# Patient Record
Sex: Male | Born: 1935 | Race: White | Hispanic: No | Marital: Married | Smoking: Former smoker
Health system: Southern US, Community
[De-identification: ages and names within clinical notes are randomized; demographics above are authoritative.]

## PROBLEM LIST (undated history)

## (undated) DIAGNOSIS — I639 Cerebral infarction, unspecified: Secondary | ICD-10-CM

## (undated) DIAGNOSIS — I10 Essential (primary) hypertension: Secondary | ICD-10-CM

---
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
## 2004-06-29 ENCOUNTER — Ambulatory Visit: Payer: Self-pay | Admitting: Gastroenterology

## 2007-03-14 ENCOUNTER — Ambulatory Visit: Payer: Self-pay | Admitting: Internal Medicine

## 2011-02-11 ENCOUNTER — Inpatient Hospital Stay: Payer: Self-pay | Admitting: Internal Medicine

## 2011-02-18 ENCOUNTER — Emergency Department: Payer: Self-pay | Admitting: *Deleted

## 2011-02-23 ENCOUNTER — Emergency Department: Payer: Self-pay | Admitting: Emergency Medicine

## 2011-04-05 ENCOUNTER — Ambulatory Visit: Payer: Self-pay | Admitting: Gastroenterology

## 2011-06-06 DIAGNOSIS — I639 Cerebral infarction, unspecified: Secondary | ICD-10-CM

## 2011-06-06 HISTORY — DX: Cerebral infarction, unspecified: I63.9

## 2011-07-03 ENCOUNTER — Ambulatory Visit: Payer: Self-pay | Admitting: Internal Medicine

## 2011-07-12 ENCOUNTER — Emergency Department: Payer: Self-pay | Admitting: Emergency Medicine

## 2011-07-12 LAB — URINALYSIS, COMPLETE
Ketone: NEGATIVE
Nitrite: NEGATIVE
Ph: 6 (ref 4.5–8.0)
Protein: NEGATIVE
RBC,UR: 23 /HPF (ref 0–5)
Specific Gravity: 1.01 (ref 1.003–1.030)
Squamous Epithelial: <1

## 2011-07-12 LAB — COMPREHENSIVE METABOLIC PANEL
Albumin: 3.3 g/dL — ABNORMAL LOW (ref 3.4–5.0)
Alkaline Phosphatase: 55 U/L (ref 50–136)
Anion Gap: 9 (ref 7–16)
Bilirubin,Total: 0.6 mg/dL (ref 0.2–1.0)
Chloride: 107 mmol/L (ref 98–107)
Co2: 26 mmol/L (ref 21–32)
Creatinine: 1.17 mg/dL (ref 0.60–1.30)
EGFR (Non-African Amer.): >60
Glucose: 99 mg/dL (ref 65–99)
SGPT (ALT): 21 U/L
Sodium: 142 mmol/L (ref 136–145)
Total Protein: 6.1 g/dL — ABNORMAL LOW (ref 6.4–8.2)

## 2011-07-12 LAB — CBC
MCV: 92 fL (ref 80–100)
Platelet: 229 10*3/uL (ref 150–440)
RDW: 13.1 % (ref 11.5–14.5)

## 2011-07-12 LAB — TROPONIN I: Troponin-I: <0.02 ng/mL

## 2011-07-18 ENCOUNTER — Ambulatory Visit: Payer: Self-pay | Admitting: Internal Medicine

## 2011-12-27 DIAGNOSIS — R109 Unspecified abdominal pain: Secondary | ICD-10-CM | POA: Insufficient documentation

## 2012-01-01 ENCOUNTER — Ambulatory Visit: Payer: Self-pay | Admitting: Internal Medicine

## 2012-03-29 ENCOUNTER — Ambulatory Visit: Payer: Self-pay | Admitting: Internal Medicine

## 2012-07-26 ENCOUNTER — Ambulatory Visit: Payer: Self-pay | Admitting: Internal Medicine

## 2012-07-26 DIAGNOSIS — L309 Dermatitis, unspecified: Secondary | ICD-10-CM | POA: Insufficient documentation

## 2012-10-10 ENCOUNTER — Ambulatory Visit: Payer: Self-pay | Admitting: Internal Medicine

## 2012-11-08 ENCOUNTER — Ambulatory Visit: Payer: Self-pay | Admitting: Internal Medicine

## 2013-01-10 DIAGNOSIS — I10 Essential (primary) hypertension: Secondary | ICD-10-CM | POA: Insufficient documentation

## 2013-03-05 HISTORY — PX: ESOPHAGOGASTRODUODENOSCOPY ENDOSCOPY: SHX5814

## 2013-03-19 DIAGNOSIS — R1310 Dysphagia, unspecified: Secondary | ICD-10-CM | POA: Insufficient documentation

## 2013-03-25 ENCOUNTER — Ambulatory Visit: Payer: Self-pay | Admitting: Gastroenterology

## 2013-04-09 DIAGNOSIS — H9190 Unspecified hearing loss, unspecified ear: Secondary | ICD-10-CM | POA: Insufficient documentation

## 2013-05-15 ENCOUNTER — Ambulatory Visit: Payer: Self-pay | Admitting: Urology

## 2013-11-10 DIAGNOSIS — M19019 Primary osteoarthritis, unspecified shoulder: Secondary | ICD-10-CM | POA: Insufficient documentation

## 2013-11-10 DIAGNOSIS — M7580 Other shoulder lesions, unspecified shoulder: Secondary | ICD-10-CM | POA: Insufficient documentation

## 2016-01-26 ENCOUNTER — Telehealth: Payer: Self-pay | Admitting: Gastroenterology

## 2016-01-26 ENCOUNTER — Encounter: Payer: Self-pay | Admitting: Gastroenterology

## 2016-01-26 NOTE — Telephone Encounter (Signed)
Called patient, LVM

## 2016-01-26 NOTE — Telephone Encounter (Signed)
EGD

## 2016-01-26 NOTE — Telephone Encounter (Signed)
error 

## 2016-01-26 NOTE — Telephone Encounter (Signed)
Patient is returning your call regarding an EGD and would prefer [place] if possible.

## 2016-01-27 ENCOUNTER — Other Ambulatory Visit: Payer: Self-pay

## 2016-01-27 DIAGNOSIS — I639 Cerebral infarction, unspecified: Secondary | ICD-10-CM | POA: Insufficient documentation

## 2016-01-27 DIAGNOSIS — N200 Calculus of kidney: Secondary | ICD-10-CM | POA: Insufficient documentation

## 2016-01-27 DIAGNOSIS — E7800 Pure hypercholesterolemia, unspecified: Secondary | ICD-10-CM | POA: Insufficient documentation

## 2016-01-27 DIAGNOSIS — I10 Essential (primary) hypertension: Secondary | ICD-10-CM | POA: Insufficient documentation

## 2016-01-27 DIAGNOSIS — R918 Other nonspecific abnormal finding of lung field: Secondary | ICD-10-CM | POA: Insufficient documentation

## 2016-01-27 NOTE — Telephone Encounter (Signed)
Gastroenterology Pre-Procedure Review  Request Date: 02/24/2016 Requesting Physician:   PATIENT REVIEW QUESTIONS: The patient responded to the following health history questions as indicated:    1. Are you having any GI issues? no 2. Do you have a personal history of Polyps? no 3. Do you have a family history of Colon Cancer or Polyps? no 4. Diabetes Mellitus? no 5. Joint replacements in the past 12 months?no 6. Major health problems in the past 3 months?no 7. Any artificial heart valves, MVP, or defibrillator?no    MEDICATIONS & ALLERGIES:    Patient reports the following regarding taking any anticoagulation/antiplatelet therapy:   Plavix, Coumadin, Eliquis, Xarelto, Lovenox, Pradaxa, Brilinta, or Effient? no Aspirin? yes (Blood thinner)  Patient confirms/reports the following medications:  Current Outpatient Prescriptions  Medication Sig Dispense Refill  . aspirin EC 81 MG tablet Take by mouth.    Marland Kitchen atorvastatin (LIPITOR) 10 MG tablet Take by mouth.    . desoximetasone (TOPICORT) 0.25 % cream Apply 1 application topically 2 (two) times daily.    . furosemide (LASIX) 20 MG tablet Take 20 mg by mouth.    . loratadine (CLARITIN) 5 MG chewable tablet Chew by mouth.    . metoprolol tartrate (LOPRESSOR) 25 MG tablet Take by mouth.    . potassium chloride (KLOR-CON) 20 MEQ packet Take by mouth 2 (two) times daily.    . vitamin B-12 (CYANOCOBALAMIN) 100 MCG tablet Take by mouth.     No current facility-administered medications for this visit.     Patient confirms/reports the following allergies:  Allergies  Allergen Reactions  . Finasteride Nausea Only    confusion  . Lisinopril Other (See Comments)    Confusion    No orders of the defined types were placed in this encounter.   AUTHORIZATION INFORMATION Primary Insurance: 1D#: Group #:  Secondary Insurance: 1D#: Group #:  SCHEDULE INFORMATION: Date: 02/24/2016 Time: Location: [facility]

## 2016-01-27 NOTE — Telephone Encounter (Signed)
Dysphagia R13.10 [facility] 02/24/2016 Please pre-cert

## 2016-01-27 NOTE — Telephone Encounter (Signed)
Patient returning your call regarding an appointment

## 2016-02-17 ENCOUNTER — Encounter: Payer: Self-pay | Admitting: *Deleted

## 2016-02-22 NOTE — Discharge Instructions (Signed)

## 2016-02-24 ENCOUNTER — Ambulatory Visit
Admission: RE | Admit: 2016-02-24 | Discharge: 2016-02-24 | Disposition: A | Discharge status: Home / Self Care | Payer: Medicare Other | Source: Ambulatory Visit | Attending: Gastroenterology | Admitting: Gastroenterology

## 2016-02-24 ENCOUNTER — Encounter: Payer: Self-pay | Admitting: *Deleted

## 2016-02-24 ENCOUNTER — Ambulatory Visit: Payer: Medicare Other | Admitting: Student in an Organized Health Care Education/Training Program

## 2016-02-24 ENCOUNTER — Encounter
Admission: RE | Disposition: A | Discharge status: Home / Self Care | Payer: Self-pay | Source: Ambulatory Visit | Attending: Gastroenterology

## 2016-02-24 DIAGNOSIS — K222 Esophageal obstruction: Secondary | ICD-10-CM

## 2016-02-24 DIAGNOSIS — I10 Essential (primary) hypertension: Secondary | ICD-10-CM | POA: Insufficient documentation

## 2016-02-24 DIAGNOSIS — K449 Diaphragmatic hernia without obstruction or gangrene: Secondary | ICD-10-CM | POA: Diagnosis not present

## 2016-02-24 DIAGNOSIS — R1310 Dysphagia, unspecified: Secondary | ICD-10-CM | POA: Insufficient documentation

## 2016-02-24 DIAGNOSIS — Z79899 Other long term (current) drug therapy: Secondary | ICD-10-CM | POA: Diagnosis not present

## 2016-02-24 DIAGNOSIS — Z8673 Personal history of transient ischemic attack (TIA), and cerebral infarction without residual deficits: Secondary | ICD-10-CM | POA: Diagnosis not present

## 2016-02-24 DIAGNOSIS — Z7982 Long term (current) use of aspirin: Secondary | ICD-10-CM | POA: Diagnosis not present

## 2016-02-24 HISTORY — DX: Cerebral infarction, unspecified: I63.9

## 2016-02-24 HISTORY — PX: ESOPHAGEAL DILATION: SHX303

## 2016-02-24 HISTORY — DX: Essential (primary) hypertension: I10

## 2016-02-24 HISTORY — PX: ESOPHAGOGASTRODUODENOSCOPY (EGD) WITH PROPOFOL: SHX5813

## 2016-02-24 SURGERY — ESOPHAGOGASTRODUODENOSCOPY (EGD) WITH PROPOFOL
Anesthesia: Monitor Anesthesia Care | Wound class: Clean Contaminated

## 2016-02-24 MED ORDER — LACTATED RINGERS IV SOLN
INTRAVENOUS | Status: DC
  Administered 2016-02-24: 12:00:00 via INTRAVENOUS

## 2016-02-24 MED ORDER — PROPOFOL 10 MG/ML IV BOLUS
INTRAVENOUS | Status: DC | PRN
  Administered 2016-02-24: 20 mg via INTRAVENOUS
  Administered 2016-02-24: 50 mg via INTRAVENOUS
  Administered 2016-02-24: 30 mg via INTRAVENOUS
  Administered 2016-02-24: 10 mg via INTRAVENOUS
  Administered 2016-02-24: 20 mg via INTRAVENOUS

## 2016-02-24 MED ORDER — LIDOCAINE HCL (CARDIAC) 20 MG/ML IV SOLN
INTRAVENOUS | Status: DC | PRN
  Administered 2016-02-24: 50 mg via INTRAVENOUS

## 2016-02-24 MED ORDER — OXYCODONE HCL 5 MG/5ML PO SOLN: 5.0000 mg | Freq: Once | ORAL | Status: DC | PRN

## 2016-02-24 MED ORDER — GLYCOPYRROLATE 0.2 MG/ML IJ SOLN
INTRAMUSCULAR | Status: DC | PRN
  Administered 2016-02-24: 0.2 mg via INTRAVENOUS

## 2016-02-24 MED ORDER — OXYCODONE HCL 5 MG PO TABS: 5.0000 mg | ORAL_TABLET | Freq: Once | ORAL | Status: DC | PRN

## 2016-02-24 SURGICAL SUPPLY — 32 items
BALLN DILATOR 10-12 8 (BALLOONS)
BALLN DILATOR 12-15 8 (BALLOONS)
BALLN DILATOR 15-18 8 (BALLOONS) ×4
BALLN DILATOR CRE 0-12 8 (BALLOONS)
BALLN DILATOR ESOPH 8 10 CRE (MISCELLANEOUS) IMPLANT
BALLOON DILATOR 12-15 8 (BALLOONS) IMPLANT
BALLOON DILATOR 15-18 8 (BALLOONS) ×2 IMPLANT
BALLOON DILATOR CRE 0-12 8 (BALLOONS) IMPLANT
BLOCK BITE 60FR ADLT L/F GRN (MISCELLANEOUS) ×4 IMPLANT
CANISTER SUCT 1200ML W/VALVE (MISCELLANEOUS) ×4 IMPLANT
CLIP HMST 235XBRD CATH ROT (MISCELLANEOUS) IMPLANT
CLIP RESOLUTION 360 11X235 (MISCELLANEOUS)
FCP ESCP3.2XJMB 240X2.8X (MISCELLANEOUS)
FORCEPS BIOP RAD 4 LRG CAP 4 (CUTTING FORCEPS) IMPLANT
FORCEPS BIOP RJ4 240 W/NDL (MISCELLANEOUS)
FORCEPS ESCP3.2XJMB 240X2.8X (MISCELLANEOUS) IMPLANT
GOWN CVR UNV OPN BCK APRN NK (MISCELLANEOUS) ×4 IMPLANT
GOWN ISOL THUMB LOOP REG UNIV (MISCELLANEOUS) ×4
INJECTOR VARIJECT VIN23 (MISCELLANEOUS) IMPLANT
KIT DEFENDO VALVE AND CONN (KITS) IMPLANT
KIT ENDO PROCEDURE OLY (KITS) ×4 IMPLANT
MARKER SPOT ENDO TATTOO 5ML (MISCELLANEOUS) IMPLANT
PAD GROUND ADULT SPLIT (MISCELLANEOUS) IMPLANT
RETRIEVER NET PLAT FOOD (MISCELLANEOUS) IMPLANT
SNARE SHORT THROW 13M SML OVAL (MISCELLANEOUS) IMPLANT
SNARE SHORT THROW 30M LRG OVAL (MISCELLANEOUS) IMPLANT
SPOT EX ENDOSCOPIC TATTOO (MISCELLANEOUS)
SYR INFLATION 60ML (SYRINGE) ×4 IMPLANT
TRAP ETRAP POLY (MISCELLANEOUS) IMPLANT
VARIJECT INJECTOR VIN23 (MISCELLANEOUS)
WATER STERILE IRR 250ML POUR (IV SOLUTION) ×4 IMPLANT
WIRE CRE 18-20MM 8CM F G (MISCELLANEOUS) IMPLANT

## 2016-02-24 NOTE — Anesthesia Preprocedure Evaluation (Signed)
Anesthesia Evaluation  Patient identified by MRN, date of birth, ID band  Reviewed: NPO status   History of Anesthesia Complications Negative for: history of anesthetic complications  Airway Mallampati: II  TM Distance: >3 FB Neck ROM: full    Dental  (+) Missing,    Pulmonary  PTX 1953 > thoracotomy;   Pulmonary exam normal        Cardiovascular hypertension, Normal cardiovascular exam     Neuro/Psych HOHearing TIA (2014 )negative psych ROS   GI/Hepatic negative GI ROS, Neg liver ROS,   Endo/Other  negative endocrine ROS  Renal/GU negative Renal ROS  negative genitourinary   Musculoskeletal   Abdominal   Peds  Hematology negative hematology ROS (+)   Anesthesia Other Findings   Reproductive/Obstetrics                             Anesthesia Physical Anesthesia Plan  ASA: II  Anesthesia Plan: MAC   Post-op Pain Management:    Induction:   Airway Management Planned:   Additional Equipment:   Intra-op Plan:   Post-operative Plan:   Informed Consent: I have reviewed the patients History and Physical, chart, labs and discussed the procedure including the risks, benefits and alternatives for the proposed anesthesia with the patient or authorized representative who has indicated his/her understanding and acceptance.     Plan Discussed with: CRNA  Anesthesia Plan Comments:         Anesthesia Quick Evaluation

## 2016-02-24 NOTE — H&P (Signed)
  Midge Minium, MD [facility] [address], [address] [place], [place] [address] Phone: [phone] Fax : [phone]  Primary Care Physician:  Rolm Gala, MD Primary Gastroenterologist:  Dr. Servando Snare  Pre-Procedure History & Physical: HPI:  Matthew Greer is a 82 y.o. male is here for an endoscopy.   Past Medical History:  Diagnosis Date  . Hypertension   . Stroke [facility]) 2013   "minor" - no deficits    Past Surgical History:  Procedure Laterality Date  . ESOPHAGOGASTRODUODENOSCOPY ENDOSCOPY  03/2013   Dr. Servando Snare  . THORACOTOMY  1953    Prior to Admission medications   Medication Sig Start Date End Date Taking? Authorizing Provider  acetaminophen (TYLENOL) 325 MG tablet Take 650 mg by mouth every 6 (six) hours as needed.   Yes Historical Provider, MD  aspirin EC 81 MG tablet Take by mouth.   Yes Historical Provider, MD  atorvastatin (LIPITOR) 10 MG tablet Take by mouth. 06/10/15 06/09/16 Yes Historical Provider, MD  desoximetasone (TOPICORT) 0.25 % cream Apply 1 application topically 2 (two) times daily.   Yes Historical Provider, MD  furosemide (LASIX) 20 MG tablet Take 20 mg by mouth.   Yes Historical Provider, MD  loratadine (CLARITIN) 5 MG chewable tablet Chew by mouth.   Yes Historical Provider, MD  metoprolol tartrate (LOPRESSOR) 25 MG tablet Take by mouth. 06/18/15 06/09/16 Yes Historical Provider, MD  potassium chloride (KLOR-CON) 20 MEQ packet Take by mouth 2 (two) times daily.   Yes Historical Provider, MD    Allergies as of 01/27/2016 - Review Complete 01/27/2016  Allergen Reaction Noted  . Finasteride Nausea Only 10/21/2013  . Lisinopril Other (See Comments) 11/10/2013    History reviewed. No pertinent family history.  Social History   Social History  . Marital status: Married    Spouse name: N/A  . Number of children: N/A  . Years of education: N/A   Occupational History  . Not on file.   Social History Main Topics  . Smoking status: Never Smoker  . Smokeless  tobacco: Never Used  . Alcohol use No  . Drug use: Unknown  . Sexual activity: Not on file   Other Topics Concern  . Not on file   Social History Narrative  . No narrative on file    Review of Systems: See HPI, otherwise negative ROS  Physical Exam: BP (!) 140/103   Pulse 85   Temp 98.2 F (36.8 C)   Resp 16   Ht 5\' 11"  (1.803 m)   Wt 188 lb (85.3 kg)   SpO2 96%   BMI 26.22 kg/m  General:   Alert,  pleasant and cooperative in NAD Head:  Normocephalic and atraumatic. Neck:  Supple; no masses or thyromegaly. Lungs:  Clear throughout to auscultation.    Heart:  Regular rate and rhythm. Abdomen:  Soft, nontender and nondistended. Normal bowel sounds, without guarding, and without rebound.   Neurologic:  Alert and  oriented x4;  grossly normal neurologically.  Impression/Plan: Matthew Greer is here for an endoscopy to be performed for Dysphagia  Risks, benefits, limitations, and alternatives regarding  endoscopy have been reviewed with the patient.  Questions have been answered.  All parties agreeable.   Midge Minium, MD  02/24/2016, 11:57 AM

## 2016-02-24 NOTE — Transfer of Care (Signed)
Immediate Anesthesia Transfer of Care Note  Patient: Matthew Greer  Procedure(s) Performed: Procedure(s): ESOPHAGOGASTRODUODENOSCOPY (EGD) WITH PROPOFOL (N/A) ESOPHAGEAL DILATION  Patient Location: PACU  Anesthesia Type: MAC  Level of Consciousness: awake, alert  and patient cooperative  Airway and Oxygen Therapy: Patient Spontanous Breathing and Patient connected to supplemental oxygen  Post-op Assessment: Post-op Vital signs reviewed, Patient's Cardiovascular Status Stable, Respiratory Function Stable, Patent Airway and No signs of Nausea or vomiting  Post-op Vital Signs: Reviewed and stable  Complications: No apparent anesthesia complications

## 2016-02-24 NOTE — Op Note (Signed)
[facility] Patient Name: Matthew Greer Procedure Date: 02/24/2016 11:57 AM MRN: [id number] Account #: [id number] Date of Birth: 1925/11/14 Admit Type: Outpatient Age: 82 Room: [facility] OR ROOM 01 Gender: Male Note Status: Finalized Procedure:            Upper GI endoscopy Indications:          Dysphagia Providers:            Midge Minium MD, MD Referring MD:         Letitia Caul, MD (Referring MD) Medicines:            Propofol per Anesthesia Complications:        No immediate complications. Procedure:            Pre-Anesthesia Assessment:                       - Prior to the procedure, a History and Physical was                        performed, and patient medications and allergies were                        reviewed. The patient's tolerance of previous                        anesthesia was also reviewed. The risks and benefits of                        the procedure and the sedation options and risks were                        discussed with the patient. All questions were                        answered, and informed consent was obtained. Prior                        Anticoagulants: The patient has taken no previous                        anticoagulant or antiplatelet agents. ASA Grade                        Assessment: II - A patient with mild systemic disease.                        After reviewing the risks and benefits, the patient was                        deemed in satisfactory condition to undergo the                        procedure.                       After obtaining informed consent, the endoscope was                        passed under direct vision. Throughout the procedure,  the patient's blood pressure, pulse, and oxygen                        saturations were monitored continuously. The Olympus                        [id number]-HQ190 Endoscope (S#. [id number]) was introduced                        through the  mouth, and advanced to the second part of                        duodenum. The upper GI endoscopy was accomplished                        without difficulty. The patient tolerated the procedure                        well. Findings:      One moderate benign-appearing, intrinsic stenosis was found at the       gastroesophageal junction. And was traversed. A TTS dilator was passed       through the scope. Dilation with a 15-16.5-18 mm balloon dilator was       performed to 16.5 mm. The dilation site was examined following endoscope       reinsertion and showed moderate improvement in luminal narrowing.      A medium-sized hiatal hernia was present.      The stomach was normal.      The examined duodenum was normal. Impression:           - Benign-appearing esophageal stenosis. Dilated.                       - Medium-sized hiatal hernia.                       - Normal stomach.                       - Normal examined duodenum.                       - No specimens collected. Recommendation:       - Discharge patient to home.                       - Resume previous diet.                       - Continue present medications.                       - Await pathology results. Procedure Code(s):    --- Professional ---                       [phone], Esophagogastroduodenoscopy, flexible, transoral;                        with transendoscopic balloon dilation of esophagus                        (less than 30 mm diameter) Diagnosis Code(s):    --- Professional ---  R13.10, Dysphagia, unspecified                       K22.2, Esophageal obstruction CPT copyright 2016 American Medical Association. All rights reserved. The codes documented in this report are preliminary and upon coder review may  be revised to meet current compliance requirements. Midge Miniumarren Luka Reisch MD, MD 02/24/2016 12:14:18 PM This report has been signed electronically. Number of Addenda: 0 Note Initiated On: 02/24/2016  11:57 AM      [facility]

## 2016-02-24 NOTE — Anesthesia Postprocedure Evaluation (Signed)
Anesthesia Post Note  Patient: Matthew Greer  Procedure(s) Performed: Procedure(s) (LRB): ESOPHAGOGASTRODUODENOSCOPY (EGD) WITH PROPOFOL (N/A) ESOPHAGEAL DILATION  Patient location during evaluation: PACU Anesthesia Type: MAC Level of consciousness: awake and alert Pain management: pain level controlled Vital Signs Assessment: post-procedure vital signs reviewed and stable Respiratory status: spontaneous breathing, nonlabored ventilation, respiratory function stable and patient connected to nasal cannula oxygen Cardiovascular status: stable and blood pressure returned to baseline Anesthetic complications: no    Casara Perrier

## 2016-02-24 NOTE — Anesthesia Procedure Notes (Signed)
Procedure Name: MAC Date/Time: 02/24/2016 12:01 PM Performed by: Maryan Rued Pre-anesthesia Checklist: Patient identified, Emergency Drugs available, Suction available and Patient being monitored Patient Re-evaluated:Patient Re-evaluated prior to inductionOxygen Delivery Method: Nasal cannula

## 2016-02-25 ENCOUNTER — Encounter: Payer: Self-pay | Admitting: Gastroenterology

## 2016-05-25 ENCOUNTER — Emergency Department: Payer: Medicare Other

## 2016-05-25 ENCOUNTER — Inpatient Hospital Stay
Admission: EM | Admit: 2016-05-25 | Discharge: 2016-05-28 | DRG: 066 | Disposition: A | Discharge status: Skilled Nursing Facility | Payer: Medicare Other | Attending: Internal Medicine | Admitting: Internal Medicine

## 2016-05-25 ENCOUNTER — Encounter: Payer: Self-pay | Admitting: *Deleted

## 2016-05-25 DIAGNOSIS — Z823 Family history of stroke: Secondary | ICD-10-CM | POA: Diagnosis not present

## 2016-05-25 DIAGNOSIS — Z888 Allergy status to other drugs, medicaments and biological substances status: Secondary | ICD-10-CM

## 2016-05-25 DIAGNOSIS — I10 Essential (primary) hypertension: Secondary | ICD-10-CM | POA: Diagnosis present

## 2016-05-25 DIAGNOSIS — R278 Other lack of coordination: Secondary | ICD-10-CM

## 2016-05-25 DIAGNOSIS — Z7982 Long term (current) use of aspirin: Secondary | ICD-10-CM

## 2016-05-25 DIAGNOSIS — Z8673 Personal history of transient ischemic attack (TIA), and cerebral infarction without residual deficits: Secondary | ICD-10-CM | POA: Diagnosis not present

## 2016-05-25 DIAGNOSIS — I69359 Hemiplegia and hemiparesis following cerebral infarction affecting unspecified side: Secondary | ICD-10-CM

## 2016-05-25 DIAGNOSIS — E785 Hyperlipidemia, unspecified: Secondary | ICD-10-CM | POA: Diagnosis present

## 2016-05-25 DIAGNOSIS — Z8249 Family history of ischemic heart disease and other diseases of the circulatory system: Secondary | ICD-10-CM | POA: Diagnosis not present

## 2016-05-25 DIAGNOSIS — I63312 Cerebral infarction due to thrombosis of left middle cerebral artery: Secondary | ICD-10-CM | POA: Diagnosis not present

## 2016-05-25 DIAGNOSIS — R29898 Other symptoms and signs involving the musculoskeletal system: Secondary | ICD-10-CM | POA: Diagnosis not present

## 2016-05-25 DIAGNOSIS — R29701 NIHSS score 1: Secondary | ICD-10-CM | POA: Diagnosis present

## 2016-05-25 DIAGNOSIS — R4781 Slurred speech: Secondary | ICD-10-CM | POA: Diagnosis present

## 2016-05-25 DIAGNOSIS — I639 Cerebral infarction, unspecified: Principal | ICD-10-CM | POA: Diagnosis present

## 2016-05-25 DIAGNOSIS — M6281 Muscle weakness (generalized): Secondary | ICD-10-CM

## 2016-05-25 DIAGNOSIS — R4701 Aphasia: Secondary | ICD-10-CM | POA: Diagnosis present

## 2016-05-25 DIAGNOSIS — Z87891 Personal history of nicotine dependence: Secondary | ICD-10-CM | POA: Diagnosis not present

## 2016-05-25 LAB — APTT: aPTT: 35 seconds (ref 24–36)

## 2016-05-25 LAB — COMPREHENSIVE METABOLIC PANEL
ALK PHOS: 76 U/L (ref 38–126)
ALT: 17 U/L (ref 17–63)
ANION GAP: 5 (ref 5–15)
AST: 22 U/L (ref 15–41)
Albumin: 4.1 g/dL (ref 3.5–5.0)
BUN: 21 mg/dL — ABNORMAL HIGH (ref 6–20)
CHLORIDE: 106 mmol/L (ref 101–111)
CO2: 28 mmol/L (ref 22–32)
Calcium: 9.8 mg/dL (ref 8.9–10.3)
Creatinine, Ser: 1.28 mg/dL — ABNORMAL HIGH (ref 0.61–1.24)
GFR calc non Af Amer: 48 mL/min — ABNORMAL LOW (ref >60)
GFR, EST AFRICAN AMERICAN: 56 mL/min — AB (ref >60)
GLUCOSE: 100 mg/dL — AB (ref 65–99)
Potassium: 4.1 mmol/L (ref 3.5–5.1)
SODIUM: 139 mmol/L (ref 135–145)
Total Bilirubin: 0.4 mg/dL (ref 0.3–1.2)
Total Protein: 6.7 g/dL (ref 6.5–8.1)

## 2016-05-25 LAB — TROPONIN I: Troponin I: <0.03 ng/mL (ref <0.03)

## 2016-05-25 LAB — DIFFERENTIAL
BASOS PCT: 1 %
Basophils Absolute: 0.1 10*3/uL (ref 0–0.1)
EOS PCT: 1 %
Eosinophils Absolute: 0.1 10*3/uL (ref 0–0.7)
LYMPHS PCT: 23 %
Lymphs Abs: 2.3 10*3/uL (ref 1.0–3.6)
MONO ABS: 0.8 10*3/uL (ref 0.2–1.0)
Monocytes Relative: 8 %
NEUTROS ABS: 6.5 10*3/uL (ref 1.4–6.5)
NEUTROS PCT: 67 %

## 2016-05-25 LAB — GLUCOSE, CAPILLARY: Glucose-Capillary: 100 mg/dL — ABNORMAL HIGH (ref 65–99)

## 2016-05-25 LAB — CBC
HCT: 42.9 % (ref 40.0–52.0)
Hemoglobin: 14.3 g/dL (ref 13.0–18.0)
MCH: 30.2 pg (ref 26.0–34.0)
MCHC: 33.4 g/dL (ref 32.0–36.0)
MCV: 90.4 fL (ref 80.0–100.0)
PLATELETS: 272 10*3/uL (ref 150–440)
RBC: 4.74 MIL/uL (ref 4.40–5.90)
RDW: 13.6 % (ref 11.5–14.5)
WBC: 9.8 10*3/uL (ref 3.8–10.6)

## 2016-05-25 LAB — ETHANOL: Alcohol, Ethyl (B): <5 mg/dL (ref <5)

## 2016-05-25 LAB — PROTIME-INR
INR: 0.98
PROTHROMBIN TIME: 13 s (ref 11.4–15.2)

## 2016-05-25 MED ORDER — ACETAMINOPHEN 650 MG RE SUPP: 650.0000 mg | RECTAL | Status: DC | PRN

## 2016-05-25 MED ORDER — ENOXAPARIN SODIUM 40 MG/0.4ML ~~LOC~~ SOLN
40.0000 mg | SUBCUTANEOUS | Status: DC
  Administered 2016-05-25 – 2016-05-27 (×3): 40 mg via SUBCUTANEOUS
  Filled 2016-05-25 (×3): qty 0.4

## 2016-05-25 MED ORDER — LORATADINE 10 MG PO TABS
5.0000 mg | ORAL_TABLET | Freq: Every day | ORAL | Status: DC
  Administered 2016-05-26 – 2016-05-28 (×3): 5 mg via ORAL
  Filled 2016-05-25 (×3): qty 1

## 2016-05-25 MED ORDER — ASPIRIN EC 81 MG PO TBEC
81.0000 mg | DELAYED_RELEASE_TABLET | Freq: Every day | ORAL | Status: DC
  Administered 2016-05-26 – 2016-05-28 (×3): 81 mg via ORAL
  Filled 2016-05-25 (×3): qty 1

## 2016-05-25 MED ORDER — ACETAMINOPHEN 325 MG PO TABS: 650.0000 mg | ORAL_TABLET | Freq: Four times a day (QID) | ORAL | Status: DC | PRN

## 2016-05-25 MED ORDER — CLOPIDOGREL BISULFATE 75 MG PO TABS
75.0000 mg | ORAL_TABLET | Freq: Every day | ORAL | Status: DC
  Administered 2016-05-25 – 2016-05-28 (×4): 75 mg via ORAL
  Filled 2016-05-25 (×4): qty 1

## 2016-05-25 MED ORDER — METOPROLOL TARTRATE 25 MG PO TABS
25.0000 mg | ORAL_TABLET | Freq: Two times a day (BID) | ORAL | Status: DC
  Administered 2016-05-25 – 2016-05-26 (×3): 25 mg via ORAL
  Filled 2016-05-25 (×3): qty 1

## 2016-05-25 MED ORDER — ATORVASTATIN CALCIUM 20 MG PO TABS
80.0000 mg | ORAL_TABLET | Freq: Every day | ORAL | Status: DC
  Administered 2016-05-26 – 2016-05-27 (×2): 80 mg via ORAL
  Filled 2016-05-25 (×2): qty 4

## 2016-05-25 MED ORDER — ACETAMINOPHEN 160 MG/5ML PO SOLN: 650.0000 mg | ORAL | Status: DC | PRN

## 2016-05-25 MED ORDER — ACETAMINOPHEN 325 MG PO TABS: 650.0000 mg | ORAL_TABLET | ORAL | Status: DC | PRN

## 2016-05-25 MED ORDER — SENNOSIDES-DOCUSATE SODIUM 8.6-50 MG PO TABS: 1.0000 | ORAL_TABLET | Freq: Every evening | ORAL | Status: DC | PRN

## 2016-05-25 MED ORDER — STROKE: EARLY STAGES OF RECOVERY BOOK
Freq: Once | Status: AC
  Administered 2016-05-25: 1

## 2016-05-25 NOTE — H&P (Signed)
[employer] - [place] [facility]   PATIENT NAME: Matthew Greer    MR#:  [id number]  DATE OF BIRTH:  [birth year]  DATE OF ADMISSION:  05/25/2016  PRIMARY CARE PHYSICIAN: Rolm Gala, MD   REQUESTING/REFERRING PHYSICIAN: Roxan Hockey MD  CHIEF COMPLAINT:   Chief Complaint  Patient presents with  . Code Stroke    HISTORY OF PRESENT ILLNESS: Matthew Greer  is a 82 y.o. male with a known history of " Minor strokes" was presented to the ED earlier with acute onset of weakness in his upper extremities and right sided worse. Also slurred speech. His symptoms were not severe. Therefore a neurology consult was obtained they recommended a MRI of the brain which confirmed a small CVA. Patient continues to complain of weakness in his upper extremities. Some slurred speech. He otherwise denies any difficulty with swallowing no chest pain or shortness of breath.     AST MEDICAL HISTORY:   Past Medical History:  Diagnosis Date  . Hypertension   . Stroke [facility]) 2013   "minor" - no deficits    PAST SURGICAL HISTORY: Past Surgical History:  Procedure Laterality Date  . ESOPHAGEAL DILATION  02/24/2016   Procedure: ESOPHAGEAL DILATION;  Surgeon: Midge Minium, MD;  Location: [facility];  Service: Endoscopy;;  . ESOPHAGOGASTRODUODENOSCOPY (EGD) WITH PROPOFOL N/A 02/24/2016   Procedure: ESOPHAGOGASTRODUODENOSCOPY (EGD) WITH PROPOFOL;  Surgeon: Midge Minium, MD;  Location: [facility];  Service: Endoscopy;  Laterality: N/A;  . ESOPHAGOGASTRODUODENOSCOPY ENDOSCOPY  03/2013   Dr. Servando Snare  . THORACOTOMY  1953    SOCIAL HISTORY:  Social History  Substance Use Topics  . Smoking status: Never Smoker  . Smokeless tobacco: Never Used  . Alcohol use No    FAMILY HISTORY: No family history on file.  DRUG ALLERGIES:  Allergies  Allergen Reactions  . Finasteride Nausea Only    confusion  . Lisinopril Other (See Comments)    Confusion    REVIEW OF SYSTEMS:    CONSTITUTIONAL: No fever, fatigue or weakness.  EYES: No blurred or double vision.  EARS, NOSE, AND THROAT: No tinnitus or ear pain.  RESPIRATORY: No cough, shortness of breath, wheezing or hemoptysis.  CARDIOVASCULAR: No chest pain, orthopnea, edema.  GASTROINTESTINAL: No nausea, vomiting, diarrhea or abdominal pain.  GENITOURINARY: No dysuria, hematuria.  ENDOCRINE: No polyuria, nocturia,  HEMATOLOGY: No anemia, easy bruising or bleeding SKIN: No rash or lesion. MUSCULOSKELETAL: No joint pain or arthritis.   NEUROLOGIC: No tingling, numbness, Upper extremityweakness.  Slurred speech PSYCHIATRY: No anxiety or depression.   MEDICATIONS AT HOME:  Prior to Admission medications   Medication Sig Start Date End Date Taking? Authorizing Provider  acetaminophen (TYLENOL) 325 MG tablet Take 650 mg by mouth every 6 (six) hours as needed.   Yes Historical Provider, MD  aspirin EC 81 MG tablet Take by mouth.   Yes Historical Provider, MD  atorvastatin (LIPITOR) 10 MG tablet Take by mouth. 06/10/15 06/09/16 Yes Historical Provider, MD  loratadine (CLARITIN) 5 MG chewable tablet Chew by mouth.   Yes Historical Provider, MD  metoprolol tartrate (LOPRESSOR) 25 MG tablet Take by mouth. 06/18/15 06/09/16 Yes Historical Provider, MD      PHYSICAL EXAMINATION:   VITAL SIGNS: Blood pressure (!) 153/91, pulse 74, temperature 97.7 F (36.5 C), temperature source Oral, resp. rate 18, height 5\' 11"  (1.803 m), weight 183 lb (83 kg), SpO2 96 %.  GENERAL:  82 y.o.-year-old patient lying in the bed with no acute distress.  EYES:  Pupils equal, round, reactive to light and accommodation. No scleral icterus. Extraocular muscles intact.  HEENT: Head atraumatic, normocephalic. Oropharynx and nasopharynx clear.  NECK:  Supple, no jugular venous distention. No thyroid enlargement, no tenderness.  LUNGS: Normal breath sounds bilaterally, no wheezing, rales,rhonchi or crepitation. No use of accessory muscles of  respiration.  CARDIOVASCULAR: S1, S2 normal. No murmurs, rubs, or gallops.  ABDOMEN: Soft, nontender, nondistended. Bowel sounds present. No organomegaly or mass.  EXTREMITIES: No pedal edema, cyanosis, or clubbing.  NEUROLOGIC: Cranial nerves II through XII are intact. Muscle strength 4/5 in  upper extremity Sensation intact. Gait not checked.  PSYCHIATRIC: The patient is alert and oriented x 3.  SKIN: No obvious rash, lesion, or ulcer.   LABORATORY PANEL:   CBC  Recent Labs Lab 05/25/16 1258  WBC 9.8  HGB 14.3  HCT 42.9  PLT 272  MCV 90.4  MCH 30.2  MCHC 33.4  RDW 13.6  LYMPHSABS 2.3  MONOABS 0.8  EOSABS 0.1  BASOSABS 0.1   ------------------------------------------------------------------------------------------------------------------  Chemistries   Recent Labs Lab 05/25/16 1258  NA 139  K 4.1  CL 106  CO2 28  GLUCOSE 100*  BUN 21*  CREATININE 1.28*  CALCIUM 9.8  AST 22  ALT 17  ALKPHOS 76  BILITOT 0.4   ------------------------------------------------------------------------------------------------------------------ estimated creatinine clearance is 42.5 mL/min (by C-G formula based on SCr of 1.28 mg/dL (H)). ------------------------------------------------------------------------------------------------------------------ No results for input(s): TSH, T4TOTAL, T3FREE, THYROIDAB in the last 72 hours.  Invalid input(s): FREET3   Coagulation profile  Recent Labs Lab 05/25/16 1258  INR 0.98   ------------------------------------------------------------------------------------------------------------------- No results for input(s): DDIMER in the last 72 hours. -------------------------------------------------------------------------------------------------------------------  Cardiac Enzymes  Recent Labs Lab 05/25/16 1258  TROPONINI <0.03    ------------------------------------------------------------------------------------------------------------------ Invalid input(s): POCBNP  ---------------------------------------------------------------------------------------------------------------  Urinalysis    Component Value Date/Time   COLORURINE Yellow 07/12/2011 1236   APPEARANCEUR Clear 07/12/2011 1236   LABSPEC 1.010 07/12/2011 1236   PHURINE 6.0 07/12/2011 1236   GLUCOSEU Negative 07/12/2011 1236   HGBUR 2+ 07/12/2011 1236   BILIRUBINUR Negative 07/12/2011 1236   KETONESUR Negative 07/12/2011 1236   PROTEINUR Negative 07/12/2011 1236   NITRITE Negative 07/12/2011 1236   LEUKOCYTESUR Negative 07/12/2011 1236     RADIOLOGY: Mr Brain Wo Contrast  Result Date: 05/25/2016 CLINICAL DATA:  Slurred speech.  Right-sided weakness. EXAM: MRI HEAD WITHOUT CONTRAST TECHNIQUE: Multiplanar, multiecho pulse sequences of the brain and surrounding structures were obtained without intravenous contrast. COMPARISON:  CT 05/25/2016.  MRI 02/12/2011 FINDINGS: Brain: Small areas of restricted diffusion in the left internal capsule compatible with acute infarct. No other areas of acute infarct. Generalized atrophy without hydrocephalus. Progression of periventricular and deep white matter hyperintensities since 2012 most consistent with chronic microvascular ischemia. Negative for hemorrhage or mass. No shift of the midline structures. Vascular: Normal arterial flow voids. Skull and upper cervical spine: Negative Sinuses/Orbits: Mucosal edema paranasal sinuses with air-fluid level sphenoid sinus. Normal orbit. Other: None IMPRESSION: Small area of acute infarct left internal capsule Progressive chronic microvascular ischemic changes in the white matter since 2012. Electronically Signed   By: Marlan Palauharles  Clark M.D.   On: 05/25/2016 16:41   Dg Chest Portable 1 View  Result Date: 05/25/2016 CLINICAL DATA:  Acute onset of upper extremity weakness,  worse on the right. Slurred speech. EXAM: PORTABLE CHEST 1 VIEW COMPARISON:  CT 07/26/2012 and 01/01/2012.  Radiographs 07/12/2011. FINDINGS: 1438 hour. Lower lung volumes and mild patient rotation to the right. Allowing for this, cardiomegaly and  aortic atherosclerosis are grossly stable. There is mild bibasilar atelectasis, but no confluent airspace opacity or significant pleural effusion. Biapical pleural thickening appears unchanged. No acute osseous findings are seen. IMPRESSION: No definite acute findings allowing for suboptimal inspiration and patient rotation. Electronically Signed   By: Carey Bullocks M.D.   On: 05/25/2016 14:57   Ct Head Code Stroke W/o Cm  Result Date: 05/25/2016 CLINICAL DATA:  Code stroke. Stroke symptoms. Last seen normal 11 a.m. EXAM: CT HEAD WITHOUT CONTRAST TECHNIQUE: Contiguous axial images were obtained from the base of the skull through the vertex without intravenous contrast. COMPARISON:  CT head 07/12/2011 FINDINGS: Brain: Negative for acute infarct. Negative for acute hemorrhage or mass Generalized atrophy. Hypodensity in the white matter bilaterally similar to the prior study and consistent with chronic microvascular ischemia. Vascular: No hyperdense vessel or unexpected calcification. Skull: Negative Sinuses/Orbits: Mucosal edema paranasal sinuses.  No orbital lesion. Other: None ASPECTS (Alberta Stroke Program Early CT Score) - Ganglionic level infarction (caudate, lentiform nuclei, internal capsule, insula, M1-M3 cortex): 7 - Supraganglionic infarction (M4-M6 cortex): 3 Total score (0-10 with 10 being normal): 10 IMPRESSION: 1. No acute intracranial abnormality. Atrophy with chronic microvascular ischemia stable from the prior study. 2. ASPECTS is 10 These results were called by telephone at the time of interpretation on 05/25/2016 at 1:07 pm to Dr. Jene Every , who verbally acknowledged these results. Electronically Signed   By: Marlan Palau M.D.   On:  05/25/2016 13:08    EKG: Orders placed or performed during the hospital encounter of 05/25/16  . ED EKG  . ED EKG    IMPRESSION AND PLAN: Patient is a 82 year old noted to have acute stroke on MRI  1. Acute CVA Patient artery taking aspirin will add Plavix We'll obtain physical therapy evaluation Swallow eval of He ordered he had an MRI of the brain will order MRA of the brain High dose statin Check carotid Dopplers and echocardiogram of the heart  2. Essential hypertension continue metoprolol  3. Miscellaneous Lovenox for DVT prophylaxis  All the records are reviewed and case discussed with ED provider. Management plans discussed with the patient, family and they are in agreement.  CODE STATUS: Code Status History    This patient does not have a recorded code status. Please follow your organizational policy for patients in this situation.       TOTAL TIME TAKING CARE OF THIS PATIENT: 55 minutes.    Auburn Bilberry M.D on 05/25/2016 at 7:23 PM  Between 7am to 6pm - Pager - [phone]  After 6pm go to [email or web address] - password [email or web address] [facility]  [facility]  Office  [phone]  CC: Primary care physician; Rolm Gala, MD

## 2016-05-25 NOTE — Consult Note (Addendum)
Referring Physician: Cyril LoosenKinner     Chief Complaint: Upper extremity weakness  HPI: Matthew Greer is an 82 y.o. male who reports that today he had acute onset weakness that he noted in his upper extremities, right mostly.  Also noted slurred speech.  With no improvement in symptoms patient presented for evaluation.  On presentation patient also noted to have generalized jerking.  Initial NIHSS of 1.    Date last known well: Date: 05/25/2016 Time last known well: Time: 11:00 tPA Given: No: Improving symptoms  Past Medical History:  Diagnosis Date  . Hypertension   . Stroke [facility](HCC) 2013   "minor" - no deficits    Past Surgical History:  Procedure Laterality Date  . ESOPHAGEAL DILATION  02/24/2016   Procedure: ESOPHAGEAL DILATION;  Surgeon: Midge Miniumarren Wohl, MD;  [facility];  Service: Endoscopy;;  . ESOPHAGOGASTRODUODENOSCOPY (EGD) WITH PROPOFOL N/A 02/24/2016   Procedure: ESOPHAGOGASTRODUODENOSCOPY (EGD) WITH PROPOFOL;  Surgeon: Midge Miniumarren Wohl, MD;  [facility];  Service: Endoscopy;  Laterality: N/A;  . ESOPHAGOGASTRODUODENOSCOPY ENDOSCOPY  03/2013   Dr. Servando Snarewohl  . THORACOTOMY  1953    Family history: Father deceased with CAD, aortic aneurysm and stroke.  Mother and sister deceased.     Social History:  reports that he has never smoked. He has never used smokeless tobacco. He reports that he does not drink alcohol. His drug history is not on file.  Allergies:  Allergies  Allergen Reactions  . Finasteride Nausea Only    confusion  . Lisinopril Other (See Comments)    Confusion    Medications: I have reviewed the patient's current medications. Prior to Admission:  Prior to Admission medications   Medication Sig Start Date End Date Taking? Authorizing Provider  acetaminophen (TYLENOL) 325 MG tablet Take 650 mg by mouth every 6 (six) hours as needed.   Yes Historical Provider, MD  aspirin EC 81 MG tablet Take by mouth.   Yes Historical Provider, MD   atorvastatin (LIPITOR) 10 MG tablet Take by mouth. 06/10/15 06/09/16 Yes Historical Provider, MD  loratadine (CLARITIN) 5 MG chewable tablet Chew by mouth.   Yes Historical Provider, MD  metoprolol tartrate (LOPRESSOR) 25 MG tablet Take by mouth. 06/18/15 06/09/16 Yes Historical Provider, MD     ROS: History obtained from the patient  General ROS: negative for - chills, fatigue, fever, night sweats, weight gain or weight loss Psychological ROS: negative for - behavioral disorder, hallucinations, memory difficulties, mood swings or suicidal ideation Ophthalmic ROS: negative for - blurry vision, double vision, eye pain or loss of vision ENT ROS: HOH Allergy and Immunology ROS: negative for - hives or itchy/watery eyes Hematological and Lymphatic ROS: negative for - bleeding problems, bruising or swollen lymph nodes Endocrine ROS: negative for - galactorrhea, hair pattern changes, polydipsia/polyuria or temperature intolerance Respiratory ROS: negative for - cough, hemoptysis, shortness of breath or wheezing Cardiovascular ROS: negative for - chest pain, dyspnea on exertion, edema or irregular heartbeat Gastrointestinal ROS: negative for - abdominal pain, diarrhea, hematemesis, nausea/vomiting or stool incontinence Genito-Urinary ROS: negative for - dysuria, hematuria, incontinence or urinary frequency/urgency Musculoskeletal ROS: negative for - joint swelling or muscular weakness Neurological ROS: as noted in HPI Dermatological ROS: negative for rash and skin lesion changes  Physical Examination: Blood pressure (!) 190/100, pulse 76, temperature 97.7 F (36.5 C), temperature source Oral, resp. rate 18, height 5\' 11"  (1.803 m), weight 83 kg (183 lb), SpO2 97 %.  HEENT-  Normocephalic, no lesions, without obvious abnormality.  Normal external eye and conjunctiva.  Normal TM's bilaterally.  Normal auditory canals and external ears. Normal external nose, mucus membranes and septum.  Normal  pharynx. Cardiovascular- S1, S2 normal, pulses palpable throughout   Lungs- chest clear, no wheezing, rales, normal symmetric air entry Abdomen- soft, non-tender; bowel sounds normal; no masses,  no organomegaly Extremities- no edema Lymph-no adenopathy palpable Musculoskeletal-no joint tenderness, deformity or swelling Skin-warm and dry, no hyperpigmentation, vitiligo, or suspicious lesions  Neurological Examination Mental Status: Alert, oriented to name and place but not to month, thought content appropriate.  Speech fluent without evidence of aphasia.  Able to follow 3 step commands with some reinforcement required.  Unclear if related to hearing.   Cranial Nerves: II: Discs flat bilaterally; Visual fields grossly normal, pupils equal, round, reactive to light and accommodation III,IV, VI: ptosis not present, extra-ocular motions intact bilaterally V,VII: smile symmetric, facial light touch sensation normal bilaterally VIII: hearing decreased bilaterally IX,X: gag reflex present XI: bilateral shoulder shrug XII: midline tongue extension Motor: Right : Upper extremity   5/5    Left:     Upper extremity   5/5  Lower extremity   5/5     Lower extremity   5/5 Tone and bulk:normal tone throughout; no atrophy noted.  Generalized jerking noted-? myoclonus Sensory: Pinprick and light touch intact throughout, bilaterally Deep Tendon Reflexes: 2+ in the upper extremities, 1+ KJ's bilaterally, absent AJ's bilaterally Plantars: Right: downgoing   Left: downgoing Cerebellar: Normal finger-to-nose and normal heel-to-shin testing bilaterally Gait: not tested due to safety concerns    Laboratory Studies:  Basic Metabolic Panel: No results for input(s): NA, K, CL, CO2, GLUCOSE, BUN, CREATININE, CALCIUM, MG, PHOS in the last 168 hours.  Liver Function Tests: No results for input(s): AST, ALT, ALKPHOS, BILITOT, PROT, ALBUMIN in the last 168 hours. No results for input(s): LIPASE, AMYLASE in  the last 168 hours. No results for input(s): AMMONIA in the last 168 hours.  CBC: No results for input(s): WBC, NEUTROABS, HGB, HCT, MCV, PLT in the last 168 hours.  Cardiac Enzymes: No results for input(s): CKTOTAL, CKMB, CKMBINDEX, TROPONINI in the last 168 hours.  BNP: Invalid input(s): POCBNP  CBG:  Recent Labs Lab 05/25/16 1240  GLUCAP 100*    Microbiology: No results found for this or any previous visit.  Coagulation Studies: No results for input(s): LABPROT, INR in the last 72 hours.  Urinalysis: No results for input(s): COLORURINE, LABSPEC, PHURINE, GLUCOSEU, HGBUR, BILIRUBINUR, KETONESUR, PROTEINUR, UROBILINOGEN, NITRITE, LEUKOCYTESUR in the last 168 hours.  Invalid input(s): APPERANCEUR  Lipid Panel: No results found for: CHOL, TRIG, HDL, CHOLHDL, VLDL, LDLCALC  HgbA1C: No results found for: HGBA1C  Urine Drug Screen:  No results found for: LABOPIA, COCAINSCRNUR, LABBENZ, AMPHETMU, THCU, LABBARB  Alcohol Level: No results for input(s): ETH in the last 168 hours.   Imaging: Ct Head Code Stroke W/o Cm  Result Date: 05/25/2016 CLINICAL DATA:  Code stroke. Stroke symptoms. Last seen normal 11 a.m. EXAM: CT HEAD WITHOUT CONTRAST TECHNIQUE: Contiguous axial images were obtained from the base of the skull through the vertex without intravenous contrast. COMPARISON:  CT head 07/12/2011 FINDINGS: Brain: Negative for acute infarct. Negative for acute hemorrhage or mass Generalized atrophy. Hypodensity in the white matter bilaterally similar to the prior study and consistent with chronic microvascular ischemia. Vascular: No hyperdense vessel or unexpected calcification. Skull: Negative Sinuses/Orbits: Mucosal edema paranasal sinuses.  No orbital lesion. Other: None ASPECTS (Alberta Stroke Program Early CT Score) - Ganglionic level  infarction (caudate, lentiform nuclei, internal capsule, insula, M1-M3 cortex): 7 - Supraganglionic infarction (M4-M6 cortex): 3 Total score (0-10  with 10 being normal): 10 IMPRESSION: 1. No acute intracranial abnormality. Atrophy with chronic microvascular ischemia stable from the prior study. 2. ASPECTS is 10 These results were called by telephone at the time of interpretation on 05/25/2016 at 1:07 pm to Dr. Jene Every , who verbally acknowledged these results. Electronically Signed   By: Marlan Palau M.D.   On: 05/25/2016 13:08    Assessment: 82 y.o. male presenting with complaints of slurred speech and upper extremity weakness.  Symptoms improving but now with some jerking noted on examination.  Head CT reviewed and shows no acute changes.  Patient on ASA at home.  Acute infarct remains in the differential but concerned about metabolic etiology as well.  Further work up recommended.  Stroke Risk Factors - hypertension  Plan: 1. MRI of the brain without contrast.  If indicative of an acute infarct, stroke work up recommended.  Would also add Plavix to ASA.   2. Continue ASA 3. NPO until RN stroke swallow screen 4. Telemetry monitoring 5. Frequent neuro checks 6. Agree with ruling out metabolic, medical issues for presentation.    Case discussed with Dr. Geronimo Boot, MD Neurology [phone] 05/25/2016, 1:21 PM

## 2016-05-25 NOTE — ED Notes (Signed)
Patient transported to MRI 

## 2016-05-25 NOTE — ED Provider Notes (Signed)
Patient received in sign-out from Dr. Cyril Loosen.  Workup and evaluation pending MRI.  MRI does show evidence fo acute stroke.  Will admit to hospitalist.      Willy Eddy, MD 05/25/16 [phone]

## 2016-05-25 NOTE — ED Notes (Signed)
Patient returned from MRI. Hooked up to cardiac monitor. NAD noted.

## 2016-05-25 NOTE — Progress Notes (Signed)
Pt was returning from CT when [facility] arrived. Husband of Pt in room wiating. CH waited for a few minutes. CH is available.   05/25/16 1235  Clinical Encounter Type  Visited With Patient and family together;Health care provider  Visit Type ED  Referral From Nurse  Spiritual Encounters  Spiritual Needs Emotional  Stress Factors  Patient Stress Factors Health changes  Family Stress Factors None identified

## 2016-05-25 NOTE — ED Notes (Signed)
Last normal was 11 AM, states he began feeling generalized weakness with slurred speech, alert upon arrival, hx of CVA, on ASA

## 2016-05-25 NOTE — ED Notes (Signed)
Transporting patient to room 118-1C 

## 2016-05-25 NOTE — ED Provider Notes (Signed)
[facility] Emergency Department Provider Note   ____________________________________________    I have reviewed the triage vital signs and the nursing notes.   HISTORY  Chief Complaint Code Stroke     HPI Matthew Greer is a 82 y.o. male who presents today with complaints of generalized weakness, shaking and slurred speech which started around 11 AM. The stroke was called from triage patient was seen by Dr. Thad Ranger who feels this is unlikely to be CVA. Patient denies chest pain or shortness of breath. No fevers or chills. No cough. No dysuria. Has never had this before.   Past Medical History:  Diagnosis Date  . Hypertension   . Stroke [facility]) 2013   "minor" - no deficits    Patient Active Problem List   Diagnosis Date Noted  . Problems with swallowing and mastication   . Stricture and stenosis of esophagus   . Benign essential hypertension 01/27/2016  . Kidney stones 01/27/2016  . Other nonspecific abnormal finding of lung field 01/27/2016  . Pure hypercholesterolemia 01/27/2016  . Stroke (HCC) 01/27/2016  . 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 Rotator cuff tendonitis 11/10/2013  . Hearing loss 04/09/2013  . Eczema 07/26/2012  . Abdominal pain 12/27/2011    Past Surgical History:  Procedure Laterality Date  . ESOPHAGEAL DILATION  02/24/2016   Procedure: ESOPHAGEAL DILATION;  Surgeon: Midge Minium, MD;  Location: [facility];  Service: Endoscopy;;  . ESOPHAGOGASTRODUODENOSCOPY (EGD) WITH PROPOFOL N/A 02/24/2016   Procedure: ESOPHAGOGASTRODUODENOSCOPY (EGD) WITH PROPOFOL;  Surgeon: Midge Minium, MD;  Location[facility];  Service: Endoscopy;  Laterality: N/A;  . ESOPHAGOGASTRODUODENOSCOPY ENDOSCOPY  03/2013   Dr. Servando Snare  . THORACOTOMY  1953    Prior to Admission medications   Medication Sig Start Date End Date Taking? Authorizing Provider  acetaminophen (TYLENOL) 325 MG tablet Take 650 mg by mouth  every 6 (six) hours as needed.   Yes Historical Provider, MD  aspirin EC 81 MG tablet Take by mouth.   Yes Historical Provider, MD  atorvastatin (LIPITOR) 10 MG tablet Take by mouth. 06/10/15 06/09/16 Yes Historical Provider, MD  loratadine (CLARITIN) 5 MG chewable tablet Chew by mouth.   Yes Historical Provider, MD  metoprolol tartrate (LOPRESSOR) 25 MG tablet Take by mouth. 06/18/15 06/09/16 Yes Historical Provider, MD     Allergies Finasteride and Lisinopril  No family history on file.  Social History Social History  Substance Use Topics  . Smoking status: Never Smoker  . Smokeless tobacco: Never Used  . Alcohol use No    Review of Systems  Constitutional: No fever/chills Eyes: No visual changes.  ENT: NoNeck pain Cardiovascular: Denies chest pain. Respiratory: Denies shortness of breath. Gastrointestinal: No abdominal pain.  No nausea, no vomiting.   Genitourinary: Negative for dysuria. Musculoskeletal: Negative for back pain. Skin: Negative for rash. Neurological: Negative for headaches  10-point ROS otherwise negative.  ____________________________________________   PHYSICAL EXAM:  VITAL SIGNS: ED Triage Vitals  Enc Vitals Group     BP 05/25/16 1240 (!) 190/100     Pulse Rate 05/25/16 1240 76     Resp 05/25/16 1240 18     Temp 05/25/16 1240 97.7 F (36.5 C)     Temp Source 05/25/16 1240 Oral     SpO2 05/25/16 1240 97 %     Weight 05/25/16 1305 183 lb (83 kg)     Height 05/25/16 1305 5\' 11"  (1.803 m)     Head Circumference --  Peak Flow --      Pain Score --      Pain Loc --      Pain Edu? --      Excl. in GC? --     Constitutional: Alert and oriented. No acute distress. Pleasant and interactive Eyes: Conjunctivae are normal.  Head: Atraumatic. Nose: No congestion/rhinnorhea. Mouth/Throat: Mucous membranes are moist.    Cardiovascular: Normal rate, regular rhythm. Grossly normal heart sounds.  Good peripheral circulation. Respiratory: Normal  respiratory effort.  No retractions. Lungs CTAB. Gastrointestinal: Soft and nontender. No distention.  No CVA tenderness. Genitourinary: deferred Musculoskeletal: No lower extremity tenderness nor edema.  Warm and well perfused Neurologic:  Normal speech and language. No gross focal neurologic deficits are appreciated. Speech does not appear to slurred to me. Cranial nerves are intact Skin:  Skin is warm, dry and intact. No rash noted. Psychiatric: Mood and affect are normal. Speech and behavior are normal.  ____________________________________________   LABS (all labs ordered are listed, but only abnormal results are displayed)  Labs Reviewed  GLUCOSE, CAPILLARY - Abnormal; Notable for the following:       Result Value   Glucose-Capillary 100 (*)    All other components within normal limits  COMPREHENSIVE METABOLIC PANEL - Abnormal; Notable for the following:    Glucose, Bld 100 (*)    BUN 21 (*)    Creatinine, Ser 1.28 (*)    GFR calc non Af Amer 48 (*)    GFR calc Af Amer 56 (*)    All other components within normal limits  ETHANOL  PROTIME-INR  APTT  CBC  DIFFERENTIAL  TROPONIN I  RAPID URINE DRUG SCREEN, HOSP PERFORMED  URINALYSIS, ROUTINE W REFLEX MICROSCOPIC  CBG MONITORING, ED   ____________________________________________  EKG  ED ECG REPORT I, Jene EveryKINNER, Donato, the attending physician, personally viewed and interpreted this ECG.  Date: 05/25/2016 EKG Time: 12:58 PM Rate: 66 Rhythm: A. fib QRS Axis: normal Intervals: normal ST/T Wave abnormalities: normal Conduction Disturbances: none   ____________________________________________  RADIOLOGY  CT unremarkable Mr pending ____________________________________________   PROCEDURES  Procedure(s) performed: No    Critical Care performed:No ____________________________________________   INITIAL IMPRESSION / ASSESSMENT AND PLAN / ED COURSE  Pertinent labs & imaging results that were available  during my care of the patient were reviewed by me and considered in my medical decision making (see chart for details).  ----------------------------------------- 3:37 PM on 05/25/2016 -----------------------------------------  Patient well-appearing, he no longer has any tremors. Discussed with Dr. Thad Rangereynolds who recommends MRI of the brain and if normal okay for discharge  Clinical Course    ____________________________________________   FINAL CLINICAL IMPRESSION(S) / ED DIAGNOSES  Final diagnoses:  Slurred speech      NEW MEDICATIONS STARTED DURING THIS VISIT:  New Prescriptions   No medications on file     Note:  This document was prepared using Dragon voice recognition software and may include unintentional dictation errors.    Jene Everyobert Berkley Cronkright, MD 05/25/16 [id number]

## 2016-05-25 NOTE — ED Notes (Signed)
Pt. Transported to room 118 by Lelon Mast, EDT.

## 2016-05-25 NOTE — ED Notes (Signed)
CBG 100 

## 2016-05-26 ENCOUNTER — Inpatient Hospital Stay: Payer: Medicare Other

## 2016-05-26 ENCOUNTER — Inpatient Hospital Stay
Admit: 2016-05-26 | Discharge: 2016-05-26 | Disposition: A | Discharge status: Home / Self Care | Payer: Medicare Other | Attending: Internal Medicine | Admitting: Internal Medicine

## 2016-05-26 LAB — LIPID PANEL
CHOL/HDL RATIO: 3.9 ratio
CHOLESTEROL: 175 mg/dL (ref 0–200)
HDL: 45 mg/dL (ref >40)
LDL CALC: 113 mg/dL — AB (ref 0–99)
TRIGLYCERIDES: 87 mg/dL (ref <150)
VLDL: 17 mg/dL (ref 0–40)

## 2016-05-26 LAB — URINALYSIS, ROUTINE W REFLEX MICROSCOPIC
Bacteria, UA: NONE SEEN
Bilirubin Urine: NEGATIVE
GLUCOSE, UA: NEGATIVE mg/dL
Ketones, ur: NEGATIVE mg/dL
NITRITE: NEGATIVE
PH: 7 (ref 5.0–8.0)
PROTEIN: NEGATIVE mg/dL
Specific Gravity, Urine: 1.011 (ref 1.005–1.030)
Squamous Epithelial / LPF: NONE SEEN

## 2016-05-26 LAB — URINE DRUG SCREEN, QUALITATIVE (ARMC ONLY)
AMPHETAMINES, UR SCREEN: NOT DETECTED
Barbiturates, Ur Screen: NOT DETECTED
Benzodiazepine, Ur Scrn: NOT DETECTED
CANNABINOID 50 NG, UR ~~LOC~~: NOT DETECTED
COCAINE METABOLITE, UR ~~LOC~~: NOT DETECTED
MDMA (ECSTASY) UR SCREEN: NOT DETECTED
Methadone Scn, Ur: NOT DETECTED
Opiate, Ur Screen: NOT DETECTED
PHENCYCLIDINE (PCP) UR S: NOT DETECTED
Tricyclic, Ur Screen: NOT DETECTED

## 2016-05-26 LAB — ECHOCARDIOGRAM COMPLETE
Height: 71 in
Weight: 2928 oz

## 2016-05-26 MED ORDER — AMLODIPINE BESYLATE 5 MG PO TABS
5.0000 mg | ORAL_TABLET | Freq: Every day | ORAL | Status: DC
  Administered 2016-05-26: 5 mg via ORAL
  Filled 2016-05-26: qty 1

## 2016-05-26 MED ORDER — LABETALOL HCL 5 MG/ML IV SOLN
10.0000 mg | INTRAVENOUS | Status: DC | PRN
  Administered 2016-05-26 – 2016-05-27 (×3): 10 mg via INTRAVENOUS
  Filled 2016-05-26 (×5): qty 4

## 2016-05-26 NOTE — Progress Notes (Signed)
*  PRELIMINARY RESULTS* Echocardiogram 2D Echocardiogram has been performed.  Cristela Blue 05/26/2016, 10:27 AM

## 2016-05-26 NOTE — NC FL2 (Signed)
[place] MEDICAID FL2 LEVEL OF CARE SCREENING TOOL     IDENTIFICATION  Patient Name: Matthew Greer Birthdate: [birth year] Sex: male Admission Date (Current Location): 05/25/2016  [place] and [place] Number:  Chiropodist and Address:  [facility], [address] [place], [place] [address]      Provider Number: [id number]  Attending Physician Name and Address:  Wyatt Haste, MD  Relative Name and Phone Number:       Current Level of Care: Hospital Recommended Level of Care: Skilled Nursing Facility Prior Approval Number:    Date Approved/Denied:   PASRR Number:  [id number])  Discharge Plan: SNF    Current Diagnoses: Patient Active Problem List   Diagnosis Date Noted  . CVA (cerebral vascular accident) (HCC) 05/25/2016  . Problems with swallowing and mastication   . Stricture and stenosis of esophagus   . Benign essential hypertension 01/27/2016  . Kidney stones 01/27/2016  . Other nonspecific abnormal finding of lung field 01/27/2016  . Pure hypercholesterolemia 01/27/2016  . Stroke (HCC) 01/27/2016  . 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 Rotator cuff tendonitis 11/10/2013  . Hearing loss 04/09/2013  . Eczema 07/26/2012  . Abdominal pain 12/27/2011    Orientation RESPIRATION BLADDER Height & Weight     Self, Time, Situation, Place  Normal Continent Weight: 183 lb (83 kg) Height:  5\' 11"  (180.3 cm)  BEHAVIORAL SYMPTOMS/MOOD NEUROLOGICAL BOWEL NUTRITION STATUS   (none)  (none) Continent Diet (Diet: Heart Healthy )  AMBULATORY STATUS COMMUNICATION OF NEEDS Skin   Extensive Assist Verbally Normal                       Personal Care Assistance Level of Assistance  Bathing, Feeding, Dressing Bathing Assistance: Limited assistance Feeding assistance: Independent Dressing Assistance: Limited assistance     Functional Limitations Info  Hearing, Sight, Speech Sight Info: Adequate Hearing Info:  Impaired Speech Info: Adequate    SPECIAL CARE FACTORS FREQUENCY  PT (By licensed PT), OT (By licensed OT)     PT Frequency:  (5) OT Frequency:  (5)            Contractures      Additional Factors Info  Code Status, Allergies Code Status Info:  (Full Code. ) Allergies Info:  (Finasteride, Lisinopril)           Current Medications (05/26/2016):  This is the current hospital active medication list Current Facility-Administered Medications  Medication Dose Route Frequency Provider Last Rate Last Dose  . acetaminophen (TYLENOL) tablet 650 mg  650 mg Oral Q4H PRN Auburn Bilberry, MD       Or  . acetaminophen (TYLENOL) solution 650 mg  650 mg Per Tube Q4H PRN Auburn Bilberry, MD       Or  . acetaminophen (TYLENOL) suppository 650 mg  650 mg Rectal Q4H PRN Auburn Bilberry, MD      . amLODipine (NORVASC) tablet 5 mg  5 mg Oral Daily Wyatt Haste, MD   5 mg at 05/26/16 1255  . aspirin EC tablet 81 mg  81 mg Oral Daily Auburn Bilberry, MD   81 mg at 05/26/16 9449  . atorvastatin (LIPITOR) tablet 80 mg  80 mg Oral q1800 Auburn Bilberry, MD      . clopidogrel (PLAVIX) tablet 75 mg  75 mg Oral Daily Auburn Bilberry, MD   75 mg at 05/26/16 0752  . enoxaparin (LOVENOX) injection 40 mg  40 mg  Subcutaneous Q24H Auburn BilberryShreyang Patel, MD   40 mg at 05/25/16 2316  . labetalol (NORMODYNE,TRANDATE) injection 10 mg  10 mg Intravenous Q2H PRN Ihor AustinPavan Pyreddy, MD   10 mg at 05/26/16 0542  . loratadine (CLARITIN) tablet 5 mg  5 mg Oral Daily Auburn BilberryShreyang Patel, MD   5 mg at 05/26/16 0751  . metoprolol tartrate (LOPRESSOR) tablet 25 mg  25 mg Oral BID Auburn BilberryShreyang Patel, MD   25 mg at 05/26/16 0752  . senna-docusate (Senokot-S) tablet 1 tablet  1 tablet Oral QHS PRN Auburn BilberryShreyang Patel, MD         Discharge Medications: Please see discharge summary for a list of discharge medications.  Relevant Imaging Results:  Relevant Lab Results:   Additional Information  (SSN: [id number])  Yina Riviere, Darleen CrockerBailey M, LCSW

## 2016-05-26 NOTE — Evaluation (Signed)
Speech Language Pathology Evaluation Patient Details Name: Matthew Greer MRN: [id number] DOB: [birth year] Today's Date: 05/26/2016 Time: 0488-8916 SLP Time Calculation (min) (ACUTE ONLY): 25 min  Problem List:  Patient Active Problem List   Diagnosis Date Noted  . CVA (cerebral vascular accident) (HCC) 05/25/2016  . Problems with swallowing and mastication   . Stricture and stenosis of esophagus   . Benign essential hypertension 01/27/2016  . Kidney stones 01/27/2016  . Other nonspecific abnormal finding of lung field 01/27/2016  . Pure hypercholesterolemia 01/27/2016  . Stroke (HCC) 01/27/2016  . 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 Rotator cuff tendonitis 11/10/2013  . Hearing loss 04/09/2013  . Eczema 07/26/2012  . Abdominal pain 12/27/2011   Past Medical History:  Past Medical History:  Diagnosis Date  . Hypertension   . Stroke [facility]) 2013   "minor" - no deficits   Past Surgical History:  Past Surgical History:  Procedure Laterality Date  . ESOPHAGEAL DILATION  02/24/2016   Procedure: ESOPHAGEAL DILATION;  Surgeon: Midge Minium, MD;  Location: [facility];  Service: Endoscopy;;  . ESOPHAGOGASTRODUODENOSCOPY (EGD) WITH PROPOFOL N/A 02/24/2016   Procedure: ESOPHAGOGASTRODUODENOSCOPY (EGD) WITH PROPOFOL;  Surgeon: Midge Minium, MD;  Location: [facility];  Service: Endoscopy;  Laterality: N/A;  . ESOPHAGOGASTRODUODENOSCOPY ENDOSCOPY  03/2013   Dr. Servando Snare  . THORACOTOMY  1953   HPI:  Matthew Greer is a 82 y.o. male with a known history of "minor strokes" was presented to the ED earlier with acute onset of weakness in his upper extremities and right sided worse. Also slurred speech. His symptoms were not severe. Therefore a neurology consult was obtained they recommended a MRI of the brain which confirmed a small CVA. Patient continues to complain of weakness in his upper extremities. Some slurred speech. He otherwise denies any difficulty with  swallowing no chest pain or shortness of breath.   Assessment / Plan / Recommendation Clinical Impression  Patient was in bed looking at his menu.  He demonstrates right facial droop.  He was minimally verbal with SLP this AM.  He did speak in full sentences several times "What do you want?" with low volume and imprecise articulation.  He was able to state name and date of birth on request.  Patient is hard of hearing but did not indicate to SLP that he could not hear me.  Patient then requested that SLP leave (told nurse "tell her to go away").  The patient is demonstrating dysarthria and SLP will continue to monitor for need for further intervention.    SLP Assessment  Patient needs continued Speech Lanaguage Pathology Services    Follow Up Recommendations  Other (comment) (To be determined)    Frequency and Duration min 2x/week  Other (comment) (While hospitalized)      SLP Evaluation Cognition  Overall Cognitive Status: Difficult to assess Arousal/Alertness: Lethargic Orientation Level: Oriented X4 Behaviors: Other (comment) (Not cooperative with SLP- unclear why)       Comprehension       Expression     Oral / Motor  Oral Motor/Sensory Function Overall Oral Motor/Sensory Function: Mild impairment Facial ROM: Reduced right Motor Speech Overall Motor Speech: Impaired Respiration: Impaired Level of Impairment: Sentence Phonation: Low vocal intensity Resonance: Within functional limits Articulation: Impaired Level of Impairment: Sentence Intelligibility: Intelligible   GO                   Dollene Primrose, MS/CCC- SLP  Odell Choung, Susie 05/26/2016,  10:24 AM

## 2016-05-26 NOTE — Progress Notes (Signed)
Rehab Admissions Coordinator Note:  Patient was screened by Clois Dupes for appropriateness for an Inpatient Acute Rehab Admission per OT recommendation.  At this time there is no bed available at [facility] campus inpt rehab before Tuesday, therefore other rehab venue options need to be pursued. I will alert RN CM.  Clois Dupes 05/26/2016, 1:41 PM  I can be reached at [phone].

## 2016-05-26 NOTE — Evaluation (Signed)
Occupational Therapy Evaluation Patient Details Name: Matthew Greer MRN: [id number] DOB: [birth year] Today's Date: 05/26/2016    History of Present Illness Pt. is an 82 y.o. male who was admitted to [facility] with a CVA, and right sided weakness. Imaging indicates progressive chronic microvascular changes in the white matter.   Clinical Impression   Pt. Is an 82 y.o. Male who was admitted to [facility] with a CVA, right sided weakness. Pt. Presents with poor sitting balance with strong leaning to the right, impaired RUE ROM, decreased proprioceptive awareness, weakness, and decreased functional mobility which hinder his ability to engage in, and complete ADLs. Pt. Could benefit from skilled OT services for ADL training, A/E training, UE there. Ex, neuromuscular re-ed, balance training, and functional mobility in order to work towards improving RUE functional use, and regaining independence with ADL and IADL tasks. Pt. Could benefit from CIR upon discharge, and follow-up OT services. Pt. was tearful at times during the session about his RUE.    Follow Up Recommendations  CIR CIR   Equipment Recommendations       Recommendations for Other Services       Precautions / Restrictions           Balance                                            ADL Overall ADL's : Needs assistance/impaired     Grooming: Moderate assistance (Uses left UE)           Upper Body Dressing : Maximal assistance   Lower Body Dressing: Maximal assistance               Functional mobility during ADLs:  (Supine to sit to EOB with min/mod assist.  Min A for LEs. mod assist for trunk.) General ADL Comments: Poor sitting balance with strong right sided leaning     Vision     Perception     Praxis      Pertinent Vitals/Pain Pain Assessment:  (Did not indicate pain)     Hand Dominance Right   Extremity/Trunk Assessment Upper Extremity Assessment Upper Extremity Assessment: RUE  deficits/detail (LUE: arthritic changes in left shoulder limiting ROM and strength.) RUE Deficits / Details: 2-/5 right shoulder flexion/abduction, elbow flexion 1/5, extension 2+/5, wrist extension to neutral. Pt. reports RUE is worse than this morning. Intact sensation to light touch, decreased proprioceptive awareness. RUE Sensation: decreased proprioception RUE Coordination: decreased fine motor;decreased gross motor           Communication Communication Communication: Expressive difficulties;HOH   Cognition Arousal/Alertness: Lethargic   Overall Cognitive Status: Difficult to assess                     General Comments       Exercises       Shoulder Instructions      Home Living Family/patient expects to be discharged to:: Private residence Living Arrangements: Non-relatives/Friends;Spouse/significant other Available Help at Discharge: Friend(s) Type of Home: House Home Access: Stairs to enter (1 step)     Home Layout: One level     Bathroom Shower/Tub: Walk-in shower;Door   [employer] Toilet: Standard Bathroom Accessibility: Yes   Home Equipment: Cane - single point          Prior Functioning/Environment Level of Independence: Independent        Comments: Pt.  has stopped driving because he noticed he was dosing off when driving.        OT Problem List: Decreased strength;Decreased range of motion;Pain;Decreased coordination;Impaired UE functional use;Decreased knowledge of use of DME or AE;Decreased activity tolerance   OT Treatment/Interventions: Self-care/ADL training;Therapeutic exercise;Neuromuscular education;DME and/or AE instruction;Patient/family education;Therapeutic activities    OT Goals(Current goals can be found in the care plan section) Acute Rehab OT Goals Patient Stated Goal: To regain independence, and return home. OT Goal Formulation: With patient Potential to Achieve Goals: Good  OT Frequency: Min 1X/week   Barriers to  D/C:            Co-evaluation              End of Session    Activity Tolerance: Patient tolerated treatment well Patient left: in bed;with call bell/phone within reach;with bed alarm set   Time: [id number] OT Time Calculation (min): 35 min Charges:  OT General Charges $OT Visit: 1 Procedure OT Evaluation $OT Eval High Complexity: 1 Procedure G-Codes:    Olegario MessierElaine Nadya Hopwood, MS, OTR/L 05/26/2016, 1:35 PM

## 2016-05-26 NOTE — Progress Notes (Signed)
PT Cancellation Note  Patient Details Name: Matthew Greer MRN: [id number] DOB: 1925/12/13   Cancelled Treatment:    Reason Eval/Treat Not Completed: Patient at procedure or test/unavailable (pt currently off floor for testing).  Will attempt to see pt again later today, schedule permitting.   Encarnacion Chu PT, DPT 05/26/2016, 9:08 AM

## 2016-05-26 NOTE — Clinical Social Work Placement (Signed)
   CLINICAL SOCIAL WORK PLACEMENT  NOTE  Date:  05/26/2016  Patient Details  Name: DEMONTA BUMGARDNER MRN: [id number] Date of Birth: March 31, 1926  Clinical Social Work is seeking post-discharge placement for this patient at the Skilled  Nursing Facility level of care (*CSW will initial, date and re-position this form in  chart as items are completed):  Yes   Patient/family provided with [facility] Clinical Social Work Department's list of facilities offering this level of care within the geographic area requested by the patient (or if unable, by the patient's family).  Yes   Patient/family informed of their freedom to choose among providers that offer the needed level of care, that participate in Medicare, Medicaid or managed care program needed by the patient, have an available bed and are willing to accept the patient.  Yes   Patient/family informed of [facility]'s ownership interest in [facility] and [facility], as well as of the fact that they are under no obligation to receive care at these facilities.  PASRR submitted to EDS on 05/26/16     PASRR number received on 05/26/16     Existing PASRR number confirmed on       FL2 transmitted to all facilities in geographic area requested by pt/family on 05/26/16     FL2 transmitted to all facilities within larger geographic area on       Patient informed that his/her managed care company has contracts with or will negotiate with certain facilities, including the following:            Patient/family informed of bed offers received.  Patient chooses bed at       Physician recommends and patient chooses bed at      Patient to be transferred to   on  .  Patient to be transferred to facility by       Patient family notified on   of transfer.  Name of family member notified:        PHYSICIAN       Additional Comment:    _______________________________________________ Trenae Brunke, Darleen Crocker, LCSW 05/26/2016, 4:23 PM

## 2016-05-26 NOTE — Clinical Social Work Note (Addendum)
Clinical Social Work Assessment  Patient Details  Name: Matthew Greer MRN: [id number] Date of Birth: [birth year]  Date of referral:  05/26/16               Reason for consult:  Facility Placement                Permission sought to share information with:  Chartered certified accountant granted to share information::  Yes, Verbal Permission Granted  Name::      [employer]::   [place]   Relationship::     Contact Information:     Housing/Transportation Living arrangements for the past 2 months:  [place] of Information:  Patient Patient Interpreter Needed:  None Criminal Activity/Legal Involvement Pertinent to Current Situation/Hospitalization:  No - Comment as needed Significant Relationships:  Spouse Lives with:  Spouse Do you feel safe going back to the place where you live?  Yes Need for family participation in patient care:  Yes (Comment)  Care giving concerns:  Patient lives in [place] with his spouse Waldo Laine.    Social Worker assessment / plan:  Holiday representative ([place]) received verbal consult from RN case manager that PT is recommending CIR however they have no beds at this time. CSW met with patient alone at bedside to discuss D/C plan. Patient was alert and oriented but was very hard of hearing. CSW introduced self and explained role of CSW department. Patient reported that he lives in [place] with his spouse. CSW explained that PT is recommending rehab and discussed the SNF process. Patient is agreeable to SNF search in [place] and [employer]. CSW left patient's spouse a Advertising account executive. FL2 complete and faxed out. CSW will continue to follow and assist as needed.   Patient's spouse called CSW back and was made aware of above. He is agreeable to SNF search. Per spouse he will be at [facility] tomorrow morning to visit patient.   Employment status:  Retired Forensic scientist:  Medicare PT  Recommendations:  Inpatient [place] / Referral to community resources:  [place]  Patient/Family's Response to care:  Patient is agreeable to [facility].   Patient/Family's Understanding of and Emotional Response to Diagnosis, Current Treatment, and Prognosis:  Patient was very pleasant and thanked CSW for visit.   Emotional Assessment Appearance:  Appears stated age Attitude/Demeanor/Rapport:    Affect (typically observed):  Accepting, Adaptable, Pleasant Orientation:  Oriented to Self, Oriented to Place, Oriented to  Time, Oriented to Situation Alcohol / Substance use:  Not Applicable Psych involvement (Current and /or in the community):  No (Comment)  Discharge Needs  Concerns to be addressed:  Discharge Planning Concerns Readmission within the last 30 days:  No Current discharge risk:  Dependent with Mobility Barriers to Discharge:  Continued Medical Work up   [employer], Veronia Beets, LCSW 05/26/2016, 4:24 PM

## 2016-05-26 NOTE — Care Management Important Message (Signed)
Important Message  Patient Details  Name: Matthew Greer MRN: [id number] Date of Birth: [birth year]   Medicare Important Message Given:  Yes    Gwenette Greet, RN 05/26/2016, 8:23 AM

## 2016-05-26 NOTE — Progress Notes (Signed)
[facility] [place] [facility]   PATIENT NAME: Matthew Greer    MRN#:  [id number]  DATE OF BIRTH:  [birth year]  SUBJECTIVE:  Hospital Day: 1 day Matthew Greer is a 82 y.o. male presenting with Code Stroke .   Overnight events: No acute overnight events Interval Events: Patient with expressive aphasia has weakness in right arm, states unable to stand  REVIEW OF SYSTEMS:  CONSTITUTIONAL: No fever, fatigue or weakness.  EYES: No blurred or double vision.  EARS, NOSE, AND THROAT: No tinnitus or ear pain.  RESPIRATORY: No cough, shortness of breath, wheezing or hemoptysis.  CARDIOVASCULAR: No chest pain, orthopnea, edema.  GASTROINTESTINAL: No nausea, vomiting, diarrhea or abdominal pain.  GENITOURINARY: No dysuria, hematuria.  ENDOCRINE: No polyuria, nocturia,  HEMATOLOGY: No anemia, easy bruising or bleeding SKIN: No rash or lesion. MUSCULOSKELETAL: No joint pain or arthritis.   NEUROLOGIC: No tingling, numbness, Positive weakness.  PSYCHIATRY: No anxiety or depression.   DRUG ALLERGIES:   Allergies  Allergen Reactions  . Finasteride Nausea Only    confusion  . Lisinopril Other (See Comments)    Confusion    VITALS:  Blood pressure (!) 178/90, pulse 73, temperature 98.1 F (36.7 C), temperature source Oral, resp. rate 18, height 5\' 11"  (1.803 m), weight 83 kg (183 lb), SpO2 97 %.  PHYSICAL EXAMINATION:  VITAL SIGNS: Vitals:   05/26/16 0859 05/26/16 1251  BP: (!) 168/88 (!) 178/90  Pulse: 84 73  Resp:    Temp: 97.6 F (36.4 C) 98.1 F (36.7 C)   GENERAL:82 y.o.male currently in no acute distress.  HEAD: Normocephalic, atraumatic.  EYES: Pupils equal, round, reactive to light. Extraocular muscles intact. No scleral icterus.  MOUTH: Moist mucosal membrane. Dentition intact. No abscess noted.  EAR, NOSE, THROAT: Clear without exudates. No external lesions.  NECK: Supple. No thyromegaly. No nodules. No JVD.  PULMONARY: Clear to  ascultation, without wheeze rails or rhonci. No use of accessory muscles, Good respiratory effort. good air entry bilaterally CHEST: Nontender to palpation.  CARDIOVASCULAR: S1 and S2. Regular rate and rhythm. No murmurs, rubs, or gallops. No edema. Pedal pulses 2+ bilaterally.  GASTROINTESTINAL: Soft, nontender, nondistended. No masses. Positive bowel sounds. No hepatosplenomegaly.  MUSCULOSKELETAL: No swelling, clubbing, or edema. Range of motion full in all extremities.  NEUROLOGIC: Right-sided facial droop with expressive aphasia. Strength 1/5 right upper extremity 4/5 right lower extremity 5/5 left upper and lower extremities Sensation intact. Reflexes intact.  SKIN: No ulceration, lesions, rashes, or cyanosis. Skin warm and dry. Turgor intact.  PSYCHIATRIC: Mood, affect within normal limits. The patient is awake, alert and seems to be oriented x 2. Insight, judgment intact.      LABORATORY PANEL:   CBC  Recent Labs Lab 05/25/16 1258  WBC 9.8  HGB 14.3  HCT 42.9  PLT 272   ------------------------------------------------------------------------------------------------------------------  Chemistries   Recent Labs Lab 05/25/16 1258  NA 139  K 4.1  CL 106  CO2 28  GLUCOSE 100*  BUN 21*  CREATININE 1.28*  CALCIUM 9.8  AST 22  ALT 17  ALKPHOS 76  BILITOT 0.4   ------------------------------------------------------------------------------------------------------------------  Cardiac Enzymes  Recent Labs Lab 05/25/16 1258  TROPONINI <0.03   ------------------------------------------------------------------------------------------------------------------  RADIOLOGY:  Mr Brain Wo Contrast  Result Date: 05/25/2016 CLINICAL DATA:  Slurred speech.  Right-sided weakness. EXAM: MRI HEAD WITHOUT CONTRAST TECHNIQUE: Multiplanar, multiecho pulse sequences of the brain and surrounding structures were obtained without intravenous contrast. COMPARISON:  CT 05/25/2016.  MRI  02/12/2011 FINDINGS: Brain: Small areas of restricted diffusion in the left internal capsule compatible with acute infarct. No other areas of acute infarct. Generalized atrophy without hydrocephalus. Progression of periventricular and deep white matter hyperintensities since 2012 most consistent with chronic microvascular ischemia. Negative for hemorrhage or mass. No shift of the midline structures. Vascular: Normal arterial flow voids. Skull and upper cervical spine: Negative Sinuses/Orbits: Mucosal edema paranasal sinuses with air-fluid level sphenoid sinus. Normal orbit. Other: None IMPRESSION: Small area of acute infarct left internal capsule Progressive chronic microvascular ischemic changes in the white matter since 2012. Electronically Signed   By: Marlan Palau M.D.   On: 05/25/2016 16:41   US Carotid Bilateral (at [facility] Only)  Result Date: 05/26/2016 CLINICAL DATA:  82 year old male with a history of cerebral vascular accident. Cardiovascular risk factors include hypertension, known prior stroke/TIA EXAM: BILATERAL CAROTID DUPLEX ULTRASOUND TECHNIQUE: Wallace Cullens scale imaging, color Doppler and duplex ultrasound were performed of bilateral carotid and vertebral arteries in the neck. COMPARISON:  02/12/2011 FINDINGS: Criteria: Quantification of carotid stenosis is based on velocity parameters that correlate the residual internal carotid diameter with NASCET-based stenosis levels, using the diameter of the distal internal carotid lumen as the denominator for stenosis measurement. The following velocity measurements were obtained: RIGHT ICA:  Systolic 61 cm/sec, Diastolic 18 cm/sec CCA:  84 cm/sec SYSTOLIC ICA/CCA RATIO:  0.7 ECA:  69 cm/sec LEFT ICA:  Systolic 54 cm/sec, Diastolic 10 cm/sec CCA:  89 cm/sec SYSTOLIC ICA/CCA RATIO:  0.6 ECA:  62 cm/sec Right Brachial SBP: Not acquired Left Brachial SBP: Not acquired RIGHT CAROTID ARTERY: No significant calcified disease of the right common carotid artery.  Intermediate waveform maintained. Mild plaque without significant calcifications at the right carotid bifurcation. Low resistance waveform of the right ICA. No significant tortuosity. RIGHT VERTEBRAL ARTERY: Antegrade flow with low resistance waveform. LEFT CAROTID ARTERY: No significant calcified disease of the left common carotid artery. Intermediate waveform maintained. Mild plaque at the left carotid bifurcation without significant calcifications. Low resistance waveform of the left ICA. LEFT VERTEBRAL ARTERY:  Antegrade flow with low resistance waveform. IMPRESSION: Color duplex indicates minimal homogeneous plaque, with no hemodynamically significant stenosis by duplex criteria in the extracranial cerebrovascular circulation. Signed, Yvone Neu. Loreta Ave, DO [facility] Electronically Signed   By: Gilmer Mor D.O.   On: 05/26/2016 11:24   Dg Chest Portable 1 View  Result Date: 05/25/2016 CLINICAL DATA:  Acute onset of upper extremity weakness, worse on the right. Slurred speech. EXAM: PORTABLE CHEST 1 VIEW COMPARISON:  CT 07/26/2012 and 01/01/2012.  Radiographs 07/12/2011. FINDINGS: 1438 hour. Lower lung volumes and mild patient rotation to the right. Allowing for this, cardiomegaly and aortic atherosclerosis are grossly stable. There is mild bibasilar atelectasis, but no confluent airspace opacity or significant pleural effusion. Biapical pleural thickening appears unchanged. No acute osseous findings are seen. IMPRESSION: No definite acute findings allowing for suboptimal inspiration and patient rotation. Electronically Signed   By: Carey Bullocks M.D.   On: 05/25/2016 14:57   Mr Maxine Glenn Head/brain WE Cm  Result Date: 05/26/2016 CLINICAL DATA:  Acute infarct of the left internal capsule. Slurred speech. Right-sided weakness. EXAM: MRA HEAD WITHOUT CONTRAST TECHNIQUE: Angiographic images of the Circle of Willis were obtained using MRA technique  without intravenous contrast. COMPARISON:  MRI brain from the same day. FINDINGS: Moderate tortuosity is present and may high cervical left internal carotid artery is. There is mild irregularity within the cavernous internal carotid arteries  bilaterally, right greater than left. No significant stenosis is present. The ICA terminus is normal bilaterally. There is artifactual signal loss in the distal M1 segments and proximal M2 segments bilaterally. More distal MCA and ACA branch vessels are within normal limits. The left internal carotid artery is the dominant vessel. The vertebrobasilar junction is not well visualized due to artifact. The right vertebral artery is hypoplastic and may terminate in the PICA. Mild narrowing of the proximal basilar artery is less than 50%. The more distal basilar artery is normal. There is artifactual signal loss the proximal P2 segments bilaterally. There is some attenuation of distal PCA branch vessels bilaterally. IMPRESSION: 1. Atherosclerotic changes within the cavernous internal carotid arteries bilaterally and proximal basilar artery without a significant stenosis relative to the more distal vessels. 2. Mild distal small vessel disease. 3. Tortuosity of the cervical left internal carotid artery is likely related to hypertension. 4. No acute or focal lesion to explain the patient's acute infarct. Electronically Signed   By: Marin Robertshristopher  Mattern M.D.   On: 05/26/2016 10:17   Ct Head Code Stroke W/o Cm  Result Date: 05/25/2016 CLINICAL DATA:  Code stroke. Stroke symptoms. Last seen normal 11 a.m. EXAM: CT HEAD WITHOUT CONTRAST TECHNIQUE: Contiguous axial images were obtained from the base of the skull through the vertex without intravenous contrast. COMPARISON:  CT head 07/12/2011 FINDINGS: Brain: Negative for acute infarct. Negative for acute hemorrhage or mass Generalized atrophy. Hypodensity in the white matter bilaterally similar to the prior study and consistent with  chronic microvascular ischemia. Vascular: No hyperdense vessel or unexpected calcification. Skull: Negative Sinuses/Orbits: Mucosal edema paranasal sinuses.  No orbital lesion. Other: None ASPECTS (Alberta Stroke Program Early CT Score) - Ganglionic level infarction (caudate, lentiform nuclei, internal capsule, insula, M1-M3 cortex): 7 - Supraganglionic infarction (M4-M6 cortex): 3 Total score (0-10 with 10 being normal): 10 IMPRESSION: 1. No acute intracranial abnormality. Atrophy with chronic microvascular ischemia stable from the prior study. 2. ASPECTS is 10 These results were called by telephone at the time of interpretation on 05/25/2016 at 1:07 pm to Dr. Jene EveryOBERT KINNER , who verbally acknowledged these results. Electronically Signed   By: Marlan Palauharles  Clark M.D.   On: 05/25/2016 13:08    EKG:   Orders placed or performed during the hospital encounter of 05/25/16  . ED EKG  . ED EKG    ASSESSMENT AND PLAN:   Matthew Greer is a 82 y.o. male presenting with Code Stroke . Admitted 05/25/2016 : Day #: 1 day  1. Acute stroke left internal capsule: Aspirin and Plavix, therapy -Echo performed Carotid Doppler performed LDL cholesterol 113  High intensity statin, blood pressure control 2. Essential hypertension poorly controlled we'll add Norvasc to beta blocker 3. Hyperlipidemia unspecified: High intensity statin therapy Disposition: PT evaluation  All the records are reviewed and case discussed with Care Management/Social Workerr. Management plans discussed with the patient, family and they are in agreement.  CODE STATUS: full TOTAL TIME TAKING CARE OF THIS PATIENT: 33 minutes.   POSSIBLE D/C IN 1-2DAYS, DEPENDING ON CLINICAL CONDITION.   Zamirah Denny,  Mardi MainlandDavid K M.D on 05/26/2016 at 1:41 PM  Between 7am to 6pm - Pager - [phone]  After 6pm: House Pager: - [phone]  [facility]  Office  [phone]  CC: Primary care physician; Rolm GalaGRANDIS, HEIDI, MD

## 2016-05-27 DIAGNOSIS — I63312 Cerebral infarction due to thrombosis of left middle cerebral artery: Secondary | ICD-10-CM

## 2016-05-27 LAB — HEMOGLOBIN A1C
HEMOGLOBIN A1C: 5.7 % — AB (ref 4.8–5.6)
MEAN PLASMA GLUCOSE: 117 mg/dL

## 2016-05-27 MED ORDER — ORAL CARE MOUTH RINSE
15.0000 mL | Freq: Two times a day (BID) | OROMUCOSAL | Status: DC
  Administered 2016-05-27 (×2): 15 mL via OROMUCOSAL

## 2016-05-27 MED ORDER — METOPROLOL TARTRATE 50 MG PO TABS
50.0000 mg | ORAL_TABLET | Freq: Two times a day (BID) | ORAL | Status: DC
  Administered 2016-05-27 – 2016-05-28 (×3): 50 mg via ORAL
  Filled 2016-05-27 (×3): qty 1

## 2016-05-27 MED ORDER — AMLODIPINE BESYLATE 10 MG PO TABS
10.0000 mg | ORAL_TABLET | Freq: Every day | ORAL | Status: DC
  Administered 2016-05-27 – 2016-05-28 (×2): 10 mg via ORAL
  Filled 2016-05-27 (×2): qty 1

## 2016-05-27 NOTE — Plan of Care (Signed)
Problem: SLP Dysphagia Goals Goal: Misc Dysphagia Goal Pt will safely tolerate po diet of least restrictive consistency w/ no overt s/s of aspiration noted by Staff/pt/family x3 sessions.    

## 2016-05-27 NOTE — Plan of Care (Signed)
Problem: Nutrition: Goal: Risk of aspiration will decrease Outcome: Progressing Pt on DSY III diet with thickened liquids

## 2016-05-27 NOTE — Consult Note (Signed)
Referring Physician: Cyril LoosenKinner     Chief Complaint: Upper extremity weakness  Per patient and family at bedside speech has improved but still has significant weakness on RUE and RLE  Past Medical History:  Diagnosis Date  . Hypertension   . Stroke [facility](HCC) 2013   "minor" - no deficits    Past Surgical History:  Procedure Laterality Date  . ESOPHAGEAL DILATION  02/24/2016   Procedure: ESOPHAGEAL DILATION;  Surgeon: Midge Miniumarren Wohl, MD;  [facility];  Service: Endoscopy;;  . ESOPHAGOGASTRODUODENOSCOPY (EGD) WITH PROPOFOL N/A 02/24/2016   Procedure: ESOPHAGOGASTRODUODENOSCOPY (EGD) WITH PROPOFOL;  Surgeon: Midge Miniumarren Wohl, MD;  [facility];  Service: Endoscopy;  Laterality: N/A;  . ESOPHAGOGASTRODUODENOSCOPY ENDOSCOPY  03/2013   Dr. Servando Snarewohl  . THORACOTOMY  1953    Family history: Father deceased with CAD, aortic aneurysm and stroke.  Mother and sister deceased.     Social History:  reports that he has quit smoking. He has never used smokeless tobacco. He reports that he does not drink alcohol. His drug history is not on file.  Allergies:  Allergies  Allergen Reactions  . Finasteride Nausea Only    confusion  . Lisinopril Other (See Comments)    Confusion    Medications: I have reviewed the patient's current medications. Prior to Admission:  Prior to Admission medications   Medication Sig Start Date End Date Taking? Authorizing Provider  acetaminophen (TYLENOL) 325 MG tablet Take 650 mg by mouth every 6 (six) hours as needed.   Yes Historical Provider, MD  aspirin EC 81 MG tablet Take by mouth.   Yes Historical Provider, MD  atorvastatin (LIPITOR) 10 MG tablet Take by mouth. 06/10/15 06/09/16 Yes Historical Provider, MD  loratadine (CLARITIN) 5 MG chewable tablet Chew by mouth.   Yes Historical Provider, MD  metoprolol tartrate (LOPRESSOR) 25 MG tablet Take by mouth. 06/18/15 06/09/16 Yes Historical Provider, MD     ROS: History obtained from the  patient  General ROS: negative for - chills, fatigue, fever, night sweats, weight gain or weight loss Psychological ROS: negative for - behavioral disorder, hallucinations, memory difficulties, mood swings or suicidal ideation Ophthalmic ROS: negative for - blurry vision, double vision, eye pain or loss of vision ENT ROS: HOH Allergy and Immunology ROS: negative for - hives or itchy/watery eyes Hematological and Lymphatic ROS: negative for - bleeding problems, bruising or swollen lymph nodes Endocrine ROS: negative for - galactorrhea, hair pattern changes, polydipsia/polyuria or temperature intolerance Respiratory ROS: negative for - cough, hemoptysis, shortness of breath or wheezing Cardiovascular ROS: negative for - chest pain, dyspnea on exertion, edema or irregular heartbeat Gastrointestinal ROS: negative for - abdominal pain, diarrhea, hematemesis, nausea/vomiting or stool incontinence Genito-Urinary ROS: negative for - dysuria, hematuria, incontinence or urinary frequency/urgency Musculoskeletal ROS: negative for - joint swelling or muscular weakness Neurological ROS: as noted in HPI Dermatological ROS: negative for rash and skin lesion changes  Physical Examination: Blood pressure (!) 157/95, pulse 80, temperature 97.8 F (36.6 C), temperature source Oral, resp. rate 20, height 5\' 11"  (1.803 m), weight 83 kg (183 lb), SpO2 98 %.  HEENT-  Normocephalic, no lesions, without obvious abnormality.  Normal external eye and conjunctiva.  Normal TM's bilaterally.  Normal auditory canals and external ears. Normal external nose, mucus membranes and septum.  Normal pharynx. Cardiovascular- S1, S2 normal, pulses palpable throughout   Lungs- chest clear, no wheezing, rales, normal symmetric air entry Abdomen- soft, non-tender; bowel sounds normal; no masses,  no organomegaly Extremities- no  edema Lymph-no adenopathy palpable Musculoskeletal-no joint tenderness, deformity or swelling Skin-warm  and dry, no hyperpigmentation, vitiligo, or suspicious lesions  Neurological Examination Mental Status: Alert, oriented to name and place but not to month, thought content appropriate.  Speech fluent without evidence of aphasia.  Able to follow 3 step commands with some reinforcement required.  Unclear if related to hearing.   Cranial Nerves: II: Discs flat bilaterally; Visual fields grossly normal, pupils equal, round, reactive to light and accommodation III,IV, VI: ptosis not present, extra-ocular motions intact bilaterally V,VII: smile symmetric, facial light touch sensation normal bilaterally VIII: hearing decreased bilaterally IX,X: gag reflex present XI: bilateral shoulder shrug XII: midline tongue extension Motor: Right : Upper extremity   5/5    Left:     Upper extremity   5/5  Lower extremity   5/5     Lower extremity   5/5 Tone and bulk:normal tone throughout; no atrophy noted.  Generalized jerking noted-? myoclonus Sensory: Pinprick and light touch intact throughout, bilaterally Deep Tendon Reflexes: 2+ in the upper extremities, 1+ KJ's bilaterally, absent AJ's bilaterally Plantars: Right: downgoing   Left: downgoing Cerebellar: Normal finger-to-nose and normal heel-to-shin testing bilaterally Gait: not tested due to safety concerns    Laboratory Studies:  Basic Metabolic Panel:  Recent Labs Lab 05/25/16 1258  NA 139  K 4.1  CL 106  CO2 28  GLUCOSE 100*  BUN 21*  CREATININE 1.28*  CALCIUM 9.8    Liver Function Tests:  Recent Labs Lab 05/25/16 1258  AST 22  ALT 17  ALKPHOS 76  BILITOT 0.4  PROT 6.7  ALBUMIN 4.1   No results for input(s): LIPASE, AMYLASE in the last 168 hours. No results for input(s): AMMONIA in the last 168 hours.  CBC:  Recent Labs Lab 05/25/16 1258  WBC 9.8  NEUTROABS 6.5  HGB 14.3  HCT 42.9  MCV 90.4  PLT 272    Cardiac Enzymes:  Recent Labs Lab 05/25/16 1258  TROPONINI <0.03    BNP: Invalid input(s):  POCBNP  CBG:  Recent Labs Lab 05/25/16 1240  GLUCAP 100*    Microbiology: No results found for this or any previous visit.  Coagulation Studies:  Recent Labs  05/25/16 1258  LABPROT 13.0  INR 0.98    Urinalysis:   Recent Labs Lab 05/25/16 2337  COLORURINE YELLOW*  LABSPEC 1.011  PHURINE 7.0  GLUCOSEU NEGATIVE  HGBUR SMALL*  BILIRUBINUR NEGATIVE  KETONESUR NEGATIVE  PROTEINUR NEGATIVE  NITRITE NEGATIVE  LEUKOCYTESUR LARGE*    Lipid Panel:    Component Value Date/Time   CHOL 175 05/26/2016 0450   TRIG 87 05/26/2016 0450   HDL 45 05/26/2016 0450   CHOLHDL 3.9 05/26/2016 0450   VLDL 17 05/26/2016 0450   LDLCALC 113 (H) 05/26/2016 0450    HgbA1C:  Lab Results  Component Value Date   HGBA1C 5.7 (H) 05/26/2016    Urine Drug Screen:      Component Value Date/Time   LABOPIA NONE DETECTED 05/25/2016 2337   COCAINSCRNUR NONE DETECTED 05/25/2016 2337   LABBENZ NONE DETECTED 05/25/2016 2337   AMPHETMU NONE DETECTED 05/25/2016 2337   THCU NONE DETECTED 05/25/2016 2337   LABBARB NONE DETECTED 05/25/2016 2337    Alcohol Level:   Recent Labs Lab 05/25/16 1258  ETH <5     Imaging: Mr Brain Wo Contrast  Result Date: 05/25/2016 CLINICAL DATA:  Slurred speech.  Right-sided weakness. EXAM: MRI HEAD WITHOUT CONTRAST TECHNIQUE: Multiplanar, multiecho pulse sequences of the brain and  surrounding structures were obtained without intravenous contrast. COMPARISON:  CT 05/25/2016.  MRI 02/12/2011 FINDINGS: Brain: Small areas of restricted diffusion in the left internal capsule compatible with acute infarct. No other areas of acute infarct. Generalized atrophy without hydrocephalus. Progression of periventricular and deep white matter hyperintensities since 2012 most consistent with chronic microvascular ischemia. Negative for hemorrhage or mass. No shift of the midline structures. Vascular: Normal arterial flow voids. Skull and upper cervical spine: Negative  Sinuses/Orbits: Mucosal edema paranasal sinuses with air-fluid level sphenoid sinus. Normal orbit. Other: None IMPRESSION: Small area of acute infarct left internal capsule Progressive chronic microvascular ischemic changes in the white matter since 2012. Electronically Signed   By: Marlan Palau M.D.   On: 05/25/2016 16:41   US Carotid Bilateral (at [facility] And [facility] Only)  Result Date: 05/26/2016 CLINICAL DATA:  82 year old male with a history of cerebral vascular accident. Cardiovascular risk factors include hypertension, known prior stroke/TIA EXAM: BILATERAL CAROTID DUPLEX ULTRASOUND TECHNIQUE: Wallace Cullens scale imaging, color Doppler and duplex ultrasound were performed of bilateral carotid and vertebral arteries in the neck. COMPARISON:  02/12/2011 FINDINGS: Criteria: Quantification of carotid stenosis is based on velocity parameters that correlate the residual internal carotid diameter with NASCET-based stenosis levels, using the diameter of the distal internal carotid lumen as the denominator for stenosis measurement. The following velocity measurements were obtained: RIGHT ICA:  Systolic 61 cm/sec, Diastolic 18 cm/sec CCA:  84 cm/sec SYSTOLIC ICA/CCA RATIO:  0.7 ECA:  69 cm/sec LEFT ICA:  Systolic 54 cm/sec, Diastolic 10 cm/sec CCA:  89 cm/sec SYSTOLIC ICA/CCA RATIO:  0.6 ECA:  62 cm/sec Right Brachial SBP: Not acquired Left Brachial SBP: Not acquired RIGHT CAROTID ARTERY: No significant calcified disease of the right common carotid artery. Intermediate waveform maintained. Mild plaque without significant calcifications at the right carotid bifurcation. Low resistance waveform of the right ICA. No significant tortuosity. RIGHT VERTEBRAL ARTERY: Antegrade flow with low resistance waveform. LEFT CAROTID ARTERY: No significant calcified disease of the left common carotid artery. Intermediate waveform maintained. Mild plaque at the left carotid bifurcation without significant calcifications. Low resistance waveform  of the left ICA. LEFT VERTEBRAL ARTERY:  Antegrade flow with low resistance waveform. IMPRESSION: Color duplex indicates minimal homogeneous plaque, with no hemodynamically significant stenosis by duplex criteria in the extracranial cerebrovascular circulation. Signed, Yvone Neu. Loreta Ave, DO [facility] Electronically Signed   By: Gilmer Mor D.O.   On: 05/26/2016 11:24   Dg Chest Portable 1 View  Result Date: 05/25/2016 CLINICAL DATA:  Acute onset of upper extremity weakness, worse on the right. Slurred speech. EXAM: PORTABLE CHEST 1 VIEW COMPARISON:  CT 07/26/2012 and 01/01/2012.  Radiographs 07/12/2011. FINDINGS: 1438 hour. Lower lung volumes and mild patient rotation to the right. Allowing for this, cardiomegaly and aortic atherosclerosis are grossly stable. There is mild bibasilar atelectasis, but no confluent airspace opacity or significant pleural effusion. Biapical pleural thickening appears unchanged. No acute osseous findings are seen. IMPRESSION: No definite acute findings allowing for suboptimal inspiration and patient rotation. Electronically Signed   By: Carey Bullocks M.D.   On: 05/25/2016 14:57   Mr Maxine Glenn Head/brain ZO Cm  Result Date: 05/26/2016 CLINICAL DATA:  Acute infarct of the left internal capsule. Slurred speech. Right-sided weakness. EXAM: MRA HEAD WITHOUT CONTRAST TECHNIQUE: Angiographic images of the Circle of Willis were obtained using MRA technique without intravenous contrast. COMPARISON:  MRI brain from the same day. FINDINGS: Moderate tortuosity is present and may high cervical left internal  carotid artery is. There is mild irregularity within the cavernous internal carotid arteries bilaterally, right greater than left. No significant stenosis is present. The ICA terminus is normal bilaterally. There is artifactual signal loss in the distal M1 segments and proximal M2 segments bilaterally. More distal MCA and ACA  branch vessels are within normal limits. The left internal carotid artery is the dominant vessel. The vertebrobasilar junction is not well visualized due to artifact. The right vertebral artery is hypoplastic and may terminate in the PICA. Mild narrowing of the proximal basilar artery is less than 50%. The more distal basilar artery is normal. There is artifactual signal loss the proximal P2 segments bilaterally. There is some attenuation of distal PCA branch vessels bilaterally. IMPRESSION: 1. Atherosclerotic changes within the cavernous internal carotid arteries bilaterally and proximal basilar artery without a significant stenosis relative to the more distal vessels. 2. Mild distal small vessel disease. 3. Tortuosity of the cervical left internal carotid artery is likely related to hypertension. 4. No acute or focal lesion to explain the patient's acute infarct. Electronically Signed   By: Marin Roberts M.D.   On: 05/26/2016 10:17   Ct Head Code Stroke W/o Cm  Result Date: 05/25/2016 CLINICAL DATA:  Code stroke. Stroke symptoms. Last seen normal 11 a.m. EXAM: CT HEAD WITHOUT CONTRAST TECHNIQUE: Contiguous axial images were obtained from the base of the skull through the vertex without intravenous contrast. COMPARISON:  CT head 07/12/2011 FINDINGS: Brain: Negative for acute infarct. Negative for acute hemorrhage or mass Generalized atrophy. Hypodensity in the white matter bilaterally similar to the prior study and consistent with chronic microvascular ischemia. Vascular: No hyperdense vessel or unexpected calcification. Skull: Negative Sinuses/Orbits: Mucosal edema paranasal sinuses.  No orbital lesion. Other: None ASPECTS (Alberta Stroke Program Early CT Score) - Ganglionic level infarction (caudate, lentiform nuclei, internal capsule, insula, M1-M3 cortex): 7 - Supraganglionic infarction (M4-M6 cortex): 3 Total score (0-10 with 10 being normal): 10 IMPRESSION: 1. No acute intracranial abnormality.  Atrophy with chronic microvascular ischemia stable from the prior study. 2. ASPECTS is 10 These results were called by telephone at the time of interpretation on 05/25/2016 at 1:07 pm to Dr. Jene Every , who verbally acknowledged these results. Electronically Signed   By: Marlan Palau M.D.   On: 05/25/2016 13:08    Assessment: 82 y.o. male presenting with complaints of slurred speech and upper extremity weakness.  Symptoms improving but now with some jerking noted on examination.  Head CT reviewed and shows no acute changes.  Patient on ASA at home.  Acute infarct remains in the differential but concerned about metabolic etiology as well.  Further work up recommended.  Stroke Risk Factors - hypertension  Pt has L internal capsule stroke Con't dual anti platelet therapy D/c planning tomorrow  05/27/2016, 12:44 PM

## 2016-05-27 NOTE — Progress Notes (Signed)
Occupational Therapy Treatment Patient Details Name: Matthew CoxRobert K Hollingshead MRN: [id number] DOB: 11/10/1925 Today's Date: 05/27/2016    History of present illness Pt. is an 82 y.o. male who [facility] with a CVA, and right sided weakness. Imaging indicates progressive chronic microvascular changes in the white matter.   OT comments  Patient seen for OT treatment session with emphasis on RUE ROM, attempts at facilitation of movement, positioning and instruction on self directed ROM.  Patient able to participate with basic grooming tasks with left hand only.  RUE with increased spasticity at the shoulder this date.  Muscle contractions noted for shoulder flexion and elbow extension however did not produce any visible movement.  Patient tends to lean to right side and requires cues and assist to correct and able to maintain upright posture for short periods of time up to a minute.  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 He can demonstrate passive elbow flexion, wrist flexion/extension and digit flexion/extension with use of left hand to assist with exercise. Will plan to follow up next date and work more on UB dressing and self care tasks.    Follow Up Recommendations       Equipment Recommendations       Recommendations for Other Services      Precautions / Restrictions Precautions Precautions: Fall Restrictions Weight Bearing Restrictions: No       Mobility Bed Mobility                  Transfers                      Balance                                   ADL Overall ADL's : Needs assistance/impaired     Grooming: Moderate assistance           Upper Body Dressing : Maximal assistance                     General ADL Comments: Poor sitting balance with strong right sided leaning      Vision                      Perception     Praxis      Cognition   Behavior During Therapy: WFL for tasks assessed/performed Overall Cognitive Status: Within Functional Limits for tasks assessed                       Extremity/Trunk Assessment               Exercises Other Exercises Other Exercises: Patient seen this date for PROM to RUE for shoulder flexion, ABD/ADD, elbow flexion, extension, supination, wrist extension and finger flexion /extension.  Instructed patient on self directed ROM and he is able to demonstrate elbow, wrist and hand but has difficulty with using left arm to assist with right due to arthritis and increased spasticity in the right shoulder. Attempts at facilitation of movement of the right UE, muscle contraction noted for shoulder flexion and elbow extension with facilitation but no active movement noted. Instructed patient on positioning of right arm with pillow in ABD and elevation.  Sitting  balance in chair with cues and assist, tends to lean to right side and has difficulty maintaining.      Shoulder Instructions       General Comments      Pertinent Vitals/ Pain       Pain Assessment: No/denies pain  Home Living                                          Prior Functioning/Environment              Frequency  Min 1X/week        Progress Toward Goals  OT Goals(current goals can now be found in the care plan section)  Progress towards OT goals: Progressing toward goals  Acute Rehab OT Goals Patient Stated Goal: To regain independence, and return home. OT Goal Formulation: With patient Potential to Achieve Goals: Good  Plan Discharge plan remains appropriate    Co-evaluation                 End of Session     Activity Tolerance Patient tolerated treatment well   Patient Left in chair;with call bell/phone within reach;with chair alarm set;with family/visitor present   Nurse Communication           Time: 4599-7741 OT Time Calculation (min): 35 min  Charges: OT General Charges $OT Visit: 1 Procedure OT Treatments $Therapeutic Exercise: 23-37 mins Rameen Quinney T Evie Croston, OTR/L, CLT  Maayan Jenning 05/27/2016, 12:33 PM

## 2016-05-27 NOTE — Evaluation (Signed)
Physical Therapy Evaluation Patient Details Name: Matthew Greer MRN: [id number] DOB: 12-27-1925 Today's Date: 05/27/2016   History of Present Illness  Pt. is an 82 y.o. male who was admitted to [facility] with a CVA, and right sided weakness. Imaging indicates progressive chronic microvascular changes in the white matter.  Clinical Impression  Pt is demonstrating willingness tto work and ability to use R side now.  He is interested in having inpt therapy and will have case manager discuss the options for therapy at the hospitals, but his significant other is driving from 25 miles away to see him, and will want to continue to be heavily involved.  Will follow acutely to maintain his mobility and work toward more standing and gait as able.    Follow Up Recommendations CIR    Equipment Recommendations  None recommended by PT    Recommendations for Other Services Rehab consult     Precautions / Restrictions Precautions Precautions: Fall Restrictions Weight Bearing Restrictions: No      Mobility  Bed Mobility Overal bed mobility: Needs Assistance Bed Mobility: Supine to Sit;Sit to Supine     Supine to sit:  (to R is mod-max assist, to L is min mod assist) Sit to supine: Max assist      Transfers Overall transfer level: Needs assistance Equipment used: 1 person hand held assist Transfers: Sit to/from [employer] Sit to Stand: Mod assist Stand pivot transfers: Mod assist;Max assist (shuffled sidesteps toward L side)          Ambulation/Gait             General Gait Details: unable to wb through RLE enough to really take steps  Stairs            Wheelchair Mobility    Modified Rankin (Stroke Patients Only) Modified Rankin (Stroke Patients Only) Pre-Morbid Rankin Score: No significant disability Modified Rankin: Moderately severe disability     Balance Overall balance assessment: Needs assistance Sitting-balance support: Bilateral upper  extremity supported;Feet supported Sitting balance-Leahy Scale: Poor     Standing balance support: Bilateral upper extremity supported Standing balance-Leahy Scale: Poor                               Pertinent Vitals/Pain Pain Assessment: No/denies pain    Home Living Family/patient expects to be discharged to:: Inpatient rehab Living Arrangements: Non-relatives/Friends;Spouse/significant other Available Help at Discharge: Friend(s) Type of Home: House Home Access: Stairs to enter   Secretary/administrator of Steps: 1 Home Layout: One level Home Equipment: Cane - single point (no AD to walk previously)      Prior Function Level of Independence: Independent         Comments: significant other reports he does still drive     Hand Dominance   Dominant Hand: Right    Extremity/Trunk Assessment   Upper Extremity Assessment Upper Extremity Assessment: RUE deficits/detail RUE Deficits / Details: 2-/5 right shoulder flexion/abduction, elbow flexion 1/5, extension 2+/5, wrist extension to neutral. Pt. reports RUE is worse than this morning. Intact sensation to light touch, decreased proprioceptive awareness.    Lower Extremity Assessment Lower Extremity Assessment: RLE deficits/detail RLE Deficits / Details: 2+ to 3- strength    Cervical / Trunk Assessment Cervical / Trunk Assessment: Normal  Communication   Communication: HOH;Expressive difficulties  Cognition Arousal/Alertness: Awake/alert Behavior During Therapy: WFL for tasks assessed/performed Overall Cognitive Status: Within Functional Limits for tasks assessed  General Comments      Exercises Other Exercises Other Exercises: Patient seen this date for PROM to RUE for shoulder flexion, ABD/ADD, elbow flexion, extension, supination, wrist extension and finger flexion /extension.  Instructed patient on self directed ROM and he is able to demonstrate elbow, wrist and hand  but has difficulty with using left arm to assist with right due to arthritis and increased spasticity in the right shoulder. Attempts at facilitation of movement of the right UE, muscle contraction noted for shoulder flexion and elbow extension with facilitation but no active movement noted. Instructed patient on positioning of right arm with pillow in ABD and elevation.  Sitting balance in chair with cues and assist, tends to lean to right side and has difficulty maintaining.      Assessment/Plan    PT Assessment Patient needs continued PT services  PT Problem List Decreased strength;Decreased range of motion;Decreased activity tolerance;Decreased balance;Decreased mobility;Decreased coordination;Decreased knowledge of use of DME;Decreased safety awareness;Cardiopulmonary status limiting activity          PT Treatment Interventions DME instruction;Gait training;Stair training;Functional mobility training;Therapeutic exercise;Therapeutic activities;Balance training;Neuromuscular re-education;Patient/family education    PT Goals (Current goals can be found in the Care Plan section)  Acute Rehab PT Goals Patient Stated Goal: none stated PT Goal Formulation: With patient/family Time For Goal Achievement: 06/10/16 Potential to Achieve Goals: Good    Frequency 7X/week   Barriers to discharge Inaccessible home environment;Decreased caregiver support (2 person assist to transfer and attempt walking)      Co-evaluation               End of Session Equipment Utilized During Treatment: Gait belt Activity Tolerance: Patient limited by fatigue;Other (comment) (R hemiplegia) Patient left: in chair;with call bell/phone within reach;with chair alarm set;with family/visitor present Nurse Communication: Mobility status;Other (comment) (transfer instructions for back to bed with 2 staff members)         Time: 1031-1100 PT Time Calculation (min) (ACUTE ONLY): 29 min   Charges:   PT  Evaluation $PT Eval Moderate Complexity: 1 Procedure PT Treatments $Therapeutic Activity: 8-22 mins   PT G Codes:        Ivar DrapeStout, Deeandra Jerry E 05/27/2016, 1:56 PM  Samul Dadauth Novalyn Lajara, PT MS Acute Rehab Dept. Number: [facility] [phone] and [facility] [phone]

## 2016-05-27 NOTE — Progress Notes (Signed)
Rehab Admissions Coordinator Note:  Patient was screened by Trish Mage for appropriateness for an Inpatient Acute Rehab Consult.  At this time, we are recommending Skilled Nursing Facility.  Noted social worker has already presented bed offers to patient.  Currently inpatient rehab at [facility] is full and I would not be able to consider patient for admission until at least Tuesday.  If patient is still in the hospital, I will discuss with case manager on Tuesday.  Trish Mage 05/27/2016, 6:55 PM  I can be reached at [phone].

## 2016-05-27 NOTE — Evaluation (Signed)
Clinical/Bedside Swallow Evaluation Patient Details  Name: Matthew Greer MRN: [id number] Date of Birth: 1925-07-12  Today's Date: 05/27/2016 Time: SLP Start Time (ACUTE ONLY): 1015 SLP Stop Time (ACUTE ONLY): 1115 SLP Time Calculation (min) (ACUTE ONLY): 60 min  Past Medical History:  Past Medical History:  Diagnosis Date  . Hypertension   . Stroke [facility]) 2013   "minor" - no deficits   Past Surgical History:  Past Surgical History:  Procedure Laterality Date  . ESOPHAGEAL DILATION  02/24/2016   Procedure: ESOPHAGEAL DILATION;  Surgeon: Midge Minium, MD;  Location: [facility];  Service: Endoscopy;;  . ESOPHAGOGASTRODUODENOSCOPY (EGD) WITH PROPOFOL N/A 02/24/2016   Procedure: ESOPHAGOGASTRODUODENOSCOPY (EGD) WITH PROPOFOL;  Surgeon: Midge Minium, MD;  Location: [facility];  Service: Endoscopy;  Laterality: N/A;  . ESOPHAGOGASTRODUODENOSCOPY ENDOSCOPY  03/2013   Dr. Servando Snare  . THORACOTOMY  1953   HPI:  Muhsin Baires is a 82 y.o. male with a known history of "minor strokes" was presented to the ED earlier with acute onset of weakness in his R upper extremity. Also slurred speech. His symptoms were not severe. Therefore a neurology consult was obtained they recommended a MRI of the brain which confirmed a small CVA. Patient continues to complain of weakness in his upper extremities moreso R side. Some slurred speech. He otherwise denies any difficulty with swallowing no chest pain or shortness of breath. Today, NSG consulted ST services for s/s of Dysphagia. Pt endorses coughing when drinking liquids this morning. Expressive language deficits noted; Dysarthria (evaluation completed yesterday - see note).     Assessment / Plan / Recommendation Clinical Impression  Pt appeared to exhibit overt s/s of aspiration c/b delayed coughing and throat clearing x2 of 4 trials w/ thin liquids via Cup and following aspiration precautions. When given trials of Nectar consistency liquids via cup,  he appeared to adequately tolerate trials w/ no immediate/delayed, overt s/s of aspiration noted. There was no decline in vocal quality post trials assessed; no decline or changes in RR/effort during/post trials. Similar noted w/ trials of purees/soft solids. Oral phase appeared grossly wfl for bolus management w/ trials given(mech soft foods) although min decreased labial sensation w/ slight-min anterior leakage on R side of mouth - pt cleared when cued. Pt required mod assist w/ feeding support d/t being unable to use the RUE d/t weakness(dominant hand). Recommend diet modified to a Dysphagia 3(Mech Soft) w/ Nectar liquids; meds given w/ a puree; Aspiration precautions and feeding support at all meals. ST services to f/u w/ toleration of diet, trials to upgrade, and objective swallow assessment as indicated. Education given to pt/friend and NSG.    Aspiration Risk  Mild aspiration risk    Diet Recommendation  Dysphagia 3 w/ Nectar liquids; aspiration precautions; feeding support and setup at meals d/t RUE weakness.   Medication Administration: Whole meds with puree    Other  Recommendations Recommended Consults:  (Dietician f/u as needed) Oral Care Recommendations: Oral care BID;Staff/trained caregiver to provide oral care Other Recommendations: Order thickener from pharmacy;Prohibited food (jello, ice cream, thin soups);Remove water pitcher;Have oral suction available   Follow up Recommendations Inpatient Rehab;Skilled Nursing facility (TBD)      Frequency and Duration min 3x week  2 weeks       Prognosis Prognosis for Safe Diet Advancement: Fair (-Good) Barriers to Reach Goals: Language deficits      Swallow Study   General Date of Onset: 05/25/16 HPI: Athol Pollak is a 82 y.o. male with a  known history of "minor strokes" was presented to the ED earlier with acute onset of weakness in his R upper extremity. Also slurred speech. His symptoms were not severe. Therefore a neurology  consult was obtained they recommended a MRI of the brain which confirmed a small CVA. Patient continues to complain of weakness in his upper extremities moreso R side. Some slurred speech. He otherwise denies any difficulty with swallowing no chest pain or shortness of breath. Today, NSG consulted ST services for s/s of Dysphagia. Pt endorses coughing when drinking liquids this morning. Expressive language deficits noted; Dysarthria (evaluation completed yesterday - see note).   Type of Study: Bedside Swallow Evaluation Previous Swallow Assessment: none reported Diet Prior to this Study: Regular;Thin liquids (at home; admission) Temperature Spikes Noted: No (wbc 9.8) Respiratory Status: Room air History of Recent Intubation: No Behavior/Cognition: Alert;Cooperative;Pleasant mood (HOH; expressive language deficits) Oral Cavity Assessment: Within Functional Limits Oral Care Completed by SLP: Recent completion by staff Oral Cavity - Dentition: Adequate natural dentition Vision: Functional for self-feeding Self-Feeding Abilities: Able to feed self;Needs assist;Needs set up (R arm/hand (dominent) is too weak; uses Left hand) Patient Positioning: Upright in bed Baseline Vocal Quality: Normal;Low vocal intensity (Dysarthria) Volitional Cough: Strong Volitional Swallow: Able to elicit    Oral/Motor/Sensory Function Overall Oral Motor/Sensory Function: Mild impairment (-Moderate impairment on R side) Facial ROM: Reduced right Facial Symmetry: Abnormal symmetry right Facial Strength: Reduced right Facial Sensation: Reduced right Lingual ROM: Within Functional Limits Lingual Symmetry: Within Functional Limits Lingual Strength: Within Functional Limits Lingual Sensation: Within Functional Limits Velum: Within Functional Limits Mandible: Within Functional Limits   Ice Chips Ice chips: Within functional limits Presentation: Spoon (fed; 3 trials)   Thin Liquid Thin Liquid: Impaired Presentation:  Cup;Self Fed (assisted; 4 trials) Oral Phase Impairments: Reduced labial seal (min on R) Oral Phase Functional Implications:  (none) Pharyngeal  Phase Impairments: Throat Clearing - Delayed;Cough - Delayed (x1 each of 4 total trials)    Nectar Thick Nectar Thick Liquid: Within functional limits Presentation: Cup;Self Fed (~3 ozs)   Honey Thick Honey Thick Liquid: Not tested   Puree Puree: Impaired Presentation: Spoon;Self Fed (7 trials) Oral Phase Impairments: Reduced labial seal (R side) Oral Phase Functional Implications: Right anterior spillage (min) Pharyngeal Phase Impairments:  (none)   Solid   GO   Solid: Within functional limits Presentation: Spoon (fed; 4 trials)         Jerilynn SomKatherine Watson, MS, CCC-SLP Watson,Katherine 05/27/2016,12:52 PM

## 2016-05-27 NOTE — Clinical Social Work Note (Signed)
CSW visited patient at bedside to discuss bed offers. The patient indicated that he is choosing [facility]. CSW attempted to contact his spouse to confirm with no answer. CSW left VM.   Argentina Ponder, MSW, LCSW-A [phone]

## 2016-05-27 NOTE — Progress Notes (Signed)
[facility] - [place] [facility]   PATIENT NAME: Matthew SagoRobert Greer    MR#:  [id number]  DATE OF BIRTH:  12/28/1925  SUBJECTIVE:   Patient with aphasia And right upper and lower extremity weakness  REVIEW OF SYSTEMS:    Review of Systems  Constitutional: Negative.  Negative for chills, fever and malaise/fatigue.  HENT: Negative.  Negative for ear discharge, ear pain, hearing loss, nosebleeds and sore throat.   Eyes: Negative.  Negative for blurred vision and pain.  Respiratory: Negative.  Negative for cough, hemoptysis, shortness of breath and wheezing.   Cardiovascular: Negative.  Negative for chest pain, palpitations and leg swelling.  Gastrointestinal: Negative.  Negative for abdominal pain, blood in stool, diarrhea, nausea and vomiting.  Genitourinary: Negative.  Negative for dysuria.  Musculoskeletal: Negative.  Negative for back pain.  Skin: Negative.   Neurological: Positive for sensory change, speech change and focal weakness. Negative for dizziness, tremors, seizures and headaches.  Endo/Heme/Allergies: Negative.  Does not bruise/bleed easily.  Psychiatric/Behavioral: Negative.  Negative for depression, hallucinations and suicidal ideas.    Tolerating Diet: yes      DRUG ALLERGIES:   Allergies  Allergen Reactions  . Finasteride Nausea Only    confusion  . Lisinopril Other (See Comments)    Confusion    VITALS:  Blood pressure (!) 181/100, pulse 86, temperature 98.6 F (37 C), temperature source Oral, resp. rate 20, height 5\' 11"  (1.803 m), weight 83 kg (183 lb), SpO2 96 %.  PHYSICAL EXAMINATION:   Physical Exam    LABORATORY PANEL:   CBC  Recent Labs Lab 05/25/16 1258  WBC 9.8  HGB 14.3  HCT 42.9  PLT 272   ------------------------------------------------------------------------------------------------------------------  Chemistries   Recent Labs Lab 05/25/16 1258  NA 139  K 4.1  CL 106  CO2 28  GLUCOSE 100*  BUN 21*   CREATININE 1.28*  CALCIUM 9.8  AST 22  ALT 17  ALKPHOS 76  BILITOT 0.4   ------------------------------------------------------------------------------------------------------------------  Cardiac Enzymes  Recent Labs Lab 05/25/16 1258  TROPONINI <0.03   ------------------------------------------------------------------------------------------------------------------  RADIOLOGY:  Mr Brain Wo Contrast  Result Date: 05/25/2016 CLINICAL DATA:  Slurred speech.  Right-sided weakness. EXAM: MRI HEAD WITHOUT CONTRAST TECHNIQUE: Multiplanar, multiecho pulse sequences of the brain and surrounding structures were obtained without intravenous contrast. COMPARISON:  CT 05/25/2016.  MRI 02/12/2011 FINDINGS: Brain: Small areas of restricted diffusion in the left internal capsule compatible with acute infarct. No other areas of acute infarct. Generalized atrophy without hydrocephalus. Progression of periventricular and deep white matter hyperintensities since 2012 most consistent with chronic microvascular ischemia. Negative for hemorrhage or mass. No shift of the midline structures. Vascular: Normal arterial flow voids. Skull and upper cervical spine: Negative Sinuses/Orbits: Mucosal edema paranasal sinuses with air-fluid level sphenoid sinus. Normal orbit. Other: None IMPRESSION: Small area of acute infarct left internal capsule Progressive chronic microvascular ischemic changes in the white matter since 2012. Electronically Signed   By: Marlan Palauharles  Clark M.D.   On: 05/25/2016 16:41   [place] Carotid Bilateral (at [facility] And Ap Only)  Result Date: 05/26/2016 CLINICAL DATA:  82 year old male with a history of cerebral vascular accident. Cardiovascular risk factors include hypertension, known prior stroke/TIA EXAM: BILATERAL CAROTID DUPLEX ULTRASOUND TECHNIQUE: Wallace CullensGray scale imaging, color Doppler and duplex ultrasound were performed of bilateral carotid and vertebral arteries in the neck. COMPARISON:  02/12/2011  FINDINGS: Criteria: Quantification of carotid stenosis is based on velocity parameters that correlate the residual internal carotid diameter with NASCET-based stenosis  levels, using the diameter of the distal internal carotid lumen as the denominator for stenosis measurement. The following velocity measurements were obtained: RIGHT ICA:  Systolic 61 cm/sec, Diastolic 18 cm/sec CCA:  84 cm/sec SYSTOLIC ICA/CCA RATIO:  0.7 ECA:  69 cm/sec LEFT ICA:  Systolic 54 cm/sec, Diastolic 10 cm/sec CCA:  89 cm/sec SYSTOLIC ICA/CCA RATIO:  0.6 ECA:  62 cm/sec Right Brachial SBP: Not acquired Left Brachial SBP: Not acquired RIGHT CAROTID ARTERY: No significant calcified disease of the right common carotid artery. Intermediate waveform maintained. Mild plaque without significant calcifications at the right carotid bifurcation. Low resistance waveform of the right ICA. No significant tortuosity. RIGHT VERTEBRAL ARTERY: Antegrade flow with low resistance waveform. LEFT CAROTID ARTERY: No significant calcified disease of the left common carotid artery. Intermediate waveform maintained. Mild plaque at the left carotid bifurcation without significant calcifications. Low resistance waveform of the left ICA. LEFT VERTEBRAL ARTERY:  Antegrade flow with low resistance waveform. IMPRESSION: Color duplex indicates minimal homogeneous plaque, with no hemodynamically significant stenosis by duplex criteria in the extracranial cerebrovascular circulation. Signed, Yvone Neu. Loreta Ave, DO [facility] Radiology Electronically Signed   By: Gilmer Mor D.O.   On: 05/26/2016 11:24   Dg Chest Portable 1 View  Result Date: 05/25/2016 CLINICAL DATA:  Acute onset of upper extremity weakness, worse on the right. Slurred speech. EXAM: PORTABLE CHEST 1 VIEW COMPARISON:  CT 07/26/2012 and 01/01/2012.  Radiographs 07/12/2011. FINDINGS: 1438 hour. Lower lung volumes and mild patient rotation to the right.  Allowing for this, cardiomegaly and aortic atherosclerosis are grossly stable. There is mild bibasilar atelectasis, but no confluent airspace opacity or significant pleural effusion. Biapical pleural thickening appears unchanged. No acute osseous findings are seen. IMPRESSION: No definite acute findings allowing for suboptimal inspiration and patient rotation. Electronically Signed   By: Carey Bullocks M.D.   On: 05/25/2016 14:57   Mr Maxine Glenn Head/brain ZO Cm  Result Date: 05/26/2016 CLINICAL DATA:  Acute infarct of the left internal capsule. Slurred speech. Right-sided weakness. EXAM: MRA HEAD WITHOUT CONTRAST TECHNIQUE: Angiographic images of the Circle of Willis were obtained using MRA technique without intravenous contrast. COMPARISON:  MRI brain from the same day. FINDINGS: Moderate tortuosity is present and may high cervical left internal carotid artery is. There is mild irregularity within the cavernous internal carotid arteries bilaterally, right greater than left. No significant stenosis is present. The ICA terminus is normal bilaterally. There is artifactual signal loss in the distal M1 segments and proximal M2 segments bilaterally. More distal MCA and ACA branch vessels are within normal limits. The left internal carotid artery is the dominant vessel. The vertebrobasilar junction is not well visualized due to artifact. The right vertebral artery is hypoplastic and may terminate in the PICA. Mild narrowing of the proximal basilar artery is less than 50%. The more distal basilar artery is normal. There is artifactual signal loss the proximal P2 segments bilaterally. There is some attenuation of distal PCA branch vessels bilaterally. IMPRESSION: 1. Atherosclerotic changes within the cavernous internal carotid arteries bilaterally and proximal basilar artery without a significant stenosis relative to the more distal vessels. 2. Mild distal small vessel disease. 3. Tortuosity of the cervical left internal  carotid artery is likely related to hypertension. 4. No acute or focal lesion to explain the patient's acute infarct. Electronically Signed   By: Marin Roberts M.D.   On: 05/26/2016 10:17   Ct Head Code Stroke W/o Cm  Result Date: 05/25/2016 CLINICAL DATA:  Code stroke. Stroke symptoms. Last seen normal 11 a.m. EXAM: CT HEAD WITHOUT CONTRAST TECHNIQUE: Contiguous axial images were obtained from the base of the skull through the vertex without intravenous contrast. COMPARISON:  CT head 07/12/2011 FINDINGS: Brain: Negative for acute infarct. Negative for acute hemorrhage or mass Generalized atrophy. Hypodensity in the white matter bilaterally similar to the prior study and consistent with chronic microvascular ischemia. Vascular: No hyperdense vessel or unexpected calcification. Skull: Negative Sinuses/Orbits: Mucosal edema paranasal sinuses.  No orbital lesion. Other: None ASPECTS (Alberta Stroke Program Early CT Score) - Ganglionic level infarction (caudate, lentiform nuclei, internal capsule, insula, M1-M3 cortex): 7 - Supraganglionic infarction (M4-M6 cortex): 3 Total score (0-10 with 10 being normal): 10 IMPRESSION: 1. No acute intracranial abnormality. Atrophy with chronic microvascular ischemia stable from the prior study. 2. ASPECTS is 10 These results were called by telephone at the time of interpretation on 05/25/2016 at 1:07 pm to Dr. Jene Every , who verbally acknowledged these results. Electronically Signed   By: Marlan Palau M.D.   On: 05/25/2016 13:08     ASSESSMENT AND PLAN:    82 year old male presented with acute onset of right upper extremity weakness and found to have acute CVA  1. 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 Essential hypertension: Increase dose of Norvasc and metoprolol   3. Hyperlipidemia: On high intensity statin  Physical therapy is recommending squamous and facilitate discharge likely tomorrow  Management plans discussed with the patient and he is in agreement.  CODE STATUS: FULL  TOTAL TIME TAKING CARE OF THIS PATIENT: 26 minutes.     POSSIBLE D/C tomorrow, DEPENDING ON CLINICAL CONDITION.   Daylon Lafavor M.D on 05/27/2016 at 8:03 AM  Between 7am to 6pm - Pager - [phone] After 6pm go to [email or web address] - password [email or web address]  [facility] [place] [facility]  Office  [phone]  CC: Primary care physician; Rolm Gala, MD  Note: This dictation was prepared with Dragon dictation along with smaller phrase technology. Any transcriptional errors that result from this process are unintentional.

## 2016-05-28 LAB — CREATININE, SERUM
Creatinine, Ser: 1.15 mg/dL (ref 0.61–1.24)
GFR, EST NON AFRICAN AMERICAN: 55 mL/min — AB (ref >60)

## 2016-05-28 MED ORDER — CLOPIDOGREL BISULFATE 75 MG PO TABS: 75.0000 mg | ORAL_TABLET | Freq: Every day | ORAL | 0 refills | Status: DC

## 2016-05-28 MED ORDER — METOPROLOL TARTRATE 50 MG PO TABS: 50.0000 mg | ORAL_TABLET | Freq: Two times a day (BID) | ORAL | 0 refills | Status: AC

## 2016-05-28 MED ORDER — AMLODIPINE BESYLATE 10 MG PO TABS: 10.0000 mg | ORAL_TABLET | Freq: Every day | ORAL | 0 refills | Status: DC

## 2016-05-28 NOTE — Plan of Care (Signed)
Problem: Education: Goal: Knowledge of [facility] Education information/materials will improve Outcome: Progressing VSS, free of falls during shift.  Denies pain, nausea.  No complaints overnight.  NIHSS 9 for R-sided weakness, slurred speech, facial droop, ataxia.  Bed in low position, call bell within reach.  WCTM.

## 2016-05-28 NOTE — Clinical Social Work Note (Signed)
Patient to discharge to [facility], Room 202A via transport by family. The facility and the family are aware and in agreement. The packet has been delivered, and the facility has confirmed receipt of the dc summary and fl 2. CSW will con't to follow pending any additional dc needs.  Argentina Ponder, MSW, LCSW-A [phone]

## 2016-05-28 NOTE — Care Management Note (Signed)
Case Management Note  Patient Details  Name: Matthew Greer MRN: [id number] Date of Birth: [birth year]  Subjective/Objective:       Per weekend SWer, [name] is pending discharge to the [facility]              Action/Plan:   Expected Discharge Date:                  Expected Discharge Plan:     In-House Referral:     Discharge planning Services     Post Acute Care Choice:    Choice offered to:     DME Arranged:    DME Agency:     HH Arranged:    HH Agency:     Status of Service:     If discussed at [employer] of Stay Meetings, dates discussed:    Additional Comments:  [name], RN 05/28/2016, 12:14 PM

## 2016-05-28 NOTE — Clinical Social Work Note (Addendum)
CSW currently working on [employer] with [facility] [place] to see if they can accept the patient today. Admissions is working on specific bed placement and will contact this CSW when ready for acceptance. CSW con't to follow.  Argentina Ponder, MSW, LCSW-A [phone]

## 2016-05-28 NOTE — Discharge Summary (Signed)
[facility] - [place] [facility]   PATIENT NAME: Matthew SagoRobert Greer    MR#:  [id number]  DATE OF BIRTH:  04/02/1926  DATE OF ADMISSION:  05/25/2016 ADMITTING PHYSICIAN: Auburn BilberryShreyang Patel, MD  DATE OF DISCHARGE: 05/28/2016  PRIMARY CARE PHYSICIAN: Rolm GalaGRANDIS, HEIDI, MD    ADMISSION DIAGNOSIS:  Slurred speech [R47.81]  DISCHARGE DIAGNOSIS:  Active Problems:   CVA (cerebral vascular accident) (HCC)   SECONDARY DIAGNOSIS:   Past Medical History:  Diagnosis Date  . Hypertension   . Stroke [facility](HCC) 2013   "minor" - no deficits    HOSPITAL COURSE:   82 year old male presented with acute onset of right upper extremity weakness and found to have acute CVA  1. 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 Essential hypertension: Increase dose of Norvasc and metoprolol   3. Hyperlipidemia: On high intensity statin  DISCHARGE CONDITIONS AND DIET:  Dysphagia 3 diet nectar thick Extra gravy on chopped meats; potatoes. Likes MILK - Nectar consistency but needs a CUP to pour it into.  Likes bananas at breakfast meals  Stable for discharge  CONSULTS OBTAINED:  Treatment Team:  Kym GroomNeuro1 [facility], MD  DRUG ALLERGIES:   Allergies  Allergen Reactions  . Finasteride Nausea Only    confusion  . Lisinopril Other (See Comments)    Confusion    DISCHARGE MEDICATIONS:   Current Discharge Medication List    START taking these medications   Details  amLODipine (NORVASC) 10 MG tablet Take 1 tablet (10 mg total) by mouth daily. Qty: 30 tablet, Refills: 0    clopidogrel (PLAVIX) 75 MG tablet Take 1 tablet (75 mg total) by mouth daily. Qty: 30 tablet, Refills: 0      CONTINUE these medications which have  CHANGED   Details  metoprolol (LOPRESSOR) 50 MG tablet Take 1 tablet (50 mg total) by mouth 2 (two) times daily. Qty: 60 tablet, Refills: 0      CONTINUE these medications which have NOT CHANGED   Details  acetaminophen (TYLENOL) 325 MG tablet Take 650 mg by mouth every 6 (six) hours as needed.    aspirin EC 81 MG tablet Take by mouth.    atorvastatin (LIPITOR) 10 MG tablet Take by mouth.    loratadine (CLARITIN) 5 MG chewable tablet Chew by mouth.              Today   CHIEF COMPLAINT:   No acute issues overnight. Right lower extremity is improving slowly   VITAL SIGNS:  Blood pressure (!) 163/74, pulse 70, temperature 98.6 F (37 C), temperature source Oral, resp. rate 20, height 5\' 11"  (1.803 m), weight 83 kg (183 lb), SpO2 92 %.   REVIEW OF SYSTEMS:  Review of Systems  Constitutional: Negative.  Negative for chills, fever and malaise/fatigue.  HENT: Negative.  Negative for ear discharge, ear pain, hearing loss, nosebleeds and sore throat.   Eyes: Negative.  Negative for blurred vision and pain.  Respiratory: Negative.  Negative for cough, hemoptysis, shortness of breath and wheezing.   Cardiovascular: Negative.  Negative for chest pain, palpitations and leg swelling.  Gastrointestinal: Negative.  Negative for abdominal pain, blood in stool, diarrhea, nausea and vomiting.  Genitourinary: Negative.  Negative for dysuria.  Musculoskeletal: Negative.  Negative for back pain.  Skin: Negative.  Neurological: Positive for sensory change, speech change and focal weakness. Negative for dizziness, tremors, seizures and headaches.  Endo/Heme/Allergies: Negative.  Does not bruise/bleed easily.  Psychiatric/Behavioral: Negative.  Negative for depression, hallucinations and suicidal ideas.     PHYSICAL EXAMINATION:  GENERAL:  82 y.o.-year-old patient lying in the bed with no acute distress.  NECK:  Supple, no jugular venous distention. No thyroid enlargement, no  tenderness.  LUNGS: Normal breath sounds bilaterally, no wheezing, rales,rhonchi  No use of accessory muscles of respiration.  CARDIOVASCULAR: S1, S2 normal. No murmurs, rubs, or gallops.  ABDOMEN: Soft, non-tender, non-distended. Bowel sounds present. No organomegaly or mass.  EXTREMITIES: No pedal edema, cyanosis, or clubbing.  PSYCHIATRIC: The patient is alert and oriented x 3.  SKIN: No obvious rash, lesion, or ulcer.  Neuro: Patient with expressive aphasia. Right lower extremity is improving with 4-5 strength right upper extremity 3 out of 5 DATA REVIEW:   CBC  Recent Labs Lab 05/25/16 1258  WBC 9.8  HGB 14.3  HCT 42.9  PLT 272    Chemistries   Recent Labs Lab 05/25/16 1258 05/28/16 0458  NA 139  --   K 4.1  --   CL 106  --   CO2 28  --   GLUCOSE 100*  --   BUN 21*  --   CREATININE 1.28* 1.15  CALCIUM 9.8  --   AST 22  --   ALT 17  --   ALKPHOS 76  --   BILITOT 0.4  --     Cardiac Enzymes  Recent Labs Lab 05/25/16 1258  TROPONINI <0.03    Microbiology Results  @[email or web address] @  RADIOLOGY:  [place] Carotid Bilateral (at [facility] And Ap Only)  Result Date: 05/26/2016 CLINICAL DATA:  82 year old male with a history of cerebral vascular accident. Cardiovascular risk factors include hypertension, known prior stroke/TIA EXAM: BILATERAL CAROTID DUPLEX ULTRASOUND TECHNIQUE: Wallace Cullens scale imaging, color Doppler and duplex ultrasound were performed of bilateral carotid and vertebral arteries in the neck. COMPARISON:  02/12/2011 FINDINGS: Criteria: Quantification of carotid stenosis is based on velocity parameters that correlate the residual internal carotid diameter with NASCET-based stenosis levels, using the diameter of the distal internal carotid lumen as the denominator for stenosis measurement. The following velocity measurements were obtained: RIGHT ICA:  Systolic 61 cm/sec, Diastolic 18 cm/sec CCA:  84 cm/sec SYSTOLIC ICA/CCA RATIO:  0.7 ECA:  69 cm/sec LEFT ICA:  Systolic  54 cm/sec, Diastolic 10 cm/sec CCA:  89 cm/sec SYSTOLIC ICA/CCA RATIO:  0.6 ECA:  62 cm/sec Right Brachial SBP: Not acquired Left Brachial SBP: Not acquired RIGHT CAROTID ARTERY: No significant calcified disease of the right common carotid artery. Intermediate waveform maintained. Mild plaque without significant calcifications at the right carotid bifurcation. Low resistance waveform of the right ICA. No significant tortuosity. RIGHT VERTEBRAL ARTERY: Antegrade flow with low resistance waveform. LEFT CAROTID ARTERY: No significant calcified disease of the left common carotid artery. Intermediate waveform maintained. Mild plaque at the left carotid bifurcation without significant calcifications. Low resistance waveform of the left ICA. LEFT VERTEBRAL ARTERY:  Antegrade flow with low resistance waveform. IMPRESSION: Color duplex indicates minimal homogeneous plaque, with no hemodynamically significant stenosis by duplex criteria in the extracranial cerebrovascular circulation. Signed, Yvone Neu. Loreta Ave, DO [facility] Radiology Electronically Signed   By: Gilmer Mor D.O.   On: 05/26/2016 11:24   Mr Maxine Glenn Head/brain UM Cm  Result Date: 05/26/2016 CLINICAL DATA:  Acute infarct of the left internal capsule. Slurred  speech. Right-sided weakness. EXAM: MRA HEAD WITHOUT CONTRAST TECHNIQUE: Angiographic images of the Circle of Willis were obtained using MRA technique without intravenous contrast. COMPARISON:  MRI brain from the same day. FINDINGS: Moderate tortuosity is present and may high cervical left internal carotid artery is. There is mild irregularity within the cavernous internal carotid arteries bilaterally, right greater than left. No significant stenosis is present. The ICA terminus is normal bilaterally. There is artifactual signal loss in the distal M1 segments and proximal M2 segments bilaterally. More distal MCA and ACA branch vessels are within normal  limits. The left internal carotid artery is the dominant vessel. The vertebrobasilar junction is not well visualized due to artifact. The right vertebral artery is hypoplastic and may terminate in the PICA. Mild narrowing of the proximal basilar artery is less than 50%. The more distal basilar artery is normal. There is artifactual signal loss the proximal P2 segments bilaterally. There is some attenuation of distal PCA branch vessels bilaterally. IMPRESSION: 1. Atherosclerotic changes within the cavernous internal carotid arteries bilaterally and proximal basilar artery without a significant stenosis relative to the more distal vessels. 2. Mild distal small vessel disease. 3. Tortuosity of the cervical left internal carotid artery is likely related to hypertension. 4. No acute or focal lesion to explain the patient's acute infarct. Electronically Signed   By: Marin Roberts M.D.   On: 05/26/2016 10:17      Management plans discussed with the patient and he is in agreement. Stable for discharge snf  Patient should follow up with pcp  CODE STATUS:     Code Status Orders        Start     Ordered   05/25/16 2225  Full code  Continuous     05/25/16 2225    Code Status History    Date Active Date Inactive Code Status Order ID Comments User Context   This patient has a current code status but no historical code status.    Advance Directive Documentation   Flowsheet Row Most Recent Value  Type of Advance Directive  Healthcare Power of Attorney  Pre-existing out of facility DNR order (yellow form or pink MOST form)  No data  "MOST" Form in Place?  No data      TOTAL TIME TAKING CARE OF THIS PATIENT: 38 minutes.    Note: This dictation was prepared with Dragon dictation along with smaller phrase technology. Any transcriptional errors that result from this process are unintentional.  Siomara Burkel M.D on 05/28/2016 at 7:21 AM  Between 7am to 6pm - Pager - [phone] After 6pm go  to [email or web address] - Social research officer, government  [facility] [place] [facility]  Office  [phone]  CC: Primary care physician; Rolm Gala, MD

## 2016-05-28 NOTE — Progress Notes (Signed)
Pt being discharged to the [facility] in [place]. Pt's spouse refused EMS and will be transporting the patient to the [facility]. Clinical research associate gave report to [place], Charity fundraiser at the [facility]. Pt being discharged on room air, no distress noted. Otilio Jefferson, RN

## 2016-08-15 ENCOUNTER — Emergency Department (HOSPITAL_COMMUNITY): Payer: Medicare Other

## 2016-08-15 ENCOUNTER — Inpatient Hospital Stay (HOSPITAL_COMMUNITY)
Admission: EM | Admit: 2016-08-15 | Discharge: 2016-08-18 | DRG: 605 | Disposition: A | Discharge status: Home / Self Care | Payer: Medicare Other | Attending: Internal Medicine | Admitting: Internal Medicine

## 2016-08-15 ENCOUNTER — Encounter (HOSPITAL_COMMUNITY): Payer: Self-pay | Admitting: Emergency Medicine

## 2016-08-15 DIAGNOSIS — Z7982 Long term (current) use of aspirin: Secondary | ICD-10-CM | POA: Diagnosis not present

## 2016-08-15 DIAGNOSIS — D62 Acute posthemorrhagic anemia: Secondary | ICD-10-CM | POA: Diagnosis present

## 2016-08-15 DIAGNOSIS — I63312 Cerebral infarction due to thrombosis of left middle cerebral artery: Secondary | ICD-10-CM | POA: Diagnosis not present

## 2016-08-15 DIAGNOSIS — Z7901 Long term (current) use of anticoagulants: Secondary | ICD-10-CM

## 2016-08-15 DIAGNOSIS — E785 Hyperlipidemia, unspecified: Secondary | ICD-10-CM | POA: Diagnosis present

## 2016-08-15 DIAGNOSIS — E7800 Pure hypercholesterolemia, unspecified: Secondary | ICD-10-CM | POA: Diagnosis present

## 2016-08-15 DIAGNOSIS — R195 Other fecal abnormalities: Secondary | ICD-10-CM

## 2016-08-15 DIAGNOSIS — I639 Cerebral infarction, unspecified: Secondary | ICD-10-CM | POA: Diagnosis present

## 2016-08-15 DIAGNOSIS — N132 Hydronephrosis with renal and ureteral calculous obstruction: Secondary | ICD-10-CM | POA: Diagnosis present

## 2016-08-15 DIAGNOSIS — M25551 Pain in right hip: Secondary | ICD-10-CM | POA: Diagnosis present

## 2016-08-15 DIAGNOSIS — Z86718 Personal history of other venous thrombosis and embolism: Secondary | ICD-10-CM | POA: Diagnosis not present

## 2016-08-15 DIAGNOSIS — I10 Essential (primary) hypertension: Secondary | ICD-10-CM | POA: Diagnosis present

## 2016-08-15 DIAGNOSIS — S300XXA Contusion of lower back and pelvis, initial encounter: Secondary | ICD-10-CM | POA: Diagnosis present

## 2016-08-15 DIAGNOSIS — I69351 Hemiplegia and hemiparesis following cerebral infarction affecting right dominant side: Secondary | ICD-10-CM

## 2016-08-15 DIAGNOSIS — W06XXXA Fall from bed, initial encounter: Secondary | ICD-10-CM | POA: Diagnosis present

## 2016-08-15 DIAGNOSIS — N135 Crossing vessel and stricture of ureter without hydronephrosis: Secondary | ICD-10-CM | POA: Diagnosis present

## 2016-08-15 DIAGNOSIS — S7001XA Contusion of right hip, initial encounter: Secondary | ICD-10-CM | POA: Diagnosis present

## 2016-08-15 DIAGNOSIS — Z7902 Long term (current) use of antithrombotics/antiplatelets: Secondary | ICD-10-CM | POA: Diagnosis not present

## 2016-08-15 DIAGNOSIS — Z87891 Personal history of nicotine dependence: Secondary | ICD-10-CM | POA: Diagnosis not present

## 2016-08-15 DIAGNOSIS — I82401 Acute embolism and thrombosis of unspecified deep veins of right lower extremity: Secondary | ICD-10-CM | POA: Diagnosis not present

## 2016-08-15 LAB — BASIC METABOLIC PANEL
Anion gap: 7 (ref 5–15)
BUN: 24 mg/dL — ABNORMAL HIGH (ref 6–20)
CHLORIDE: 108 mmol/L (ref 101–111)
CO2: 23 mmol/L (ref 22–32)
Calcium: 9.3 mg/dL (ref 8.9–10.3)
Creatinine, Ser: 1.19 mg/dL (ref 0.61–1.24)
GFR calc Af Amer: >60 mL/min (ref >60)
GFR calc non Af Amer: 53 mL/min — ABNORMAL LOW (ref >60)
Glucose, Bld: 93 mg/dL (ref 65–99)
POTASSIUM: 3.7 mmol/L (ref 3.5–5.1)
SODIUM: 138 mmol/L (ref 135–145)

## 2016-08-15 LAB — CBC WITH DIFFERENTIAL/PLATELET
Basophils Absolute: 0 10*3/uL (ref 0.0–0.1)
Basophils Relative: 0 %
Eosinophils Absolute: 0.2 10*3/uL (ref 0.0–0.7)
Eosinophils Relative: 2 %
HEMATOCRIT: 28.7 % — AB (ref 39.0–52.0)
HEMOGLOBIN: 9.6 g/dL — AB (ref 13.0–17.0)
LYMPHS ABS: 1.5 10*3/uL (ref 0.7–4.0)
LYMPHS PCT: 14 %
MCH: 30.7 pg (ref 26.0–34.0)
MCHC: 33.4 g/dL (ref 30.0–36.0)
MCV: 91.7 fL (ref 78.0–100.0)
MONOS PCT: 11 %
Monocytes Absolute: 1.2 10*3/uL — ABNORMAL HIGH (ref 0.1–1.0)
NEUTROS ABS: 7.9 10*3/uL — AB (ref 1.7–7.7)
NEUTROS PCT: 73 %
Platelets: 421 10*3/uL — ABNORMAL HIGH (ref 150–400)
RBC: 3.13 MIL/uL — AB (ref 4.22–5.81)
RDW: 13.5 % (ref 11.5–15.5)
WBC: 10.8 10*3/uL — AB (ref 4.0–10.5)

## 2016-08-15 LAB — POC OCCULT BLOOD, ED: FECAL OCCULT BLD: POSITIVE — AB

## 2016-08-15 LAB — TYPE AND SCREEN
ABO/RH(D): O NEG
ANTIBODY SCREEN: NEGATIVE

## 2016-08-15 LAB — PROTIME-INR
INR: 1.36
PROTHROMBIN TIME: 16.9 s — AB (ref 11.4–15.2)

## 2016-08-15 LAB — ABO/RH: ABO/RH(D): O NEG

## 2016-08-15 MED ORDER — OXYCODONE HCL 5 MG PO TABS
5.0000 mg | ORAL_TABLET | ORAL | Status: DC | PRN
  Administered 2016-08-16 – 2016-08-18 (×5): 5 mg via ORAL
  Filled 2016-08-15 (×5): qty 1

## 2016-08-15 MED ORDER — CYCLOBENZAPRINE HCL 5 MG PO TABS
7.5000 mg | ORAL_TABLET | Freq: Three times a day (TID) | ORAL | Status: DC | PRN
  Administered 2016-08-17 – 2016-08-18 (×4): 7.5 mg via ORAL
  Filled 2016-08-15 (×4): qty 2

## 2016-08-15 MED ORDER — IOPAMIDOL (ISOVUE-300) INJECTION 61%
INTRAVENOUS | Status: AC
  Filled 2016-08-15: qty 100

## 2016-08-15 MED ORDER — IOPAMIDOL (ISOVUE-300) INJECTION 61%
100.0000 mL | Freq: Once | INTRAVENOUS | Status: AC | PRN
  Administered 2016-08-15: 100 mL via INTRAVENOUS

## 2016-08-15 MED ORDER — LORATADINE 10 MG PO TABS
5.0000 mg | ORAL_TABLET | Freq: Every day | ORAL | Status: DC
  Administered 2016-08-15 – 2016-08-18 (×4): 5 mg via ORAL
  Filled 2016-08-15 (×4): qty 1

## 2016-08-15 MED ORDER — METOPROLOL TARTRATE 50 MG PO TABS
50.0000 mg | ORAL_TABLET | Freq: Two times a day (BID) | ORAL | Status: DC
  Administered 2016-08-15 – 2016-08-18 (×5): 50 mg via ORAL
  Filled 2016-08-15 (×6): qty 1

## 2016-08-15 MED ORDER — ACETAMINOPHEN 650 MG RE SUPP: 650.0000 mg | Freq: Four times a day (QID) | RECTAL | Status: DC | PRN

## 2016-08-15 MED ORDER — HYDROMORPHONE HCL 1 MG/ML IJ SOLN
0.5000 mg | INTRAMUSCULAR | Status: AC | PRN
  Administered 2016-08-15 (×2): 0.5 mg via INTRAVENOUS
  Filled 2016-08-15 (×2): qty 0.5

## 2016-08-15 MED ORDER — ACETAMINOPHEN 325 MG PO TABS: 650.0000 mg | ORAL_TABLET | Freq: Four times a day (QID) | ORAL | Status: DC | PRN

## 2016-08-15 MED ORDER — SODIUM CHLORIDE 0.9 % IV SOLN
INTRAVENOUS | Status: DC
  Administered 2016-08-15 – 2016-08-17 (×4): via INTRAVENOUS

## 2016-08-15 MED ORDER — AMLODIPINE BESYLATE 10 MG PO TABS
10.0000 mg | ORAL_TABLET | Freq: Every day | ORAL | Status: DC
  Administered 2016-08-16 – 2016-08-18 (×3): 10 mg via ORAL
  Filled 2016-08-15 (×3): qty 1

## 2016-08-15 MED ORDER — ATORVASTATIN CALCIUM 10 MG PO TABS
10.0000 mg | ORAL_TABLET | Freq: Every day | ORAL | Status: DC
  Administered 2016-08-16 – 2016-08-17 (×2): 10 mg via ORAL
  Filled 2016-08-15 (×2): qty 1

## 2016-08-15 NOTE — ED Provider Notes (Signed)
[facility]-EMERGENCY DEPT Provider Note   CSN: [id number] Arrival date & time: 08/15/16  1026     History   Chief Complaint Chief Complaint  Patient presents with  . Hip Pain    HPI JERVIS TRAPANI is a 83 y.o. male.  HPI Patient presents to the emergency room for evaluation of right hip pain.  Patient has a history of prior stroke resulting in right-sided weakness.  Patient had a fall about 1 week ago.  She developed a bruise on his right hip but was not having any significant pain. Patient was doing physical therapy and riding a bicycle this morning as part of his therapy.  The patient suddenly started experiencing severe pain in his right hip. Patient is having intermittent spasms in that area as well. He denies any numbness or weakness. No chills. Past Medical History:  Diagnosis Date  . Hypertension   . Stroke [facility]) 2013   "minor" - no deficits    Patient Active Problem List   Diagnosis Date Noted  . CVA (cerebral vascular accident) (HCC) 05/25/2016  . Problems with swallowing and mastication   . Stricture and stenosis of esophagus   . Benign essential hypertension 01/27/2016  . Kidney stones 01/27/2016  . Other nonspecific abnormal finding of lung field 01/27/2016  . Pure hypercholesterolemia 01/27/2016  . Stroke (HCC) 01/27/2016  . 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 Rotator cuff tendonitis 11/10/2013  . Hearing loss 04/09/2013  . Eczema 07/26/2012  . Abdominal pain 12/27/2011    Past Surgical History:  Procedure Laterality Date  . ESOPHAGEAL DILATION  02/24/2016   Procedure: ESOPHAGEAL DILATION;  Surgeon: Midge Minium, MD;  Location: [facility];  Service: Endoscopy;;  . ESOPHAGOGASTRODUODENOSCOPY (EGD) WITH PROPOFOL N/A 02/24/2016   Procedure: ESOPHAGOGASTRODUODENOSCOPY (EGD) WITH PROPOFOL;  Surgeon: Midge Minium, MD;  Location: [facility];  Service: Endoscopy;  Laterality: N/A;  . ESOPHAGOGASTRODUODENOSCOPY ENDOSCOPY  03/2013     Dr. Servando Snare  . THORACOTOMY  1953       Home Medications    Prior to Admission medications   Medication Sig Start Date End Date Taking? Authorizing Provider  acetaminophen (TYLENOL) 325 MG tablet Take 650 mg by mouth every 6 (six) hours as needed.    Historical Provider, MD  amLODipine (NORVASC) 10 MG tablet Take 1 tablet (10 mg total) by mouth daily. 05/28/16   Adrian Saran, MD  aspirin EC 81 MG tablet Take by mouth.    Historical Provider, MD  atorvastatin (LIPITOR) 10 MG tablet Take by mouth. 06/10/15 06/09/16  Historical Provider, MD  clopidogrel (PLAVIX) 75 MG tablet Take 1 tablet (75 mg total) by mouth daily. 05/28/16   Adrian Saran, MD  loratadine (CLARITIN) 5 MG chewable tablet Chew by mouth.    Historical Provider, MD  metoprolol (LOPRESSOR) 50 MG tablet Take 1 tablet (50 mg total) by mouth 2 (two) times daily. 05/28/16   Adrian Saran, MD    Family History History reviewed. No pertinent family history.  Social History Social History  Substance Use Topics  . Smoking status: Former Games developer  . Smokeless tobacco: Never Used  . Alcohol use No     Allergies   Finasteride and Lisinopril   Review of Systems Review of Systems  All other systems reviewed and are negative.    Physical Exam Updated Vital Signs BP 121/68 (BP Location: Left Arm)   Pulse 71   Temp 98 F (36.7 C) (Axillary)   Resp 15   SpO2 95%  Physical Exam  Constitutional: No distress.  HENT:  Head: Normocephalic and atraumatic.  Right Ear: External ear normal.  Left Ear: External ear normal.  Eyes: Conjunctivae are normal. Right eye exhibits no discharge. Left eye exhibits no discharge. No scleral icterus.  Neck: Neck supple. No tracheal deviation present.  Cardiovascular: Normal rate, regular rhythm and intact distal pulses.   Pulmonary/Chest: Effort normal and breath sounds normal. No stridor. No respiratory distress. He has no wheezes. He has no rales.  Abdominal: Soft. Bowel sounds are normal. He  exhibits no distension. There is no tenderness. There is no rebound and no guarding.  Musculoskeletal: He exhibits no edema.       Right hip: He exhibits tenderness and bony tenderness.  Bruising noted in the right hip region  Neurological: He is alert. No cranial nerve deficit ( facial droop, extraocular movements intact, no slurred speech ) or sensory deficit. He exhibits normal muscle tone. He displays no seizure activity. Coordination normal.  Right-sided hemiparesis, right facial droop, contraction of the right upper extremity  Skin: Skin is warm and dry. No rash noted.  Psychiatric: He has a normal mood and affect.  Nursing note and vitals reviewed.    ED Treatments / Results  Labs (all labs ordered are listed, but only abnormal results are displayed) Labs Reviewed  BASIC METABOLIC PANEL - Abnormal; Notable for the following:       Result Value   BUN 24 (*)    GFR calc non Af Amer 53 (*)    All other components within normal limits  CBC WITH DIFFERENTIAL/PLATELET - Abnormal; Notable for the following:    WBC 10.8 (*)    RBC 3.13 (*)    Hemoglobin 9.6 (*)    HCT 28.7 (*)    Platelets 421 (*)    Neutro Abs 7.9 (*)    Monocytes Absolute 1.2 (*)    All other components within normal limits  PROTIME-INR - Abnormal; Notable for the following:    Prothrombin Time 16.9 (*)    All other components within normal limits  POC OCCULT BLOOD, ED - Abnormal; Notable for the following:    Fecal Occult Bld POSITIVE (*)    All other components within normal limits  TYPE AND SCREEN  ABO/RH     Radiology Ct Abdomen Pelvis W Contrast  Result Date: 08/15/2016 CLINICAL DATA:  83 year old male fell 1 week ago. During physical therapy, experience right hip pain. Drop in hemoglobin. Contusion right hip. Subsequent encounter. EXAM: CT ABDOMEN AND PELVIS WITH CONTRAST TECHNIQUE: Multidetector CT imaging of the abdomen and pelvis was performed using the standard protocol following bolus  administration of intravenous contrast. CONTRAST:  ISOVUE-300 IOPAMIDOL (ISOVUE-300) INJECTION 61% COMPARISON:  08/15/2016 plain film exam. 02/23/2011 CT abdomen and pelvis. FINDINGS: Lower chest: Basilar subsegmental atelectasis. Heart slightly enlarged. Mitral and aortic valve calcification. Coronary artery calcification. Hepatobiliary: No worrisome hepatic lesion. No calcified gallstones. Pancreas: No pancreatic mass, inflammation or pancreatic duct dilation. Spleen: No mass or enlargement. Adrenals/Urinary Tract: Chronic right ureteral pelvic junction obstruction with marked hydronephrosis and renal cortical thinning. Three nonobstructing right renal calculi measuring up to 8 mm. No worrisome renal or adrenal mass. Calcification right posterior lateral bladder. Question dependent small stones versus wall calcification. Impression upon the bladder base by enlarged prostate gland. Clinical and laboratory correlation recommended. Stomach/Bowel: Moderate-size hiatal hernia. Colonic diverticulosis. No primary bowel inflammatory process free fluid or free air. Vascular/Lymphatic: Aortic calcification and ectasia without focal aneurysm or large vessel occlusion.  No adenopathy. Reproductive: Enlarged prostate gland impresses upon the bladder base. Clinical and laboratory correlation recommended. Other: No psoas muscle or rectus muscle hematoma. Musculoskeletal: Hematoma within the right gluteus maximus lateral to the right greater trochanter measures approximately 4 x 7 x 11 cm. No underlying right hip fracture detected. Scoliosis lumbar spine convex left with superimposed degenerative changes. IMPRESSION: Hematoma within the right gluteus maximus muscle lateral to the right greater trochanter measures approximately 4 x 7 x 11 cm. No underlying right hip fracture detected. Chronic right ureteral pelvic junction obstruction with marked hydronephrosis and renal cortical thinning. Three nonobstructing right renal  calculi measuring up to 8 mm. Calcification right posterior lateral bladder. Question dependent small stones versus wall calcification. Impression upon the bladder base by enlarged prostate gland. Clinical and laboratory correlation recommended. Moderate-size hiatal hernia. Colonic diverticulosis. Aortic atherosclerosis. Electronically Signed   By: Lacy Duverney M.D.   On: 08/15/2016 15:07   Dg Hip Unilat With Pelvis 2-3 Views Right  Result Date: 08/15/2016 CLINICAL DATA:  Right hip pain. EXAM: DG HIP (WITH OR WITHOUT PELVIS) 2-3V RIGHT COMPARISON:  None. FINDINGS: There is no evidence of hip fracture or dislocation. There is no evidence of arthropathy or other focal bone abnormality. IMPRESSION: Negative. Electronically Signed   By: Kennith Center M.D.   On: 08/15/2016 12:01    Procedures Procedures (including critical care time)  Medications Ordered in ED Medications  HYDROmorphone (DILAUDID) injection 0.5 mg (0.5 mg Intravenous Given 08/15/16 1142)  iopamidol (ISOVUE-300) 61 % injection (not administered)  iopamidol (ISOVUE-300) 61 % injection 100 mL (100 mLs Intravenous Contrast Given 08/15/16 1408)     Initial Impression / Assessment and Plan / ED Course  I have reviewed the triage vital signs and the nursing notes.  Pertinent labs & imaging results that were available during my care of the patient were reviewed by me and considered in my medical decision making (see chart for details).   patient presented to the emergency room with complaints of acute right hip pain. X-rays do not show any evidence of hip fracture.  Patient's laboratory tests do show a significant drop in his hemoglobin. I ordered a CT scan to evaluate for possibility of retroperitoneal hematoma or occult fracture. CT scan shows a rather large hematoma in the right hip and buttock region. I suspect this is the main cause of his anemia however he does have guaic positive stools.  Will consult with the medical service for  admission, serial hemoglobins  Final Clinical Impressions(s) / ED Diagnoses   Final diagnoses:  Hip hematoma, right, initial encounter  Acute blood loss anemia  Occult blood positive stool      Linwood Dibbles, MD 08/15/16 1545

## 2016-08-15 NOTE — ED Triage Notes (Addendum)
Per EMS patient fell 1 week ago but this morning he was on bike at physical therapy and began to experience pain to right hip.  Patient has right sided weakness and facial drop from previous stroke per MD.  Patient from [facility].

## 2016-08-15 NOTE — H&P (Signed)
[facility] H&P   Patient Demographics:    Matthew Greer, is a 83 y.o. male  MRN: [id number]   DOB - April 01, 1926  Admit Date - 08/15/2016  Outpatient Primary MD for the patient is Rolm Gala, MD  Referring MD/NP/PA: Dr Lynelle Doctor  Patient coming from: [facility] subacute rehab.  Chief Complaint  Patient presents with  . Hip Pain      HPI:    Matthew Greer  is a 83 y.o. male, history of CVA,hypertension, hyperlipidemia, with recent admission to [facility] December 2024for acute CVA with residual right-sided weakness, discharged to [facility],patient presents with complaints of right hip pain, patient report. Binder 7-10 days, he report last week he slept from the patient with PT with right lateral hip bruise since then, but did not have any complaints of pain, reports today with physical therapy at facility, he suddenly started experiencing severe pain in the right hip area, with spasms, which prompted him to come to the ED, he denies any worsening weakness, tingling or numbness, NAD significant for anemia with hemoglobin of 9.6 from baseline 14.3, CT abdomen and pelvis significant for right gluteal hematoma 4 x 7 x 11 cm, with no evidence of fracture, physical patient was Hemoccult positive, but no melena or bright red blood per rectum, I was called to admit the patient.    Review of systems:    In addition to the HPI above,  No Fever-chills, No Headache, No changes with Vision or hearing, No problems swallowing food or Liquids, No Chest pain, Cough or Shortness of Breath, No Abdominal pain, No Nausea or Vommitting, Bowel movements are regular, No Blood in stool or Urine, No dysuria, No new skin rashes or bruises, Complains of right hip pain No new weakness, tingling, numbness in any extremity,chronic right-sided weakness No recent weight gain  or loss, No polyuria, polydypsia or polyphagia, No significant Mental Stressors.  A full 10 point Review of Systems was done, except as stated above, all other Review of Systems were negative.   With Past History of the following :    Past Medical History:  Diagnosis Date  . Hypertension   . Stroke [facility]) 2013   "minor" - no deficits      Past Surgical History:  Procedure Laterality Date  . ESOPHAGEAL DILATION  02/24/2016   Procedure: ESOPHAGEAL DILATION;  Surgeon: Midge Minium, MD;  Location: [facility];  Service: Endoscopy;;  . ESOPHAGOGASTRODUODENOSCOPY (EGD) WITH PROPOFOL N/A 02/24/2016   Procedure: ESOPHAGOGASTRODUODENOSCOPY (EGD) WITH PROPOFOL;  Surgeon: Midge Minium, MD;  Location: [facility];  Service: Endoscopy;  Laterality: N/A;  . ESOPHAGOGASTRODUODENOSCOPY ENDOSCOPY  03/2013   Dr. Servando Snare  . THORACOTOMY  1953      Social History:     Social History  Substance Use Topics  . Smoking status: Former Games developer  . Smokeless tobacco: Never Used  . Alcohol use  No     Lives - currently in [facility] since his discharge in February 2017  Mobility - with assistance     Family History :    History reviewed. No pertinent family history.   Home Medications:   Prior to Admission medications   Medication Sig Start Date End Date Taking? Authorizing Provider  acetaminophen (TYLENOL) 325 MG tablet Take 650 mg by mouth every 6 (six) hours as needed.    Historical Provider, MD  amLODipine (NORVASC) 10 MG tablet Take 1 tablet (10 mg total) by mouth daily. 05/28/16   Adrian Saran, MD  aspirin EC 81 MG tablet Take by mouth.    Historical Provider, MD  atorvastatin (LIPITOR) 10 MG tablet Take by mouth. 06/10/15 06/09/16  Historical Provider, MD  clopidogrel (PLAVIX) 75 MG tablet Take 1 tablet (75 mg total) by mouth daily. 05/28/16   Adrian Saran, MD  loratadine (CLARITIN) 5 MG chewable tablet Chew by mouth.    Historical Provider, MD  metoprolol  (LOPRESSOR) 50 MG tablet Take 1 tablet (50 mg total) by mouth 2 (two) times daily. 05/28/16   Adrian Saran, MD     Allergies:     Allergies  Allergen Reactions  . Finasteride Nausea Only    confusion  . Lisinopril Other (See Comments)    Confusion     Physical Exam:   Vitals  Blood pressure 124/67, pulse 70, temperature 98 F (36.7 C), temperature source Oral, resp. rate 17, SpO2 96 %.   1. General well-developed elderly male lying in bed in NAD,    2. Normal affect and insight, Not Suicidal or Homicidal, Awake Alert, Oriented X 3.  3. With chronic right facial droop, right upper extremity contracted, as well as multiple 3-5 with right lower extremity, no significant deficits and left side.  4. Ears and Eyes appear Normal, Conjunctivae clear, PERRLA. Moist Oral Mucosa.  5. Supple Neck, No JVD, No cervical lymphadenopathy appriciated, No Carotid Bruits.  6. Symmetrical Chest wall movement, Good air movement bilaterally, CTAB.  7. RRR, No Gallops, Rubs or Murmurs, No Parasternal Heave.  8. Positive Bowel Sounds, Abdomen Soft, No tenderness, No organomegaly appriciated,No rebound -guarding or rigidity.  9.  No Cyanosis, Normal Skin Turgor, No Skin Rash , Patient with improving/ecchymosis in the right lateral hip site.  10. Good muscle tone,  joints appear normal , no effusions, .  11. No Palpable Lymph Nodes in Neck or Axillae     Data Review:    CBC  Recent Labs Lab 08/15/16 1129  WBC 10.8*  HGB 9.6*  HCT 28.7*  PLT 421*  MCV 91.7  MCH 30.7  MCHC 33.4  RDW 13.5  LYMPHSABS 1.5  MONOABS 1.2*  EOSABS 0.2  BASOSABS 0.0   ------------------------------------------------------------------------------------------------------------------  Chemistries   Recent Labs Lab 08/15/16 1129  NA 138  K 3.7  CL 108  CO2 23  GLUCOSE 93  BUN 24*  CREATININE 1.19  CALCIUM 9.3    ------------------------------------------------------------------------------------------------------------------ CrCl cannot be calculated (Unknown ideal weight.). ------------------------------------------------------------------------------------------------------------------ No results for input(s): TSH, T4TOTAL, T3FREE, THYROIDAB in the last 72 hours.  Invalid input(s): FREET3  Coagulation profile  Recent Labs Lab 08/15/16 1129  INR 1.36   ------------------------------------------------------------------------------------------------------------------- No results for input(s): DDIMER in the last 72 hours. -------------------------------------------------------------------------------------------------------------------  Cardiac Enzymes No results for input(s): CKMB, TROPONINI, MYOGLOBIN in the last 168 hours.  Invalid input(s): CK ------------------------------------------------------------------------------------------------------------------ No results found for: BNP   ---------------------------------------------------------------------------------------------------------------  Urinalysis    Component Value Date/Time  COLORURINE YELLOW (A) 05/25/2016 2337   APPEARANCEUR CLEAR (A) 05/25/2016 2337   APPEARANCEUR Clear 07/12/2011 1236   LABSPEC 1.011 05/25/2016 2337   LABSPEC 1.010 07/12/2011 1236   PHURINE 7.0 05/25/2016 2337   GLUCOSEU NEGATIVE 05/25/2016 2337   GLUCOSEU Negative 07/12/2011 1236   HGBUR SMALL (A) 05/25/2016 2337   BILIRUBINUR NEGATIVE 05/25/2016 2337   BILIRUBINUR Negative 07/12/2011 1236   KETONESUR NEGATIVE 05/25/2016 2337   PROTEINUR NEGATIVE 05/25/2016 2337   NITRITE NEGATIVE 05/25/2016 2337   LEUKOCYTESUR LARGE (A) 05/25/2016 2337   LEUKOCYTESUR Negative 07/12/2011 1236    ----------------------------------------------------------------------------------------------------------------   Imaging Results:    Ct Abdomen Pelvis W  Contrast  Result Date: 08/15/2016 CLINICAL DATA:  83 year old male fell 1 week ago. During physical therapy, experience right hip pain. Drop in hemoglobin. Contusion right hip. Subsequent encounter. EXAM: CT ABDOMEN AND PELVIS WITH CONTRAST TECHNIQUE: Multidetector CT imaging of the abdomen and pelvis was performed using the standard protocol following bolus administration of intravenous contrast. CONTRAST:  ISOVUE-300 IOPAMIDOL (ISOVUE-300) INJECTION 61% COMPARISON:  08/15/2016 plain film exam. 02/23/2011 CT abdomen and pelvis. FINDINGS: Lower chest: Basilar subsegmental atelectasis. Heart slightly enlarged. Mitral and aortic valve calcification. Coronary artery calcification. Hepatobiliary: No worrisome hepatic lesion. No calcified gallstones. Pancreas: No pancreatic mass, inflammation or pancreatic duct dilation. Spleen: No mass or enlargement. Adrenals/Urinary Tract: Chronic right ureteral pelvic junction obstruction with marked hydronephrosis and renal cortical thinning. Three nonobstructing right renal calculi measuring up to 8 mm. No worrisome renal or adrenal mass. Calcification right posterior lateral bladder. Question dependent small stones versus wall calcification. Impression upon the bladder base by enlarged prostate gland. Clinical and laboratory correlation recommended. Stomach/Bowel: Moderate-size hiatal hernia. Colonic diverticulosis. No primary bowel inflammatory process free fluid or free air. Vascular/Lymphatic: Aortic calcification and ectasia without focal aneurysm or large vessel occlusion. No adenopathy. Reproductive: Enlarged prostate gland impresses upon the bladder base. Clinical and laboratory correlation recommended. Other: No psoas muscle or rectus muscle hematoma. Musculoskeletal: Hematoma within the right gluteus maximus lateral to the right greater trochanter measures approximately 4 x 7 x 11 cm. No underlying right hip fracture detected. Scoliosis lumbar spine convex left  with superimposed degenerative changes. IMPRESSION: Hematoma within the right gluteus maximus muscle lateral to the right greater trochanter measures approximately 4 x 7 x 11 cm. No underlying right hip fracture detected. Chronic right ureteral pelvic junction obstruction with marked hydronephrosis and renal cortical thinning. Three nonobstructing right renal calculi measuring up to 8 mm. Calcification right posterior lateral bladder. Question dependent small stones versus wall calcification. Impression upon the bladder base by enlarged prostate gland. Clinical and laboratory correlation recommended. Moderate-size hiatal hernia. Colonic diverticulosis. Aortic atherosclerosis. Electronically Signed   By: Lacy Duverney M.D.   On: 08/15/2016 15:07   Dg Hip Unilat With Pelvis 2-3 Views Right  Result Date: 08/15/2016 CLINICAL DATA:  Right hip pain. EXAM: DG HIP (WITH OR WITHOUT PELVIS) 2-3V RIGHT COMPARISON:  None. FINDINGS: There is no evidence of hip fracture or dislocation. There is no evidence of arthropathy or other focal bone abnormality. IMPRESSION: Negative. Electronically Signed   By: Kennith Center M.D.   On: 08/15/2016 12:01      Assessment & Plan:    Active Problems:   Benign essential hypertension   Pure hypercholesterolemia   Stroke [facility])   CVA (cerebral vascular accident) (HCC)   Acute blood loss anemia   Hip hematoma, right, initial encounter  Acute blood loss anemia - Baseline hemoglobin 14.3, crit 39.6 this  is most likely related to right hip hematoma, vital signs stable, no indication for transfusions, we'll continue to hold aspirin and Plavix currently, we'll recheck H&H in a.m.Marland Kitchen - Patient with Hemoccult positive stool, but with no evidence of significant GI bleed, normal color stool, at endoscopy in September 2017 with no evidence of upper GI bleed, is unlikely contributing factor impression acute blood loss anemia.  Right hip hematoma - Please see above discussion, will hold  aspirin and Plavix today, if hemodynamic stable origin of aspirin in a.m..  Hemoccult positive stool - No evidence of significant GI bleed, recent endoscopy in September 2017, will monitor clinically, lungs no evidence of significant GI bleed this can be followed as an outpatient.  History of CVA - will resume aspirin in a.m. If hemoglobin remains stable, continue with statin.  Hypertension - Blood pressure except tubal, continue with all medication  Hyperlipidemia - Continue with statin   DVT Prophylaxis  SCDs  AM Labs Ordered, also please review Full Orders  Family Communication: Admission, patients condition and plan of care including tests being ordered have been discussed with the patient and Legal partner/spouse Sanderson,[place] who indicate understanding and agree with the plan and Code Status.  Code Status Full  Likely DC to  Back to SNF   Condition GUARDED    Consults called: none  Admission status: Inpatient   Time spent in minutes : 55 minutes   Autry Droege M.D on 08/15/2016 at 5:24 PM  Between 7am to 7pm - Pager - [phone]. After 7pm go to [email or web address] - password [facility]  [facility] - Office  [phone]

## 2016-08-15 NOTE — ED Notes (Signed)
Bed: WA24 Expected date:  Expected time:  Means of arrival:  Comments: EMS-hip pain 

## 2016-08-15 NOTE — ED Notes (Signed)
Patient transported to X-ray 

## 2016-08-16 LAB — BASIC METABOLIC PANEL
ANION GAP: 5 (ref 5–15)
BUN: 23 mg/dL — ABNORMAL HIGH (ref 6–20)
CALCIUM: 8.8 mg/dL — AB (ref 8.9–10.3)
CHLORIDE: 107 mmol/L (ref 101–111)
CO2: 24 mmol/L (ref 22–32)
Creatinine, Ser: 1.15 mg/dL (ref 0.61–1.24)
GFR calc non Af Amer: 55 mL/min — ABNORMAL LOW (ref >60)
GLUCOSE: 93 mg/dL (ref 65–99)
POTASSIUM: 3.9 mmol/L (ref 3.5–5.1)
Sodium: 136 mmol/L (ref 135–145)

## 2016-08-16 LAB — CBC
HEMATOCRIT: 27.2 % — AB (ref 39.0–52.0)
Hemoglobin: 8.9 g/dL — ABNORMAL LOW (ref 13.0–17.0)
MCH: 30.2 pg (ref 26.0–34.0)
MCHC: 32.7 g/dL (ref 30.0–36.0)
MCV: 92.2 fL (ref 78.0–100.0)
PLATELETS: 392 10*3/uL (ref 150–400)
RBC: 2.95 MIL/uL — AB (ref 4.22–5.81)
RDW: 13.6 % (ref 11.5–15.5)
WBC: 7.8 10*3/uL (ref 4.0–10.5)

## 2016-08-16 MED ORDER — GUAIFENESIN-DM 100-10 MG/5ML PO SYRP
5.0000 mL | ORAL_SOLUTION | ORAL | Status: DC | PRN
  Administered 2016-08-16: 5 mL via ORAL
  Filled 2016-08-16: qty 10

## 2016-08-16 NOTE — Progress Notes (Signed)
PROGRESS NOTE    Matthew Greer  ZOX:[id number] DOB: 12/09/1925 DOA: 08/15/2016 PCP: Rolm Gala, MD   Brief Narrative:  Matthew Greer  is a 83 y.o. male, history of CVA, hypertension, hyperlipidemia, with recent admission to [facility] December 2017 for acute CVA with residual right-sided weakness, discharged to [facility]. He  presented with complaints of right hip pain and right hip hematoma/bruise after falling from bed. Patient's hemoglobin was found to be decreased to 8.9 this AM and CT abdomen and pelvis was significant for right gluteal hematoma 4 x 7 x 11 cm, with no evidence of fracture. Patient was also found to be hemeoccult positive as well but no evidence of melena or BRBPR.  Assessment & Plan:   Active Problems:   Benign essential hypertension   Pure hypercholesterolemia   Stroke [facility])   CVA (cerebral vascular accident) (HCC)   Acute blood loss anemia   Hip hematoma, right, initial encounter  Acute blood loss anemia likely from Gluteal hematoma - Baseline hemoglobin 14.3, crit 39.6 this is most likely related to right hip hematoma, vital signs stable, no indication for transfusions -Hb/Hct decreased from 9.6/28.7 -> 8.9/27.2  -Continue to hold aspirin and Plavix currently, we'll recheck H&H in a.m.Marland Kitchen - Patient with Hemoccult positive stool, but with no evidence of significant GI bleed, normal color stool, at endoscopy in September 2017 with no evidence of upper GI bleed, is unlikely contributing factor impression acute blood loss anemia. -Repeat CBC in AM  Right Hip/Gluteal Maximus Hematoma -CT Scan showed Hematoma within the right gluteus maximus muscle lateral to the right greater trochanter measures approximately 4 x 7 x 11 cm. No underlying right hip fracture detected. - Please see above discussion, will hold aspirin and Plavix today, if hemodynamic stable will restart Aspirin in a.m.Marland Kitchen -Pain Control with Acetaminophen  650 mg po q6hprn for Mild Pain, Oxycodone IR 5 mg po q4hprn for Moderate Pain; Hydromorphone Order D/C'd  Hemoccult Positive Stool - No evidence of significant GI bleed, recent endoscopy in September 2017, will monitor clinically as long as there is no evidence of significant GI bleed this can be followed as an outpatient.  History of CVA - Will resume Aspirin in a.m. if hemoglobin remains stable.  - Continue with Atorvastatin 10 mg po Daily.  Hypertension - C/w Home BP medications of Amlodipine 10 mg po Daily and with Metoprolol 50 mg po BID  Hyperlipidemia - Continue with Atorvastatin 10 mg po Daily   Chronic UPJ Obstruction with Right Hydronephrosis -Discussed with Dr. Sherron Monday about case and he stated there were no intervention necessary as he reviewed the films and advised outpatient follow up  DVT prophylaxis: SCDs Code Status: FULL CODE Family Communication: Discussed with Husband/Partner at Bedside Disposition Plan: Will obtain PT Evaluation  Consultants:   Urology Dr. Sherron Monday via telephone consultation    Procedures: None    Antimicrobials:  Anti-infectives    None     Subjective: Seen and examined at bedside and stated leg and hip pain was better. No Nausea or Vomiting. No other concerns or complaints at this time.  Objective: Vitals:   08/15/16 1648 08/15/16 1728 08/15/16 2048 08/16/16 0421  BP: 124/67 133/74 (!) 117/54 120/65  Pulse: 70 71 70 76  Resp: 17 18 18 18   Temp: 98 F (36.7 C) 97.6 F (36.4 C) 98.2 F (36.8 C) 98.1 F (36.7 C)  TempSrc: Oral Oral Oral Oral  SpO2: 96% 98% 98% 97%  Weight:  79  kg (174 lb 2.6 oz)    Height:  5\' 11"  (1.803 m)      Intake/Output Summary (Last 24 hours) at 08/16/16 0803 Last data filed at 08/16/16 0600  Gross per 24 hour  Intake           1287.5 ml  Output              250 ml  Net           1037.5 ml   Filed Weights   08/15/16 1728  Weight: 79 kg (174 lb 2.6 oz)   Examination: Physical  Exam:  Constitutional: WN/WD, NAD and appears calm and comfortable Eyes: Lids and conjunctivae normal, sclerae anicteric  ENMT: External Ears, Nose appear normal. Hard of hearing.  Neck: Appears normal, supple, no cervical masses, normal ROM, no appreciable thyromegaly Respiratory: Clear to auscultation bilaterally, no wheezing, rales, rhonchi or crackles. Normal respiratory effort and patient is not tachypenic. No accessory muscle use.  Cardiovascular: RRR, no murmurs / rubs / gallops. S1 and S2 auscultated. No extremity edema.   Abdomen: Soft, non-tender, non-distended. No masses palpated. No appreciable hepatosplenomegaly. Bowel sounds positive.  GU: Deferred. Musculoskeletal: No clubbing / cyanosis of digits/nails. Right Hand contractures. Skin: Significant bruising on Right hip extending toward buttock area.  Neurologic: CN 2-12 grossly intact with no focal deficits. Right hand contracture with diminished sensation compared to Right side. Romberg sign cerebellar reflexes not assessed.  Psychiatric: Normal judgment and insight. Alert and oriented x 3. Normal mood and appropriate affect.   Data Reviewed: I have personally reviewed following labs and imaging studies  CBC:  Recent Labs Lab 08/15/16 1129 08/16/16 0617  WBC 10.8* 7.8  NEUTROABS 7.9*  --   HGB 9.6* 8.9*  HCT 28.7* 27.2*  MCV 91.7 92.2  PLT 421* 392   Basic Metabolic Panel:  Recent Labs Lab 08/15/16 1129 08/16/16 0617  NA 138 136  K 3.7 3.9  CL 108 107  CO2 23 24  GLUCOSE 93 93  BUN 24* 23*  CREATININE 1.19 1.15  CALCIUM 9.3 8.8*   GFR: Estimated Creatinine Clearance: 47.3 mL/min (by C-G formula based on SCr of 1.15 mg/dL). Liver Function Tests: No results for input(s): AST, ALT, ALKPHOS, BILITOT, PROT, ALBUMIN in the last 168 hours. No results for input(s): LIPASE, AMYLASE in the last 168 hours. No results for input(s): AMMONIA in the last 168 hours. Coagulation Profile:  Recent Labs Lab  08/15/16 1129  INR 1.36   Cardiac Enzymes: No results for input(s): CKTOTAL, CKMB, CKMBINDEX, TROPONINI in the last 168 hours. BNP (last 3 results) No results for input(s): PROBNP in the last 8760 hours. HbA1C: No results for input(s): HGBA1C in the last 72 hours. CBG: No results for input(s): GLUCAP in the last 168 hours. Lipid Profile: No results for input(s): CHOL, HDL, LDLCALC, TRIG, CHOLHDL, LDLDIRECT in the last 72 hours. Thyroid Function Tests: No results for input(s): TSH, T4TOTAL, FREET4, T3FREE, THYROIDAB in the last 72 hours. Anemia Panel: No results for input(s): VITAMINB12, FOLATE, FERRITIN, TIBC, IRON, RETICCTPCT in the last 72 hours. Sepsis Labs: No results for input(s): PROCALCITON, LATICACIDVEN in the last 168 hours.  No results found for this or any previous visit (from the past 240 hour(s)).   Radiology Studies: Ct Abdomen Pelvis W Contrast  Result Date: 08/15/2016 CLINICAL DATA:  83 year old male fell 1 week ago. During physical therapy, experience right hip pain. Drop in hemoglobin. Contusion right hip. Subsequent encounter. EXAM: CT ABDOMEN AND PELVIS WITH  CONTRAST TECHNIQUE: Multidetector CT imaging of the abdomen and pelvis was performed using the standard protocol following bolus administration of intravenous contrast. CONTRAST:  ISOVUE-300 IOPAMIDOL (ISOVUE-300) INJECTION 61% COMPARISON:  08/15/2016 plain film exam. 02/23/2011 CT abdomen and pelvis. FINDINGS: Lower chest: Basilar subsegmental atelectasis. Heart slightly enlarged. Mitral and aortic valve calcification. Coronary artery calcification. Hepatobiliary: No worrisome hepatic lesion. No calcified gallstones. Pancreas: No pancreatic mass, inflammation or pancreatic duct dilation. Spleen: No mass or enlargement. Adrenals/Urinary Tract: Chronic right ureteral pelvic junction obstruction with marked hydronephrosis and renal cortical thinning. Three nonobstructing right renal calculi measuring up to 8 mm.  No worrisome renal or adrenal mass. Calcification right posterior lateral bladder. Question dependent small stones versus wall calcification. Impression upon the bladder base by enlarged prostate gland. Clinical and laboratory correlation recommended. Stomach/Bowel: Moderate-size hiatal hernia. Colonic diverticulosis. No primary bowel inflammatory process free fluid or free air. Vascular/Lymphatic: Aortic calcification and ectasia without focal aneurysm or large vessel occlusion. No adenopathy. Reproductive: Enlarged prostate gland impresses upon the bladder base. Clinical and laboratory correlation recommended. Other: No psoas muscle or rectus muscle hematoma. Musculoskeletal: Hematoma within the right gluteus maximus lateral to the right greater trochanter measures approximately 4 x 7 x 11 cm. No underlying right hip fracture detected. Scoliosis lumbar spine convex left with superimposed degenerative changes. IMPRESSION: Hematoma within the right gluteus maximus muscle lateral to the right greater trochanter measures approximately 4 x 7 x 11 cm. No underlying right hip fracture detected. Chronic right ureteral pelvic junction obstruction with marked hydronephrosis and renal cortical thinning. Three nonobstructing right renal calculi measuring up to 8 mm. Calcification right posterior lateral bladder. Question dependent small stones versus wall calcification. Impression upon the bladder base by enlarged prostate gland. Clinical and laboratory correlation recommended. Moderate-size hiatal hernia. Colonic diverticulosis. Aortic atherosclerosis. Electronically Signed   By: Lacy Duverney M.D.   On: 08/15/2016 15:07   Dg Hip Unilat With Pelvis 2-3 Views Right  Result Date: 08/15/2016 CLINICAL DATA:  Right hip pain. EXAM: DG HIP (WITH OR WITHOUT PELVIS) 2-3V RIGHT COMPARISON:  None. FINDINGS: There is no evidence of hip fracture or dislocation. There is no evidence of arthropathy or other focal bone abnormality.  IMPRESSION: Negative. Electronically Signed   By: Kennith Center M.D.   On: 08/15/2016 12:01   Scheduled Meds: . amLODipine  10 mg Oral Daily  . atorvastatin  10 mg Oral q1800  . loratadine  5 mg Oral Daily  . metoprolol  50 mg Oral BID   Continuous Infusions: . sodium chloride 75 mL/hr at 08/16/16 0645     LOS: 1 day   Merlene Laughter, DO [facility] Pager [phone]  If 7PM-7AM, please contact night-coverage [email or web address] Password [facility] 08/16/2016, 8:03 AM

## 2016-08-16 NOTE — NC FL2 (Signed)
[place] MEDICAID FL2 LEVEL OF CARE SCREENING TOOL     IDENTIFICATION  Patient Name: Matthew Greer Birthdate: [birth year] Sex: male Admission Date (Current Location): 08/15/2016  [facility] and [place] Number:  Chiropodist and Address:  [facility],  [address] [place], [place] [address]      Provider Number: [id number]  Attending Physician Name and Address:  Merlene Laughter, DO  Relative Name and Phone Number:       Current Level of Care: Hospital Recommended Level of Care: Skilled Nursing Facility Prior Approval Number:    Date Approved/Denied:   PASRR Number:    Discharge Plan: SNF    Current Diagnoses: Patient Active Problem List   Diagnosis Date Noted  . Acute blood loss anemia 08/15/2016  . Hip hematoma, right, initial encounter 08/15/2016  . CVA (cerebral vascular accident) (HCC) 05/25/2016  . Problems with swallowing and mastication   . Stricture and stenosis of esophagus   . Benign essential hypertension 01/27/2016  . Kidney stones 01/27/2016  . Other nonspecific abnormal finding of lung field 01/27/2016  . Pure hypercholesterolemia 01/27/2016  . Stroke (HCC) 01/27/2016  . 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 Rotator cuff tendonitis 11/10/2013  . Hearing loss 04/09/2013  . Eczema 07/26/2012  . Abdominal pain 12/27/2011    Orientation RESPIRATION BLADDER Height & Weight     Self, Time, Situation, Place  Normal External catheter, Incontinent Weight: 174 lb 2.6 oz (79 kg) Height:  5\' 11"  (180.3 cm)  BEHAVIORAL SYMPTOMS/MOOD NEUROLOGICAL BOWEL NUTRITION STATUS        Diet (Diet DYS3)  AMBULATORY STATUS COMMUNICATION OF NEEDS Skin   Extensive Assist Verbally Normal                       Personal Care Assistance Level of Assistance  Bathing, Feeding, Dressing Bathing Assistance: Limited assistance Feeding assistance: Independent Dressing Assistance: Limited assistance     Functional Limitations Info   Hearing, Speech, Sight Sight Info: Adequate Hearing Info: Impaired Speech Info: Adequate    SPECIAL CARE FACTORS FREQUENCY                       Contractures Contractures Info: Not present    Additional Factors Info  Code Status, Allergies Code Status Info: Full code  Allergies Info: Allergies:  Finasteride, Lisinopril           Current Medications (08/16/2016):  This is the current hospital active medication list Current Facility-Administered Medications  Medication Dose Route Frequency Provider Last Rate Last Dose  . 0.9 %  sodium chloride infusion   Intravenous Continuous Starleen Arms, MD 75 mL/hr at 08/16/16 0645    . acetaminophen (TYLENOL) tablet 650 mg  650 mg Oral Q6H PRN Starleen Arms, MD       Or  . acetaminophen (TYLENOL) suppository 650 mg  650 mg Rectal Q6H PRN Leana Roe Elgergawy, MD      . amLODipine (NORVASC) tablet 10 mg  10 mg Oral Daily Starleen Arms, MD   10 mg at 08/16/16 1022  . atorvastatin (LIPITOR) tablet 10 mg  10 mg Oral q1800 Dawood S Elgergawy, MD      . cyclobenzaprine (FLEXERIL) tablet 7.5 mg  7.5 mg Oral TID PRN Starleen Arms, MD      . loratadine (CLARITIN) tablet 5 mg  5 mg Oral Daily Starleen Arms, MD   5 mg at 08/16/16  1022  . metoprolol (LOPRESSOR) tablet 50 mg  50 mg Oral BID Starleen Arms, MD   50 mg at 08/16/16 1022  . oxyCODONE (Oxy IR/ROXICODONE) immediate release tablet 5 mg  5 mg Oral Q4H PRN Starleen Arms, MD         Discharge Medications: Please see discharge summary for a list of discharge medications.  Relevant Imaging Results:  Relevant Lab Results:   Additional Information SSN [id number]  Antionette Poles, LCSW

## 2016-08-16 NOTE — Care Management Note (Signed)
Case Management Note  Patient Details  Name: Matthew Greer MRN: [id number] Date of Birth: [birth year]  Subjective/Objective:   Pt admitted with acute blood loss anemia                 Action/Plan: Plan to return to [facility] SNF   Expected Discharge Date:  08/18/2016               Expected Discharge Plan:  Skilled Nursing Facility  In-House Referral:  Clinical Social Work  Discharge planning Services  CM Consult  Post Acute Care Choice:  NA Choice offered to:  NA  DME Arranged:  N/A DME Agency:  NA  HH Arranged:  NA HH Agency:  NA  Status of Service:  In process, will continue to follow  If discussed at Long Length of Stay Meetings, dates discussed:    Additional CommentsGeni Bers, RN 08/16/2016, 11:42 AM

## 2016-08-16 NOTE — Clinical Social Work Note (Signed)
Clinical Social Work Assessment  Patient Details  Name: Matthew Greer MRN: [id number] Date of Birth: 12-Oct-1925  Date of referral:  08/16/16               Reason for consult:  Facility Placement                Permission sought to share information with:  Facility Art therapist granted to share information::  Yes, Verbal Permission Granted  Name::        Agency::     Relationship::     Contact Information:     Housing/Transportation Living arrangements for the past 2 months:  [place] of Information:  Patient, Spouse Patient Interpreter Needed:  None Criminal Activity/Legal Involvement Pertinent to Current Situation/Hospitalization:  No - Comment as needed Significant Relationships:  Spouse Lives with:  Facility Resident Do you feel safe going back to the place where you live?  Yes Need for family participation in patient care:  Yes (Comment) Patient's spouse  Care giving concerns:  Patient currently residing at [facility] for short term rehab after suffering a stroke. Patient reported that he has extreme difficulty walking.    Social Worker assessment / plan:  CSW spoke with patient and patient's spouse at bedside. Patient's spouse reports that patient has been at [facility] since the end of January after having a stroke. Patient was having a hard time hearing CSW and requested that CSW speak directly with his spouse. Patient's spouse reports that patient plans to return to [facility] to continue rehab and the end goal is for patient to return home. Patient requested to be transported via [facility] back to [facility], noting he feels that will be the best mode of transportation. CSW informed patient's spouse that CSW would coordinate transportation back to the facility for the patient. CSW completed Fl2 and will continue to follow patient for discharge planning.   Employment status:  Retired Forensic scientist:  Medicare PT  Recommendations:  Not assessed at this time Information / Referral to community resources:  [place]  Patient/Family's Response to care:  Patient's spouse reported that patient is doing well with therapy and is making progress at [facility]. Patient's spouse reported that patient came to the hospital after experiencing intolerable pain, patient's spouse reports that the pain is being managed and that the patient is feeling better.   Patient/Family's Understanding of and Emotional Response to Diagnosis, Current Treatment, and Prognosis:  Patient and patient's spouse presented calm, appropriate and pleasant when speaking with CSW. Patient's spouse reports that patient appears to be happy at current SNF, noting of course there are always things that could be changed but the patient is progressing. Patient's spouse reported that the goal is to get the patient back home after completing rehab. Patient and patient's spouse hopeful that the goal will be met.   Emotional Assessment Appearance:  Appears younger than stated age Attitude/Demeanor/Rapport:   (Cooperative; Open) Affect (typically observed):  Calm, Appropriate, Pleasant Orientation:  Oriented to Self, Oriented to Place, Oriented to  Time, Oriented to Situation Alcohol / Substance use:  Not Applicable Psych involvement (Current and /or in the community):  No (Comment)  Discharge Needs  Concerns to be addressed:  No discharge needs identified Readmission within the last 30 days:  No Current discharge risk:  None Barriers to Discharge:  No Barriers Identified   Burnis Medin, LCSW 08/16/2016, 2:10 PM

## 2016-08-17 DIAGNOSIS — I82401 Acute embolism and thrombosis of unspecified deep veins of right lower extremity: Secondary | ICD-10-CM

## 2016-08-17 LAB — CBC WITH DIFFERENTIAL/PLATELET
BASOS ABS: 0 10*3/uL (ref 0.0–0.1)
Basophils Relative: 0 %
Eosinophils Absolute: 0.3 10*3/uL (ref 0.0–0.7)
Eosinophils Relative: 5 %
HEMATOCRIT: 26.9 % — AB (ref 39.0–52.0)
Hemoglobin: 8.7 g/dL — ABNORMAL LOW (ref 13.0–17.0)
Lymphocytes Relative: 26 %
Lymphs Abs: 1.8 10*3/uL (ref 0.7–4.0)
MCH: 29.5 pg (ref 26.0–34.0)
MCHC: 32.3 g/dL (ref 30.0–36.0)
MCV: 91.2 fL (ref 78.0–100.0)
Monocytes Absolute: 0.7 10*3/uL (ref 0.1–1.0)
Monocytes Relative: 10 %
Neutro Abs: 4 10*3/uL (ref 1.7–7.7)
Neutrophils Relative %: 59 %
PLATELETS: 379 10*3/uL (ref 150–400)
RBC: 2.95 MIL/uL — AB (ref 4.22–5.81)
RDW: 13.6 % (ref 11.5–15.5)
WBC: 6.9 10*3/uL (ref 4.0–10.5)

## 2016-08-17 LAB — PHOSPHORUS: PHOSPHORUS: 3.5 mg/dL (ref 2.5–4.6)

## 2016-08-17 LAB — COMPREHENSIVE METABOLIC PANEL
ALK PHOS: 66 U/L (ref 38–126)
ALT: 15 U/L — AB (ref 17–63)
AST: 17 U/L (ref 15–41)
Albumin: 2.6 g/dL — ABNORMAL LOW (ref 3.5–5.0)
Anion gap: 5 (ref 5–15)
BILIRUBIN TOTAL: 0.5 mg/dL (ref 0.3–1.2)
BUN: 22 mg/dL — AB (ref 6–20)
CALCIUM: 8.8 mg/dL — AB (ref 8.9–10.3)
CHLORIDE: 109 mmol/L (ref 101–111)
CO2: 24 mmol/L (ref 22–32)
CREATININE: 0.96 mg/dL (ref 0.61–1.24)
GFR calc Af Amer: >60 mL/min (ref >60)
GFR calc non Af Amer: >60 mL/min (ref >60)
Glucose, Bld: 93 mg/dL (ref 65–99)
Potassium: 4.1 mmol/L (ref 3.5–5.1)
Sodium: 138 mmol/L (ref 135–145)
TOTAL PROTEIN: 5.2 g/dL — AB (ref 6.5–8.1)

## 2016-08-17 LAB — MAGNESIUM: MAGNESIUM: 1.8 mg/dL (ref 1.7–2.4)

## 2016-08-17 MED ORDER — APIXABAN 5 MG PO TABS
5.0000 mg | ORAL_TABLET | Freq: Two times a day (BID) | ORAL | Status: DC
  Administered 2016-08-17 – 2016-08-18 (×2): 5 mg via ORAL
  Filled 2016-08-17 (×2): qty 1

## 2016-08-17 MED ORDER — TRAMADOL HCL 50 MG PO TABS: 50.0000 mg | ORAL_TABLET | Freq: Four times a day (QID) | ORAL | 0 refills | Status: DC | PRN

## 2016-08-17 MED ORDER — OXYCODONE HCL 5 MG PO TABS: 5.0000 mg | ORAL_TABLET | ORAL | 0 refills | Status: DC | PRN

## 2016-08-17 MED ORDER — ASPIRIN EC 81 MG PO TBEC
81.0000 mg | DELAYED_RELEASE_TABLET | Freq: Every day | ORAL | Status: DC
  Administered 2016-08-17: 81 mg via ORAL
  Filled 2016-08-17: qty 1

## 2016-08-17 MED ORDER — CYCLOBENZAPRINE HCL 7.5 MG PO TABS: 7.5000 mg | ORAL_TABLET | Freq: Three times a day (TID) | ORAL | 0 refills | Status: DC | PRN

## 2016-08-17 NOTE — Evaluation (Addendum)
Physical Therapy Evaluation-1x Patient Details Name: ARVID MARENGO MRN: [id number] DOB: Oct 02, 1925 Today's Date: 08/17/2016   History of Present Illness  83 yo male admitted with acute blood loss anemia, R glute hematoma. Hx of CVA with R residual weakness.   Clinical Impression  On eval, pt required Mod assist for mobility. Stood from recliner and placed RW in front of pt so he could hold on with L hand. Therapist supported pt on R side. Worked on static standing, lateral weight-shifting, and stepping (forwards/backwards) with R LE. Pt reported minimal pain with movement of R LE but some increased pain with WBing on R LE. Recommend return to SNF for pt to continue rehab.    Follow Up Recommendations SNF    Equipment Recommendations  None recommended by PT    Recommendations for Other Services OT consult     Precautions / Restrictions Precautions Precautions: Fall Restrictions Weight Bearing Restrictions: No Other Position/Activity Restrictions: Pt w/ right sided weakness since CVA 12/17. He reports that he has mainly been performing SPT.      Mobility  Bed Mobility Overal bed mobility: Needs Assistance           General bed mobility comments: oob in recliner  Transfers Overall transfer level: Needs assistance Equipment used: Rolling walker (2 wheeled) Transfers: Sit to/from Stand Sit to Stand: Mod assist        General transfer comment: Sit to stand with Mod A. Worked on lateral weightshifting and stepping forwards and backwards with R LE. Pt held on to RW with L hand while therapist supported him on R side.   Ambulation/Gait                Stairs            Wheelchair Mobility    Modified Rankin (Stroke Patients Only)       Balance Overall balance assessment: History of Falls;Needs assistance Sitting-balance support: No upper extremity supported;Feet supported Sitting balance-Leahy Scale: Good       Standing balance-Leahy Scale: Poor                                Pertinent Vitals/Pain Pain Assessment: Faces Pain Score: 0-No pain Faces Pain Scale: Hurts little more Pain Location: R hip/LE Pain Descriptors / Indicators: Sore Pain Intervention(s): Limited activity within patient's tolerance;Repositioned    Home Living Family/patient expects to be discharged to:: Skilled nursing facility                 Additional Comments: Pt arrived from [facility] SN[facility] Rehab and plans to return when medically able.    Prior Function Level of Independence: Needs assistance   Gait / Transfers Assistance Needed: nonambulatory since CVA. Working with PT.            Hand Dominance   Dominant Hand: Right    Extremity/Trunk Assessment   Upper Extremity Assessment Upper Extremity Assessment: Defer to OT evaluation RUE Deficits / Details: Hemiplegic R UE w/ increased tone since CVA 05/2016. No A/ROM. P/ROM for shoulder flexion, ABD is significantly limited secondary to tone. RUE Sensation: decreased light touch RUE Coordination: decreased fine motor;decreased gross motor (No A/ROM; decreased sensibility)    Lower Extremity Assessment Lower Extremity Assessment: RLE deficits/detail RLE Deficits / Details: R hip flex 2/5, knee ext 2/5. Increased tone.  RLE Sensation: decreased proprioception RLE Coordination: decreased fine motor;decreased gross motor    Cervical / Trunk Assessment  Cervical / Trunk Assessment: Normal  Communication   Communication: HOH  Cognition Arousal/Alertness: Awake/alert Behavior During Therapy: WFL for tasks assessed/performed Overall Cognitive Status: Within Functional Limits for tasks assessed                 General Comments: Very HOH    General Comments      Exercises     Assessment/Plan    PT Assessment All further PT needs can be met in the next venue of care (return to SNF)  PT Problem List Decreased strength;Decreased mobility;Decreased activity  tolerance;Decreased balance;Decreased knowledge of use of DME;Pain;Decreased coordination;Impaired tone       PT Treatment Interventions      PT Goals (Current goals can be found in the Care Plan section)  Acute Rehab PT Goals Patient Stated Goal: Return to [facility] for SNF Rehab  PT Goal Formulation: All assessment and education complete, DC therapy    Frequency     Barriers to discharge        Co-evaluation               End of Session Equipment Utilized During Treatment: Gait belt Activity Tolerance: Patient tolerated treatment well Patient left: in chair;with call bell/phone within reach;with family/visitor present;with chair alarm set   PT Visit Diagnosis: Other abnormalities of gait and mobility (R26.89)         Time: 7711-6579 PT Time Calculation (min) (ACUTE ONLY): 13 min   Charges:   PT Evaluation $PT Eval Moderate Complexity: 1 Procedure     PT G Codes:         Weston Anna, MPT Pager: [phone]

## 2016-08-17 NOTE — Progress Notes (Signed)
Taking over care of patient, agree with previous RN assessment. Denies any needs at this time. Will continue to monitor.  

## 2016-08-17 NOTE — Progress Notes (Signed)
PROGRESS NOTE    Delphin Funes [facility]  WUJ:[id number] DOB: Aug 14, 1925 DOA: 08/15/2016 PCP: Rolm Gala, MD   Brief Narrative:  Matthew Greer  is a 83 y.o. male, history of CVA, hypertension, hyperlipidemia, with recent admission to [facility] December 2017 for acute CVA with residual right-sided weakness, discharged to [facility]. He  presented with complaints of right hip pain and right hip hematoma/bruise after falling from bed. Patient's hemoglobin was found to be decreased to 8.9 this AM and CT abdomen and pelvis was significant for right gluteal hematoma 4 x 7 x 11 cm, with no evidence of fracture. Patient was also found to be hemeoccult positive as well but no evidence of melena or BRBPR.  Assessment & Plan:   Active Problems:   Benign essential hypertension   Pure hypercholesterolemia   Stroke [facility])   CVA (cerebral vascular accident) (HCC)   Acute blood loss anemia   Hip hematoma, right, initial encounter  Acute blood loss anemia likely from Gluteal hematoma - Baseline hemoglobin 14.3, crit 39.6 this is most likely related to right hip hematoma, vital signs stable, no indication for transfusions -Hb/Hct decreased from 9.6/28.7 -> 8.9/27.2 -> 8.7/26.9  -Will Disconinue aspirin and Plavix currently, we'll recheck H&H in a.m. - After Review of Prior Records patient had an acute DVT in January and will place patient on Home Eliquis for treatment  -Will monitor for S/Sx of Bleeding  - Patient with Hemoccult positive stool, but with no evidence of significant GI bleed, normal color stool, at endoscopy in September 2017 with no evidence of upper GI bleed, is unlikely contributing factor impression acute blood loss anemia. Discussed with [facility] and no intervention at this time.  -Repeat CBC in AM  Right Hip/Gluteal Maximus Hematoma -CT Scan showed Hematoma within the right gluteus maximus muscle lateral to the right greater trochanter measures  approximately 4 x 7 x 11 cm. No underlying right hip fracture detected. - Please see above discussion, will discontinue aspirin and Plavix  -Pain Control with Acetaminophen 650 mg po q6hprn for Mild Pain, Oxycodone IR 5 mg po q4hprn for Moderate Pain; Hydromorphone Order D/C'd  Hemoccult Positive Stool - No evidence of significant GI bleed, recent endoscopy in September 2017, will monitor clinically as long as there is no evidence of significant GI bleed this can be followed as an outpatient. -Discussed with [facility] and she talked with Dr. Leone Payor and recommends no GI intervention at this point -Monitor for S/Sx of Bleeding  Hx of DVT -Upon reviewing prior records from [facility] patient was admitted 06/07/16 and found to have an acute DVT and placed on Eliquis. Patient is on Eliquis 5 mg BID  -Will resume Eliquis 5 mg BID and discontinue Plavix and ASA after discussion with Neurologist Dr. Roda Shutters  History of CVA - Restarted ASA today but will now Discontinue completely along with Plavix as patient's MRA of Head and Vascular Carotid US did not show hemodynamically significant stenosis and after phone discussion with Neurologist Dr. Roda Shutters - Continue with Atorvastatin 10 mg po Daily.  Hypertension - C/w Home BP medications of Amlodipine 10 mg po Daily and with Metoprolol 50 mg po BID  Hyperlipidemia - Continue with Atorvastatin 10 mg po Daily   Chronic UPJ Obstruction with Right Hydronephrosis -Discussed with Dr. Sherron Monday about case and he stated there were no intervention necessary as he reviewed the films and advised outpatient follow up  DVT prophylaxis: SCDs; Restarted Eliquis Code  Status: FULL CODE Family Communication: Discussed with Husband/Partner at Bedside Disposition Plan: SNF if Hb/Hct Remain Stable  Consultants:   Urology Dr. Sherron Monday via telephone consultation  Gastroenterology via telephone conversation  Neurology Dr. Roda Shutters via  Telephone conversation     Procedures: None    Antimicrobials:  Anti-infectives    None     Subjective: Seen and examined at bedside and stated leg was hurting slightly. After reviewing records from [facility] patient had an acute DVT in January and was placed on Eliquis. Will continue Eliquis and discontinue Asprin and Plavix given recent hematoma and drop in H/H. Will also discontinue IVF Fluids. Patient denied any SOB or Nausea. No other concerns or complaints.   Objective: Vitals:   08/16/16 1420 08/16/16 1947 08/17/16 0530 08/17/16 1519  BP: 130/64 (!) 124/56 122/67 (!) 121/52  Pulse: 70 70 76 62  Resp: 19 18 18 18   Temp: 97.8 F (36.6 C) 97.7 F (36.5 C) 97 F (36.1 C) 97.6 F (36.4 C)  TempSrc: Oral Oral Oral Oral  SpO2: 97% 98% 97% 98%  Weight:      Height:        Intake/Output Summary (Last 24 hours) at 08/17/16 1821 Last data filed at 08/17/16 1800  Gross per 24 hour  Intake             2520 ml  Output             1800 ml  Net              720 ml   Filed Weights   08/15/16 1728  Weight: 79 kg (174 lb 2.6 oz)   Examination: Physical Exam:  Constitutional: WN/WD, NAD and appears calm and comfortable sitting bedside  Eyes: Lids and conjunctivae normal, sclerae anicteric  ENMT: External Ears, Nose appear normal. Hard of hearing.  Neck: Appears normal, supple, no cervical masses, normal ROM, no appreciable thyromegaly Respiratory: Clear to auscultation bilaterally, no wheezing, rales, rhonchi or crackles. Normal respiratory effort and patient is not tachypenic. No accessory muscle use.  Cardiovascular: RRR, no murmurs / rubs / gallops. S1 and S2 auscultated. No extremity edema.   Abdomen: Soft, non-tender, non-distended. No masses palpated. No appreciable hepatosplenomegaly. Bowel sounds positive.  GU: Deferred. Musculoskeletal: No clubbing / cyanosis of digits/nails. Right Hand contractures. Skin: Significant bruising on Right hip and  buttock  Neurologic: CN 2-12 grossly intact with no focal deficits. Right hand contracture with diminished sensation compared to Right side. Romberg sign cerebellar reflexes not assessed.  Psychiatric: Normal judgment and insight. Alert and oriented x 3. Normal mood and appropriate affect.   Data Reviewed: I have personally reviewed following labs and imaging studies  CBC:  Recent Labs Lab 08/15/16 1129 08/16/16 0617 08/17/16 0541  WBC 10.8* 7.8 6.9  NEUTROABS 7.9*  --  4.0  HGB 9.6* 8.9* 8.7*  HCT 28.7* 27.2* 26.9*  MCV 91.7 92.2 91.2  PLT 421* 392 379   Basic Metabolic Panel:  Recent Labs Lab 08/15/16 1129 08/16/16 0617 08/17/16 0541  NA 138 136 138  K 3.7 3.9 4.1  CL 108 107 109  CO2 23 24 24   GLUCOSE 93 93 93  BUN 24* 23* 22*  CREATININE 1.19 1.15 0.96  CALCIUM 9.3 8.8* 8.8*  MG  --   --  1.8  PHOS  --   --  3.5   GFR: Estimated Creatinine Clearance: 56.6 mL/min (by C-G formula based on SCr of 0.96 mg/dL). Liver Function  Tests:  Recent Labs Lab 08/17/16 0541  AST 17  ALT 15*  ALKPHOS 66  BILITOT 0.5  PROT 5.2*  ALBUMIN 2.6*   No results for input(s): LIPASE, AMYLASE in the last 168 hours. No results for input(s): AMMONIA in the last 168 hours. Coagulation Profile:  Recent Labs Lab 08/15/16 1129  INR 1.36   Cardiac Enzymes: No results for input(s): CKTOTAL, CKMB, CKMBINDEX, TROPONINI in the last 168 hours. BNP (last 3 results) No results for input(s): PROBNP in the last 8760 hours. HbA1C: No results for input(s): HGBA1C in the last 72 hours. CBG: No results for input(s): GLUCAP in the last 168 hours. Lipid Profile: No results for input(s): CHOL, HDL, LDLCALC, TRIG, CHOLHDL, LDLDIRECT in the last 72 hours. Thyroid Function Tests: No results for input(s): TSH, T4TOTAL, FREET4, T3FREE, THYROIDAB in the last 72 hours. Anemia Panel: No results for input(s): VITAMINB12, FOLATE, FERRITIN, TIBC, IRON, RETICCTPCT in the last 72 hours. Sepsis  Labs: No results for input(s): PROCALCITON, LATICACIDVEN in the last 168 hours.  No results found for this or any previous visit (from the past 240 hour(s)).   Radiology Studies: No results found. Scheduled Meds: . amLODipine  10 mg Oral Daily  . apixaban  5 mg Oral BID  . atorvastatin  10 mg Oral q1800  . loratadine  5 mg Oral Daily  . metoprolol  50 mg Oral BID   Continuous Infusions:   LOS: 2 days   Merlene Laughter, DO [facility] Pager [phone]  If 7PM-7AM, please contact night-coverage [email or web address] Password [facility] 08/17/2016, 6:21 PM

## 2016-08-17 NOTE — Evaluation (Signed)
Occupational Therapy Evaluation Patient Details Name: Matthew Greer MRN: [id number] DOB: 1926/05/23 Today's Date: 08/17/2016    History of Present Illness Pt is an 83 y/o male admitted on 08/15/16 with dx: Acute blood loss anemia. He was admitted from a SNF where he had been having Rehab since a CVA w/ Right sided weakness since December 2017. He plans to return to the same SNF Rehab folowing this acute admission. Please see EPIC for complete PMH.   Clinical Impression   Pt admitted as above currently requiring max-total A for LB ADL's and Min-Mod A UB ADL's. SPT with Min-Mod A. He plans to return to SNF Rehab with ultimate goal to return home. He should benefit from acute OT to assist with 1 handed dressing techniques, pt education, ADL retraining and functional transfers in preparation for d/c back to SNF when medically able.     Follow Up Recommendations  SNF;Supervision/Assistance - 24 hour    Equipment Recommendations  Other (comment) (Defer to next venue)    Recommendations for Other Services       Precautions / Restrictions Precautions Precautions: Fall Restrictions Weight Bearing Restrictions: No Other Position/Activity Restrictions: Pt w/ right sided weakness since CVA 12/17. He reports that he has mainly been performing SPT.      Mobility Bed Mobility Overal bed mobility: Needs Assistance Bed Mobility: Supine to Sit     Supine to sit: Min assist;HOB elevated     General bed mobility comments: Min A for trunk and LE's  Transfers Overall transfer level: Needs assistance Equipment used: None Transfers: Sit to/from [employer] Sit to Stand: Mod assist Stand pivot transfers: Min assist       General transfer comment: Pt reports that he has not ambulated since CVA in Dec - "I can't put any weight through my leg" RLE    Balance Overall balance assessment: History of Falls;Needs assistance Sitting-balance support: No upper extremity supported;Feet  supported Sitting balance-Leahy Scale: Good                                      ADL Overall ADL's : Needs assistance/impaired     Grooming: Wash/dry hands;Wash/dry face;Oral care;Set up;Moderate assistance;Sitting   Upper Body Bathing: Set up;Minimal assistance;Sitting;Moderate assistance Upper Body Bathing Details (indicate cue type and reason): Min A to wash back and wash under R arm & Mod A to wash under L arm Lower Body Bathing: Moderate assistance;Sit to/from stand   Upper Body Dressing : Set up;Sitting;Minimal assistance   Lower Body Dressing: Sit to/from stand;Total assistance   Toilet Transfer:  (NT Pt with catheter) Toilet Transfer Details (indicate cue type and reason): Anticipate that pt would be Mod A SPT to drop arm 3:1 Toileting- Clothing Manipulation and Hygiene:  (NT Anticiapte total assist sitting/lateral leans)       Functional mobility during ADLs:  (Supine to sit EOB Min A for trunk and LE's. Min A Sit to stand for SPT from EOB to recliner) General ADL Comments: Pt seen for acute OT assessment followed by ADL retraining session with focus on UB grooming, bathing and pt education re: 1 handed ADL's.     Vision Baseline Vision/History: Wears glasses (Pt reports that he doesn't have his glasses here at the hospital) Wears Glasses: At all times Patient Visual Report: No change from baseline       Perception     Praxis  Pertinent Vitals/Pain Pain Assessment: Faces Pain Score: 0-No pain Faces Pain Scale: No hurt     Hand Dominance Right   Extremity/Trunk Assessment Upper Extremity Assessment Upper Extremity Assessment: Generalized weakness;RUE deficits/detail RUE Deficits / Details: Hemiplegic R UE w/ increased tone since CVA 05/2016. No A/ROM. P/ROM for shoulder flexion, ABD is significantly limited secondary to tone. RUE Sensation: decreased light touch RUE Coordination: decreased fine motor;decreased gross motor (No A/ROM;  decreased sensibility)   Lower Extremity Assessment Lower Extremity Assessment: Defer to PT evaluation       Communication Communication Communication: HOH (Very HOH)   Cognition Arousal/Alertness: Awake/alert Behavior During Therapy: WFL for tasks assessed/performed Overall Cognitive Status: Within Functional Limits for tasks assessed                 General Comments: Very HOH   General Comments       Exercises       Shoulder Instructions      Home Living Family/patient expects to be discharged to:: Skilled nursing facility                                 Additional Comments: Pt arrived from [facility] SN[facility] Rehab and plans to return when medically able.      Prior Functioning/Environment                   OT Problem List: Decreased strength;Impaired balance (sitting and/or standing);Impaired tone;Pain;Decreased range of motion;Decreased activity tolerance;Decreased coordination;Decreased knowledge of use of DME or AE;Impaired sensation;Impaired UE functional use      OT Treatment/Interventions: Self-care/ADL training;DME and/or AE instruction;Therapeutic activities;Therapeutic exercise;Neuromuscular education;Patient/family education    OT Goals(Current goals can be found in the care plan section) Acute Rehab OT Goals Patient Stated Goal: Return to [facility] for SNF Rehab  Time For Goal Achievement: 08/31/16 Potential to Achieve Goals: Good  OT Frequency: Min 2X/week   Barriers to D/C:            Co-evaluation              End of Session Equipment Utilized During Treatment: Gait belt Nurse Communication: Mobility status;Precautions;Other (comment) (ADL/grooming completed)  Activity Tolerance: Patient tolerated treatment well;No increased pain Patient left: in chair;with call bell/phone within reach;with chair alarm set;with nursing/sitter in room  OT Visit Diagnosis: Unsteadiness on feet (R26.81);History of falling  (Z91.81);Other abnormalities of gait and mobility (R26.89);Muscle weakness (generalized) (M62.81);Other symptoms and signs involving the nervous system (R29.898);Hemiplegia and hemiparesis;Pain Hemiplegia - Right/Left: Right Hemiplegia - dominant/non-dominant: Dominant Hemiplegia - caused by: Cerebral infarction (Dec 2017) Pain - Right/Left: Right Pain - part of body: Hip                ADL either performed or assessed with clinical judgement  Time: 0913-0957 OT Time Calculation (min): 44 min Charges:  OT General Charges $OT Visit: 1 Procedure OT Evaluation $OT Eval Moderate Complexity: 1 Procedure OT Treatments $Self Care/Home Management : 23-37 mins G-Codes: OT G-codes **NOT FOR INPATIENT CLASS** Functional Assessment Tool Used: AM-PAC 6 Clicks Daily Activity;Clinical judgement Functional Limitation: Self care Self Care Current Status (X6553): At least 60 percent but less than 80 percent impaired, limited or restricted Self Care Goal Status (Z4827): At least 20 percent but less than 40 percent impaired, limited or restricted    Alm Bustard, OTR/L 08/17/2016, 10:28 AM

## 2016-08-18 LAB — CBC WITH DIFFERENTIAL/PLATELET
Basophils Absolute: 0 K/uL (ref 0.0–0.1)
Basophils Relative: 0 %
Eosinophils Absolute: 0.4 K/uL (ref 0.0–0.7)
Eosinophils Relative: 6 %
HCT: 26.7 % — ABNORMAL LOW (ref 39.0–52.0)
Hemoglobin: 8.8 g/dL — ABNORMAL LOW (ref 13.0–17.0)
Lymphocytes Relative: 26 %
Lymphs Abs: 1.9 K/uL (ref 0.7–4.0)
MCH: 30.4 pg (ref 26.0–34.0)
MCHC: 33 g/dL (ref 30.0–36.0)
MCV: 92.4 fL (ref 78.0–100.0)
Monocytes Absolute: 0.7 K/uL (ref 0.1–1.0)
Monocytes Relative: 10 %
Neutro Abs: 4.2 K/uL (ref 1.7–7.7)
Neutrophils Relative %: 58 %
Platelets: 385 K/uL (ref 150–400)
RBC: 2.89 MIL/uL — ABNORMAL LOW (ref 4.22–5.81)
RDW: 13.7 % (ref 11.5–15.5)
WBC: 7.2 K/uL (ref 4.0–10.5)

## 2016-08-18 LAB — COMPREHENSIVE METABOLIC PANEL WITH GFR
ALT: 14 U/L — ABNORMAL LOW (ref 17–63)
AST: 15 U/L (ref 15–41)
Albumin: 2.5 g/dL — ABNORMAL LOW (ref 3.5–5.0)
Alkaline Phosphatase: 63 U/L (ref 38–126)
Anion gap: 5 (ref 5–15)
BUN: 21 mg/dL — ABNORMAL HIGH (ref 6–20)
CO2: 25 mmol/L (ref 22–32)
Calcium: 8.7 mg/dL — ABNORMAL LOW (ref 8.9–10.3)
Chloride: 107 mmol/L (ref 101–111)
Creatinine, Ser: 1.05 mg/dL (ref 0.61–1.24)
GFR calc Af Amer: >60 mL/min (ref >60)
GFR calc non Af Amer: >60 mL/min (ref >60)
Glucose, Bld: 91 mg/dL (ref 65–99)
Potassium: 3.5 mmol/L (ref 3.5–5.1)
Sodium: 137 mmol/L (ref 135–145)
Total Bilirubin: 0.6 mg/dL (ref 0.3–1.2)
Total Protein: 5 g/dL — ABNORMAL LOW (ref 6.5–8.1)

## 2016-08-18 LAB — MAGNESIUM: Magnesium: 1.7 mg/dL (ref 1.7–2.4)

## 2016-08-18 LAB — PHOSPHORUS: Phosphorus: 3.7 mg/dL (ref 2.5–4.6)

## 2016-08-18 MED ORDER — SENNOSIDES-DOCUSATE SODIUM 8.6-50 MG PO TABS
1.0000 | ORAL_TABLET | Freq: Two times a day (BID) | ORAL | Status: DC
  Administered 2016-08-18: 1 via ORAL
  Filled 2016-08-18: qty 1

## 2016-08-18 MED ORDER — BISACODYL 10 MG RE SUPP
10.0000 mg | Freq: Once | RECTAL | Status: AC
  Administered 2016-08-18: 10 mg via RECTAL
  Filled 2016-08-18: qty 1

## 2016-08-18 MED ORDER — POLYETHYLENE GLYCOL 3350 17 G PO PACK
17.0000 g | PACK | Freq: Every day | ORAL | Status: DC
  Administered 2016-08-18: 17 g via ORAL
  Filled 2016-08-18: qty 1

## 2016-08-18 MED ORDER — POLYETHYLENE GLYCOL 3350 17 G PO PACK: 17.0000 g | PACK | Freq: Every day | ORAL | 0 refills | Status: DC

## 2016-08-18 MED ORDER — SENNOSIDES-DOCUSATE SODIUM 8.6-50 MG PO TABS: 1.0000 | ORAL_TABLET | Freq: Two times a day (BID) | ORAL | 0 refills | Status: DC

## 2016-08-18 NOTE — Progress Notes (Signed)
Report called to [facility]. awaiting transport, will continue to monitor.

## 2016-08-18 NOTE — Discharge Summary (Addendum)
Physician Discharge Summary  Matthew Greer [facility] RUE:[id number] DOB: Jan 27, 1926 DOA: 08/15/2016  PCP: Rolm Gala, MD  Admit date: 08/15/2016 Discharge date: 08/18/2016  Admitted From: SNF Disposition:  SNF  Recommendations for Outpatient Follow-up:  1. Follow up with PCP in 1-2 weeks 2. Follow up with Gastroenterology as an outpatient for FOBT Positive Stool 3. Follow up with [facility] as an outpatient at D/C in 1-2 weeks 4. Follow up with [facility] as an outpatient to evaluate chronic UPJ obstruction and Right Hydronephrosis 5. Please obtain repeat CBC in AM and BMP/CBC in one week  Home Health: No Equipment/Devices: None  Discharge Condition: Stable CODE STATUS: FULL CODE Diet recommendation: Dysphagia 3 Heart Healthy Diet and Thin Liquids  Brief/Interim Summary: [name] a 83 y.o.male,history of CVA, hypertension, hyperlipidemia, with recent admission to [facility] December 2017 for acute CVA with residual right-sided weakness, discharged to [facility]. He  presented with complaints of right hip pain and right hip hematoma/bruise after falling from bed. Patient's hemoglobin was found to be decreased to 8.9 this AM and CT abdomen and pelvis was significant for right gluteal hematoma 4 x 7 x 11 cm, with no evidence of fracture. Patient was also found to be hemeoccult positive as well but no evidence of melena or BRBPR. Admitted and monitored for S/Sx of bleeding. After review of prior records patient has had a recent DVT and was placed on Eliquis by [facility] ([facility]). His ASA and Plavix were stopped after telephone conversation with Neurologist Dr. Roda Shutters. Patient is deemed medically stable for D/C as his Hb/Hct have remained stable and will need to have repeated in AM and follow up care at [facility].    Discharge Diagnoses:  Active Problems:   Benign essential hypertension   Pure hypercholesterolemia  Stroke [facility])   CVA (cerebral vascular accident) (HCC)   Acute blood loss anemia   Hip hematoma, right, initial encounter  Acute blood loss anemia likely from Gluteal hematoma, stab;e - Baseline hemoglobin 14.3, crit 39.6 this is most likely related to right hip/buttock hematoma, vital signs stable, no indication for transfusions -Hb/Hct decreased from 9.6/28.7 -> 8.9/27.2 -> 8.7/26.9  -> 8.8/26.7 -Will Disconinue aspirin and Plavix, needs repeat CBC in AM - **After Review of Prior Records patient had an acute DVT in January and restart patient on Home Eliquis for treatment  -Will monitor for S/Sx of Bleeding  - Patient with Hemoccult positive stool, but with no evidence of significant GI bleed, normal color stool, at endoscopy in September 2017 with no evidence of upper GI bleed, is unlikely contributing factor impression acute blood loss anemia. Discussed with [facility] and no intervention at this time.  -Repeat CBC in AM at SNF  Right Hip/Gluteal Maximus Hematoma -CT Scan showed Hematoma within the right gluteus maximus muscle lateral to the right greater trochanter measures approximately 4 x 7 x 11 cm. No underlying right hip fracture detected. -Please see above discussion, will discontinue aspirin and Plavix  -Pain Control with Acetaminophen 650 mg po q6hprn for Mild Pain, Oxycodone IR 5 mg po q4hprn for Moderate Pain;  -C/w Cyclobenzaprine 7.5 mg po TID for muscle spasm -Hydromorphone Order D/C'd  Hemoccult Positive Stool - No evidence of significant GI bleed, recent endoscopy in September 2017, will monitor clinically as long as there is no evidence of significant GI bleed this can be followed as an outpatient. -Discussed with [facility] and she talked with Dr. Leone Payor and recommends no GI  intervention at this point; Follow up with Gastroenterology as an outpatient  -Monitor for S/Sx of Bleeding -Repeat CBC in AM  Hx of DVT -Upon reviewing prior records from  [facility] patient was admitted 06/07/16 and found to have an acute DVT and placed on Eliquis. Patient is on Eliquis 5 mg BID  -Will resume Eliquis 5 mg BID and discontinue Plavix and ASA after discussion with Neurologist Dr. Roda Shutters  History of CVA - D/C'd ASA and Plavix as patient's MRA of Head and Vascular Carotid US did not show hemodynamically significant stenosis and after phone discussion with Neurologist Dr. Roda Shutters - Continue with Atorvastatin 10 mg po Daily. - Follow up with Neurology [facility] in 1-2 weeks after D/C  Hypertension - C/w Home BP medications of Amlodipine 10 mg po Daily and with Metoprolol 50 mg po BID  Hyperlipidemia - Continue with Atorvastatin 10 mg po Daily   Chronic UPJ Obstruction with Right Hydronephrosis -Discussed with Dr. Sherron Monday about case and he stated there were no intervention necessary as he reviewed the films and advised outpatient follow up  Discharge Instructions  Discharge Instructions    Call MD for:  difficulty breathing, headache or visual disturbances    Complete by:  As directed    Call MD for:  difficulty breathing, headache or visual disturbances    Complete by:  As directed    Call MD for:  extreme fatigue    Complete by:  As directed    Call MD for:  extreme fatigue    Complete by:  As directed    Call MD for:  persistant dizziness or light-headedness    Complete by:  As directed    Call MD for:  persistant dizziness or light-headedness    Complete by:  As directed    Call MD for:  persistant nausea and vomiting    Complete by:  As directed    Call MD for:  persistant nausea and vomiting    Complete by:  As directed    Call MD for:  redness, tenderness, or signs of infection (pain, swelling, redness, odor or green/yellow discharge around incision site)    Complete by:  As directed    Call MD for:  severe uncontrolled pain    Complete by:  As directed    Call MD for:  severe uncontrolled pain     Complete by:  As directed    Call MD for:  temperature >100.4    Complete by:  As directed    Call MD for:  temperature >100.4    Complete by:  As directed    Diet - low sodium heart healthy    Complete by:  As directed    DYSPHAGIA 3 DIET   Diet - low sodium heart healthy    Complete by:  As directed    Discharge instructions    Complete by:  As directed    Follow Up Care at SNF. Take all medications prescribed. If symptoms change or worsen please return to the ED for evalution.   Discharge instructions    Complete by:  As directed    Follow up Care at SNF; Repeat CBC in AM. Take all medications as prescribed. If symptoms change or worsen please return to the ED for evaluation.   Increase activity slowly    Complete by:  As directed    Increase activity slowly    Complete by:  As directed      Allergies as  of 08/18/2016      Reactions   Finasteride Nausea Only, Other (See Comments)   Reaction:  Confusion    Lisinopril Other (See Comments)   Reaction:  Confusion      Medication List    STOP taking these medications   aspirin EC 81 MG tablet   clopidogrel 75 MG tablet Commonly known as:  PLAVIX   loratadine 10 MG tablet Commonly known as:  CLARITIN     TAKE these medications   acetaminophen 325 MG tablet Commonly known as:  TYLENOL Take 325 mg by mouth every 6 (six) hours as needed for mild pain, moderate pain, fever or headache.   amLODipine 10 MG tablet Commonly known as:  NORVASC Take 1 tablet (10 mg total) by mouth daily.   atorvastatin 10 MG tablet Commonly known as:  LIPITOR Take 10 mg by mouth daily at 6 PM.   cyclobenzaprine 7.5 MG tablet Commonly known as:  FEXMID Take 1 tablet (7.5 mg total) by mouth 3 (three) times daily as needed for muscle spasms.   ELIQUIS 5 MG Tabs tablet Generic drug:  apixaban Take 5 mg by mouth 2 (two) times daily.   metoprolol 50 MG tablet Commonly known as:  LOPRESSOR Take 1 tablet (50 mg total) by mouth 2 (two)  times daily.   oxyCODONE 5 MG immediate release tablet Commonly known as:  Oxy IR/ROXICODONE Take 1 tablet (5 mg total) by mouth every 4 (four) hours as needed for moderate pain.   polyethylene glycol packet Commonly known as:  MIRALAX / GLYCOLAX Take 17 g by mouth daily.   ranitidine 150 MG tablet Commonly known as:  ZANTAC Take 150 mg by mouth daily.   senna-docusate 8.6-50 MG tablet Commonly known as:  Senokot-S Take 1 tablet by mouth 2 (two) times daily.   traMADol 50 MG tablet Commonly known as:  ULTRAM Take 1 tablet (50 mg total) by mouth every 6 (six) hours as needed for moderate pain.       Contact information for follow-up providers    Rolm Gala, MD. Call in 1 week(s).   Specialty:  Family Medicine Why:  Follow up after SNF Contact information: [address] [place] [address] [phone]            Contact information for after-discharge care    Destination    [facility] SNF Follow up.   Specialty:  Skilled Nursing Facility Contact information: [address] [place] [address] [phone]                 Allergies  Allergen Reactions  . Finasteride Nausea Only and Other (See Comments)    Reaction:  Confusion   . Lisinopril Other (See Comments)    Reaction:  Confusion   Consultations:  Urology via Telephone Conversation  Neurology via [facility]  Gastroenterology via Telephone Conversation  Procedures/Studies: Ct Abdomen Pelvis W Contrast  Result Date: 08/15/2016 CLINICAL DATA:  83 year old male fell 1 week ago. During physical therapy, experience right hip pain. Drop in hemoglobin. Contusion right hip. Subsequent encounter. EXAM: CT ABDOMEN AND PELVIS WITH CONTRAST TECHNIQUE: Multidetector CT imaging of the abdomen and pelvis was performed using the standard protocol following bolus administration of intravenous contrast. CONTRAST:  ISOVUE-300 IOPAMIDOL (ISOVUE-300)  INJECTION 61% COMPARISON:  08/15/2016 plain film exam. 02/23/2011 CT abdomen and pelvis. FINDINGS: Lower chest: Basilar subsegmental atelectasis. Heart slightly enlarged. Mitral and aortic valve calcification. Coronary artery calcification. Hepatobiliary: No worrisome hepatic lesion. No  calcified gallstones. Pancreas: No pancreatic mass, inflammation or pancreatic duct dilation. Spleen: No mass or enlargement. Adrenals/Urinary Tract: Chronic right ureteral pelvic junction obstruction with marked hydronephrosis and renal cortical thinning. Three nonobstructing right renal calculi measuring up to 8 mm. No worrisome renal or adrenal mass. Calcification right posterior lateral bladder. Question dependent small stones versus wall calcification. Impression upon the bladder base by enlarged prostate gland. Clinical and laboratory correlation recommended. Stomach/Bowel: Moderate-size hiatal hernia. Colonic diverticulosis. No primary bowel inflammatory process free fluid or free air. Vascular/Lymphatic: Aortic calcification and ectasia without focal aneurysm or large vessel occlusion. No adenopathy. Reproductive: Enlarged prostate gland impresses upon the bladder base. Clinical and laboratory correlation recommended. Other: No psoas muscle or rectus muscle hematoma. Musculoskeletal: Hematoma within the right gluteus maximus lateral to the right greater trochanter measures approximately 4 x 7 x 11 cm. No underlying right hip fracture detected. Scoliosis lumbar spine convex left with superimposed degenerative changes. IMPRESSION: Hematoma within the right gluteus maximus muscle lateral to the right greater trochanter measures approximately 4 x 7 x 11 cm. No underlying right hip fracture detected. Chronic right ureteral pelvic junction obstruction with marked hydronephrosis and renal cortical thinning. Three nonobstructing right renal calculi measuring up to 8 mm. Calcification right posterior lateral bladder. Question  dependent small stones versus wall calcification. Impression upon the bladder base by enlarged prostate gland. Clinical and laboratory correlation recommended. Moderate-size hiatal hernia. Colonic diverticulosis. Aortic atherosclerosis. Electronically Signed   By: Lacy Duverney M.D.   On: 08/15/2016 15:07   Dg Hip Unilat With Pelvis 2-3 Views Right  Result Date: 08/15/2016 CLINICAL DATA:  Right hip pain. EXAM: DG HIP (WITH OR WITHOUT PELVIS) 2-3V RIGHT COMPARISON:  None. FINDINGS: There is no evidence of hip fracture or dislocation. There is no evidence of arthropathy or other focal bone abnormality. IMPRESSION: Negative. Electronically Signed   By: Kennith Center M.D.   On: 08/15/2016 12:01     Subjective: Seen and examined and was having some pain in legs. No nausea or vomiting. Was a little annoyed because of his foley catheter coming off. No other concerns or complaints but ready to go back to SNF.   Discharge Exam: Vitals:   08/17/16 2118 08/18/16 0639  BP: (!) 119/46 (!) 115/55  Pulse: 76 73  Resp: 18 16  Temp: 98 F (36.7 C) 97.8 F (36.6 C)   Vitals:   08/17/16 0530 08/17/16 1519 08/17/16 2118 08/18/16 0639  BP: 122/67 (!) 121/52 (!) 119/46 (!) 115/55  Pulse: 76 62 76 73  Resp: 18 18 18 16   Temp: 97 F (36.1 C) 97.6 F (36.4 C) 98 F (36.7 C) 97.8 F (36.6 C)  TempSrc: Oral Oral Oral Oral  SpO2: 97% 98% 94% 97%  Weight:      Height:       General: Pt is alert, awake, not in acute distress Cardiovascular: RRR, S1/S2 +, no rubs, no gallops Respiratory: CTA bilaterally, no wheezing, no rhonchi, Patient not tachypenic or using any accessory muscles to breathe Abdominal: Soft, NT, ND, bowel sounds + Extremities: Right Hip bruise noted, unable to turn to view buttock bruise/hematoma. Mild lower right extremity edema noted.   The results of significant diagnostics from this hospitalization (including imaging, microbiology, ancillary and laboratory) are listed below for  reference.    Microbiology: No results found for this or any previous visit (from the past 240 hour(s)).   Labs: BNP (last 3 results) No results for input(s): BNP in the last 8760 hours.  Basic Metabolic Panel:  Recent Labs Lab 08/15/16 1129 08/16/16 0617 08/17/16 0541 08/18/16 0517  NA 138 136 138 137  K 3.7 3.9 4.1 3.5  CL 108 107 109 107  CO2 23 24 24 25   GLUCOSE 93 93 93 91  BUN 24* 23* 22* 21*  CREATININE 1.19 1.15 0.96 1.05  CALCIUM 9.3 8.8* 8.8* 8.7*  MG  --   --  1.8 1.7  PHOS  --   --  3.5 3.7   Liver Function Tests:  Recent Labs Lab 08/17/16 0541 08/18/16 0517  AST 17 15  ALT 15* 14*  ALKPHOS 66 63  BILITOT 0.5 0.6  PROT 5.2* 5.0*  ALBUMIN 2.6* 2.5*   No results for input(s): LIPASE, AMYLASE in the last 168 hours. No results for input(s): AMMONIA in the last 168 hours. CBC:  Recent Labs Lab 08/15/16 1129 08/16/16 0617 08/17/16 0541 08/18/16 0517  WBC 10.8* 7.8 6.9 7.2  NEUTROABS 7.9*  --  4.0 4.2  HGB 9.6* 8.9* 8.7* 8.8*  HCT 28.7* 27.2* 26.9* 26.7*  MCV 91.7 92.2 91.2 92.4  PLT 421* 392 379 385   Cardiac Enzymes: No results for input(s): CKTOTAL, CKMB, CKMBINDEX, TROPONINI in the last 168 hours. BNP: Invalid input(s): POCBNP CBG: No results for input(s): GLUCAP in the last 168 hours. D-Dimer No results for input(s): DDIMER in the last 72 hours. Hgb A1c No results for input(s): HGBA1C in the last 72 hours. Lipid Profile No results for input(s): CHOL, HDL, LDLCALC, TRIG, CHOLHDL, LDLDIRECT in the last 72 hours. Thyroid function studies No results for input(s): TSH, T4TOTAL, T3FREE, THYROIDAB in the last 72 hours.  Invalid input(s): FREET3 Anemia work up No results for input(s): VITAMINB12, FOLATE, FERRITIN, TIBC, IRON, RETICCTPCT in the last 72 hours. Urinalysis    Component Value Date/Time   COLORURINE YELLOW (A) 05/25/2016 2337   APPEARANCEUR CLEAR (A) 05/25/2016 2337   APPEARANCEUR Clear 07/12/2011 1236   LABSPEC 1.011  05/25/2016 2337   LABSPEC 1.010 07/12/2011 1236   PHURINE 7.0 05/25/2016 2337   GLUCOSEU NEGATIVE 05/25/2016 2337   GLUCOSEU Negative 07/12/2011 1236   HGBUR SMALL (A) 05/25/2016 2337   BILIRUBINUR NEGATIVE 05/25/2016 2337   BILIRUBINUR Negative 07/12/2011 1236   KETONESUR NEGATIVE 05/25/2016 2337   PROTEINUR NEGATIVE 05/25/2016 2337   NITRITE NEGATIVE 05/25/2016 2337   LEUKOCYTESUR LARGE (A) 05/25/2016 2337   LEUKOCYTESUR Negative 07/12/2011 1236   Sepsis Labs Invalid input(s): PROCALCITONIN,  WBC,  LACTICIDVEN Microbiology No results found for this or any previous visit (from the past 240 hour(s)).  Time coordinating discharge: 40 minutes  SIGNED:  Merlene Laughter, DO [facility] 08/18/2016, 11:49 AM Pager [phone]  If 7PM-7AM, please contact night-coverage [email or web address] Password [email or web address]

## 2016-08-18 NOTE — Progress Notes (Signed)
Patient is set to discharge back to [facility] today. Patient & spouse, Oleta Mouse at bedside made aware. Discharge packet given to RN, Luanna Cole. [facility] called for transport to pickup at 1:30pm.    Lincoln Maxin, LCSW [facility] Clinical Social Worker cell #: [phone] '

## 2016-09-11 ENCOUNTER — Ambulatory Visit: Payer: Medicare Other | Admitting: Gastroenterology

## 2016-10-11 DIAGNOSIS — E538 Deficiency of other specified B group vitamins: Secondary | ICD-10-CM | POA: Insufficient documentation

## 2016-10-11 DIAGNOSIS — Z8719 Personal history of other diseases of the digestive system: Secondary | ICD-10-CM | POA: Insufficient documentation

## 2016-10-11 DIAGNOSIS — R531 Weakness: Secondary | ICD-10-CM | POA: Insufficient documentation

## 2016-10-11 DIAGNOSIS — Z8673 Personal history of transient ischemic attack (TIA), and cerebral infarction without residual deficits: Secondary | ICD-10-CM | POA: Insufficient documentation

## 2016-10-11 DIAGNOSIS — Z86718 Personal history of other venous thrombosis and embolism: Secondary | ICD-10-CM | POA: Insufficient documentation

## 2016-11-27 ENCOUNTER — Encounter: Payer: Self-pay | Admitting: Gastroenterology

## 2017-05-28 ENCOUNTER — Emergency Department
Admission: EM | Admit: 2017-05-28 | Discharge: 2017-05-28 | Disposition: A | Discharge status: Home / Self Care | Payer: Medicare Other | Attending: Emergency Medicine | Admitting: Emergency Medicine

## 2017-05-28 ENCOUNTER — Other Ambulatory Visit: Payer: Self-pay

## 2017-05-28 ENCOUNTER — Emergency Department: Payer: Medicare Other

## 2017-05-28 ENCOUNTER — Encounter: Payer: Self-pay | Admitting: Emergency Medicine

## 2017-05-28 DIAGNOSIS — I10 Essential (primary) hypertension: Secondary | ICD-10-CM | POA: Insufficient documentation

## 2017-05-28 DIAGNOSIS — Z79899 Other long term (current) drug therapy: Secondary | ICD-10-CM | POA: Insufficient documentation

## 2017-05-28 DIAGNOSIS — R0789 Other chest pain: Secondary | ICD-10-CM | POA: Insufficient documentation

## 2017-05-28 DIAGNOSIS — J9811 Atelectasis: Secondary | ICD-10-CM | POA: Diagnosis not present

## 2017-05-28 DIAGNOSIS — Z87891 Personal history of nicotine dependence: Secondary | ICD-10-CM | POA: Diagnosis not present

## 2017-05-28 DIAGNOSIS — Z8673 Personal history of transient ischemic attack (TIA), and cerebral infarction without residual deficits: Secondary | ICD-10-CM | POA: Insufficient documentation

## 2017-05-28 DIAGNOSIS — Z7901 Long term (current) use of anticoagulants: Secondary | ICD-10-CM | POA: Diagnosis not present

## 2017-05-28 DIAGNOSIS — R079 Chest pain, unspecified: Secondary | ICD-10-CM | POA: Diagnosis present

## 2017-05-28 LAB — BASIC METABOLIC PANEL
ANION GAP: 8 (ref 5–15)
BUN: 20 mg/dL (ref 6–20)
CHLORIDE: 105 mmol/L (ref 101–111)
CO2: 24 mmol/L (ref 22–32)
Calcium: 9.4 mg/dL (ref 8.9–10.3)
Creatinine, Ser: 0.99 mg/dL (ref 0.61–1.24)
GFR calc Af Amer: >60 mL/min (ref >60)
GFR calc non Af Amer: >60 mL/min (ref >60)
GLUCOSE: 99 mg/dL (ref 65–99)
POTASSIUM: 3.7 mmol/L (ref 3.5–5.1)
SODIUM: 137 mmol/L (ref 135–145)

## 2017-05-28 LAB — CBC
HEMATOCRIT: 41.9 % (ref 40.0–52.0)
HEMOGLOBIN: 14 g/dL (ref 13.0–18.0)
MCH: 30.5 pg (ref 26.0–34.0)
MCHC: 33.4 g/dL (ref 32.0–36.0)
MCV: 91.3 fL (ref 80.0–100.0)
Platelets: 292 10*3/uL (ref 150–440)
RBC: 4.59 MIL/uL (ref 4.40–5.90)
RDW: 13.3 % (ref 11.5–14.5)
WBC: 10.4 10*3/uL (ref 3.8–10.6)

## 2017-05-28 LAB — TROPONIN I: Troponin I: <0.03 ng/mL (ref <0.03)

## 2017-05-28 NOTE — ED Provider Notes (Signed)
[facility] Emergency Department Provider Note  ____________________________________________   First MD Initiated Contact with Patient 05/28/17 [phone]     (approximate)  I have reviewed the triage vital signs and the nursing notes.   HISTORY  Chief Complaint Fall and Chest Pain   HPI Matthew Greer is a 83 y.o. male with a history of hypertension as well as CVA who is presenting to the emergency department today with left-sided thoracic pain.  He says that he was sitting in his wheelchair 5 days ago when he fell out of his wheelchair onto the floor.  He hit his left ribs.  Since then he has been having pain in the left ribs.  He is concerned about a fractured rib.  He did not hit his head or lose consciousness.  Patient's guardian who is at the bedside says that he takes Plavix, not Eliquis.  Patient is not reporting any nausea vomiting or diaphoresis.  Says the pain is a 5-6 out of 10 right now.  Past Medical History:  Diagnosis Date  . Hypertension   . Stroke [facility]) 2013   "minor" - no deficits    Patient Active Problem List   Diagnosis Date Noted  . Acute blood loss anemia 08/15/2016  . Hip hematoma, right, initial encounter 08/15/2016  . CVA (cerebral vascular accident) (HCC) 05/25/2016  . Problems with swallowing and mastication   . Stricture and stenosis of esophagus   . Benign essential hypertension 01/27/2016  . Kidney stones 01/27/2016  . Other nonspecific abnormal finding of lung field 01/27/2016  . Pure hypercholesterolemia 01/27/2016  . Stroke (HCC) 01/27/2016  . 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 Rotator cuff tendonitis 11/10/2013  . Hearing loss 04/09/2013  . Eczema 07/26/2012  . Abdominal pain 12/27/2011    Past Surgical History:  Procedure Laterality Date  . ESOPHAGEAL DILATION  02/24/2016   Procedure: ESOPHAGEAL DILATION;  Surgeon: Midge Minium, MD;  Location: [facility];  Service: Endoscopy;;  .  ESOPHAGOGASTRODUODENOSCOPY (EGD) WITH PROPOFOL N/A 02/24/2016   Procedure: ESOPHAGOGASTRODUODENOSCOPY (EGD) WITH PROPOFOL;  Surgeon: Midge Minium, MD;  Location: [facility];  Service: Endoscopy;  Laterality: N/A;  . ESOPHAGOGASTRODUODENOSCOPY ENDOSCOPY  03/2013   Dr. Servando Snare  . THORACOTOMY  1953    Prior to Admission medications   Medication Sig Start Date End Date Taking? Authorizing Provider  acetaminophen (TYLENOL) 325 MG tablet Take 325 mg by mouth every 6 (six) hours as needed for mild pain, moderate pain, fever or headache.     [provider]  amLODipine (NORVASC) 10 MG tablet Take 1 tablet (10 mg total) by mouth daily. 05/28/16   Adrian Saran, MD  apixaban (ELIQUIS) 5 MG TABS tablet Take 5 mg by mouth 2 (two) times daily.    [provider]  atorvastatin (LIPITOR) 10 MG tablet Take 10 mg by mouth daily at 6 PM.     [provider]  cyclobenzaprine (FEXMID) 7.5 MG tablet Take 1 tablet (7.5 mg total) by mouth 3 (three) times daily as needed for muscle spasms. 08/17/16   Marguerita Merles Latif, DO  metoprolol (LOPRESSOR) 50 MG tablet Take 1 tablet (50 mg total) by mouth 2 (two) times daily. 05/28/16   Adrian Saran, MD  oxyCODONE (OXY IR/ROXICODONE) 5 MG immediate release tablet Take 1 tablet (5 mg total) by mouth every 4 (four) hours as needed for moderate pain. 08/17/16   Marguerita Merles Latif, DO  polyethylene glycol [facility] Ethelene Hal) packet Take  17 g by mouth daily. 08/18/16   Marguerita MerlesSheikh, Omair Latif, DO  ranitidine (ZANTAC) 150 MG tablet Take 150 mg by mouth daily.    [provider]  senna-docusate (SENOKOT-S) 8.6-50 MG tablet Take 1 tablet by mouth 2 (two) times daily. 08/18/16   Marguerita MerlesSheikh, Omair Latif, DO  traMADol (ULTRAM) 50 MG tablet Take 1 tablet (50 mg total) by mouth every 6 (six) hours as needed for moderate pain. 08/17/16   Marguerita MerlesSheikh, Omair Latif, DO    Allergies Finasteride and Lisinopril  No family history on file.  Social History Social History     Tobacco Use  . Smoking status: Former Games developermoker  . Smokeless tobacco: Never Used  Substance Use Topics  . Alcohol use: No  . Drug use: Not on file    Review of Systems  Constitutional: No fever/chills Eyes: No visual changes. ENT: No sore throat. Cardiovascular: As above Respiratory: Denies shortness of breath. Gastrointestinal: No abdominal pain.  No nausea, no vomiting.  No diarrhea.  No constipation. Genitourinary: Negative for dysuria. Musculoskeletal: Negative for back pain. Skin: Negative for rash. Neurological: Negative for headaches, focal weakness or numbness.   ____________________________________________   PHYSICAL EXAM:  VITAL SIGNS: ED Triage Vitals  Enc Vitals Group     BP 05/28/17 1030 (!) 152/79     Pulse Rate 05/28/17 1030 61     Resp 05/28/17 1030 (!) 22     Temp 05/28/17 1034 98.6 F (37 C)     Temp Source 05/28/17 1034 Oral     SpO2 05/28/17 1030 95 %     Weight 05/28/17 0914 174 lb (78.9 kg)     Height 05/28/17 1034 5\' 11"  (1.803 m)     Head Circumference --      Peak Flow --      Pain Score --      Pain Loc --      Pain Edu? --      Excl. in GC? --     Constitutional: Alert and oriented. Well appearing and in no acute distress. Eyes: Conjunctivae are normal.  Head: Atraumatic. Nose: No congestion/rhinnorhea. Mouth/Throat: Mucous membranes are moist.  Neck: No stridor.   Cardiovascular: Normal rate, regular rhythm. Grossly normal heart sounds.   Respiratory: Normal respiratory effort.  No retractions. Lungs CTAB. Gastrointestinal: Soft and nontender. No distention.  Musculoskeletal: No lower extremity tenderness nor edema.  No joint effusions.  Tenderness to palpation over the left thoracic wall to the lower region of ribs 9 through 11.  No overlying ecchymosis.  No crepitus.  No deformity.  Neurologic:  Normal speech and language. No gross focal neurologic deficits are appreciated. Skin:  Skin is warm, dry and intact. No rash  noted. Psychiatric: Mood and affect are normal. Speech and behavior are normal.  ____________________________________________   LABS (all labs ordered are listed, but only abnormal results are displayed)  Labs Reviewed  BASIC METABOLIC PANEL  CBC  TROPONIN I   ____________________________________________  EKG  ED ECG REPORT I, Arelia Longestavid M Capri Raben, the attending physician, personally viewed and interpreted this ECG.   Date: 05/28/2017  EKG Time: 0914  Rate: Approximately 80.  Machine read as 102 but this is likely secondary to the static in the baseline.  Rhythm: sinus tachycardia  Axis: Normal  Intervals:Prolonged QT, however this is likely confounded by the static and the baseline as well.  ST&T Change: No ST segment elevation or depression.  No abnormal T wave inversion.  ____________________________________________  RADIOLOGY  Cathlean SauerX-ray  with mild subsegmental atelectasis left lung base without any acute disease.  CT scan without any acute fracture.  Focal opacity to the medial left lung base which could represent atelectasis or infiltrate.  Atelectasis is favored.  Also with prominent subcarinal node possibly reactive. ____________________________________________   PROCEDURES  Procedure(s) performed:   Procedures  Critical Care performed:   ____________________________________________   INITIAL IMPRESSION / ASSESSMENT AND PLAN / ED COURSE  Pertinent labs & imaging results that were available during my care of the patient were reviewed by me and considered in my medical decision making (see chart for details).  Differential diagnosis includes, but is not limited to, ACS, aortic dissection, pulmonary embolism, cardiac tamponade, pneumothorax, pneumonia, pericarditis, myocarditis, GI-related causes including esophagitis/gastritis, and musculoskeletal chest wall pain.   As part of my medical decision making, I reviewed the following data within the electronic medical  record:  Old chart reviewed  ----------------------------------------- 11:36 AM on 05/28/2017 -----------------------------------------  Patient continues to be without any distress.  Able to take a deep breath without any issue.  I discussed with him and his guardian the CT results including that there are no rib fractures but there is atelectasis found.  We discussed using incentive spirometry at home, 10 breaths every time he is watching TV in a commercial comes on.  The patient will continue taking Tylenol as well as start using icy hot or Aspercreme over the affected area.  Feel that the finding of atelectasis makes the most clinical sense.  The patient without cough, although white blood cell count or fever.  Unlikely to be pneumonia.  We also discussed the prominent lymph node and the need for follow-up with primary care.  Patient and guardian are understanding and willing to comply.      ____________________________________________   FINAL CLINICAL IMPRESSION(S) / ED DIAGNOSES  Blunt chest trauma.  Atelectasis.    NEW MEDICATIONS STARTED DURING THIS VISIT:  This SmartLink is deprecated. Use AVSMEDLIST instead to display the medication list for a patient.   Note:  This document was prepared using Dragon voice recognition software and may include unintentional dictation errors.     Myrna Blazer, MD 05/28/17 [phone]

## 2017-05-28 NOTE — ED Notes (Signed)
Pt educated on usage of incentive spirometry and pt gave visual demonstration back to nurse to show understanding - family also educated

## 2017-05-28 NOTE — ED Triage Notes (Addendum)
Pt fell 2 days ago and chest pain this am.

## 2017-09-16 IMAGING — CT CT ABD-PELV W/ CM
2 of 5 series · 15 of 46 positions shown, 17 images · IV contrast (ISOVUE)
Comparison: 08/15/2016 plain film exam. 02/23/2011 CT abdomen and
pelvis.

CLINICAL DATA: 89-year-old male fell 1 week ago. During physical
therapy, experience right hip pain. Drop in hemoglobin. Contusion
right hip. Subsequent encounter.

EXAM:
CT ABDOMEN AND PELVIS WITH CONTRAST
TECHNIQUE: Multidetector CT imaging of the abdomen and pelvis was performed
using the standard protocol following bolus administration of
intravenous contrast.
CONTRAST:  100mL VBEMB8-AKK IOPAMIDOL (VBEMB8-AKK) INJECTION 61%

[Series 2: abd/pel with · axial · 0.94mm/px · z∈[-526,-86]mm · 12 of 102 slices shown, 14 images]
[im 7/102  soft-tissue]
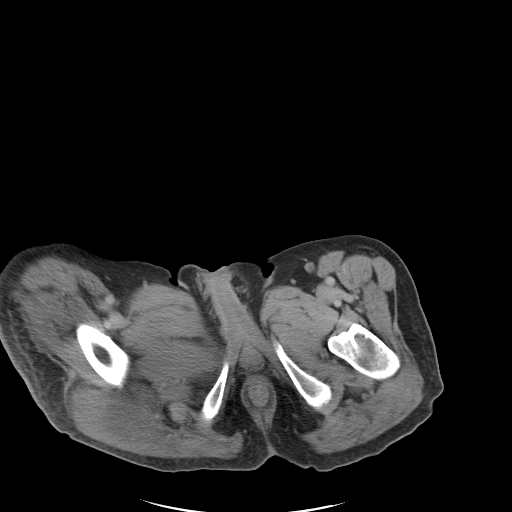
[im 7/102  bone]
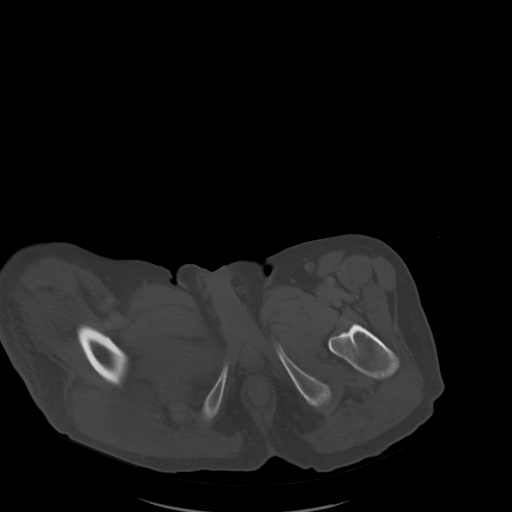
[im 14/102  soft-tissue]
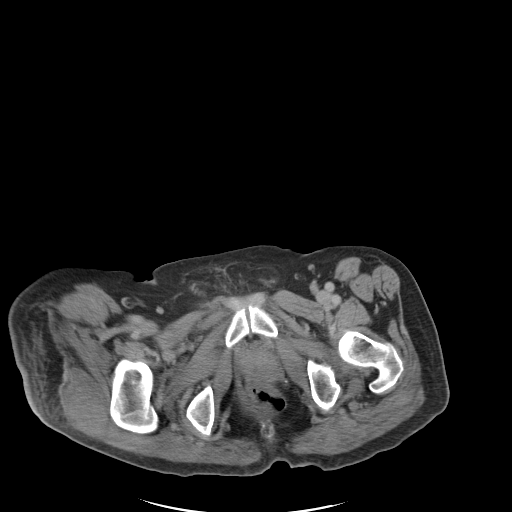
[im 21/102  soft-tissue]
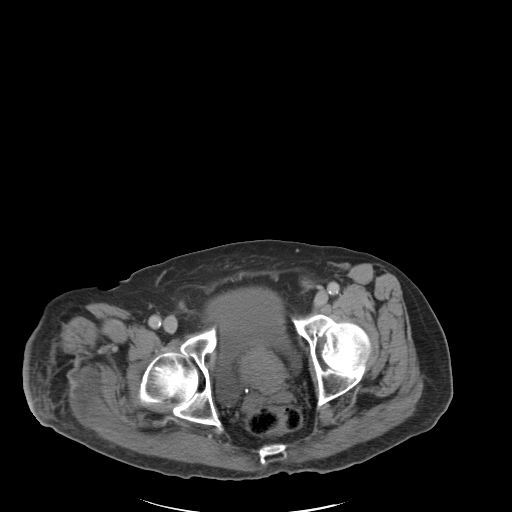
[im 34/102  soft-tissue]
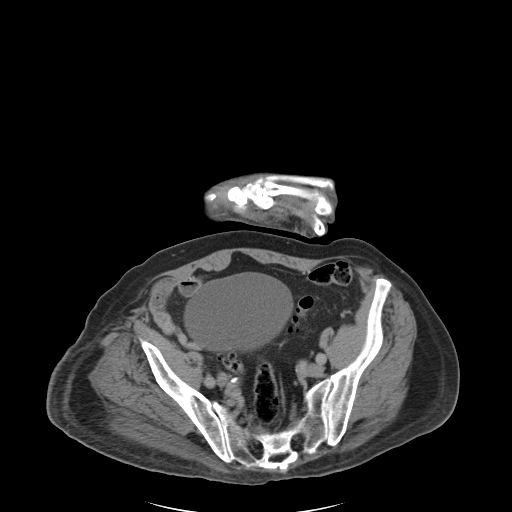
[im 41/102  soft-tissue]
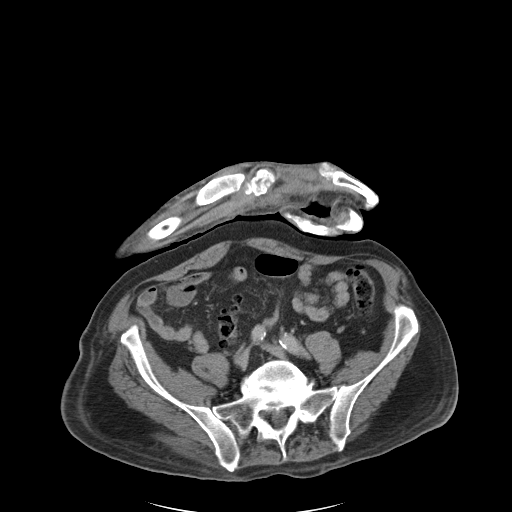
[im 48/102  soft-tissue]
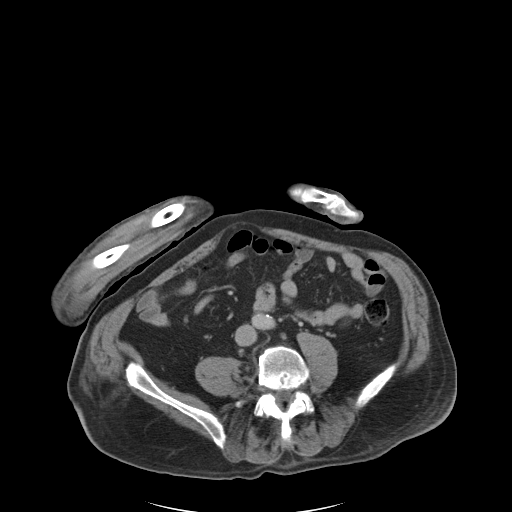
[im 54/102  soft-tissue]
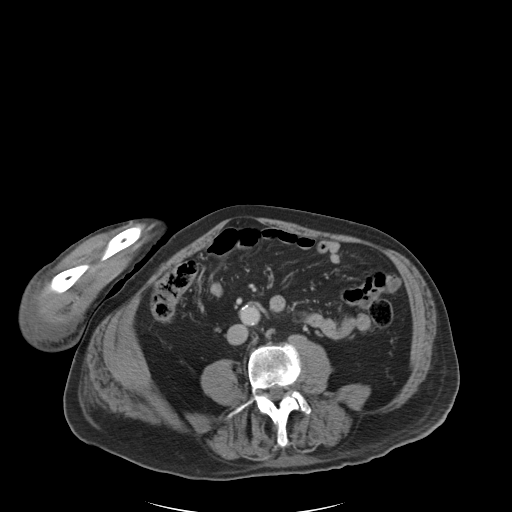
[im 61/102  soft-tissue]
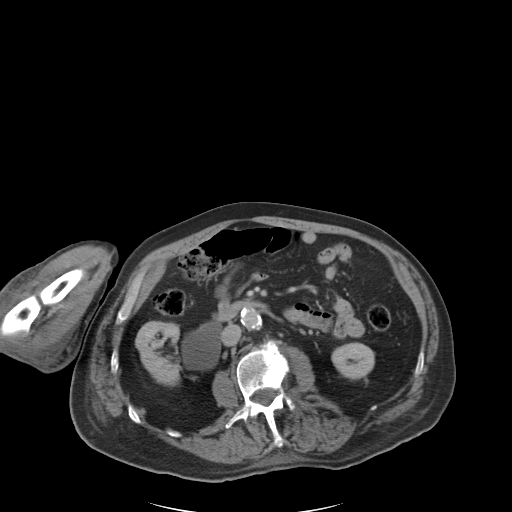
[im 68/102  soft-tissue]
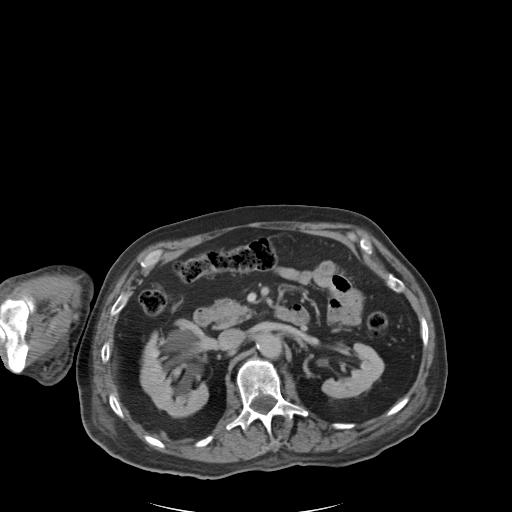
[im 68/102  bone]
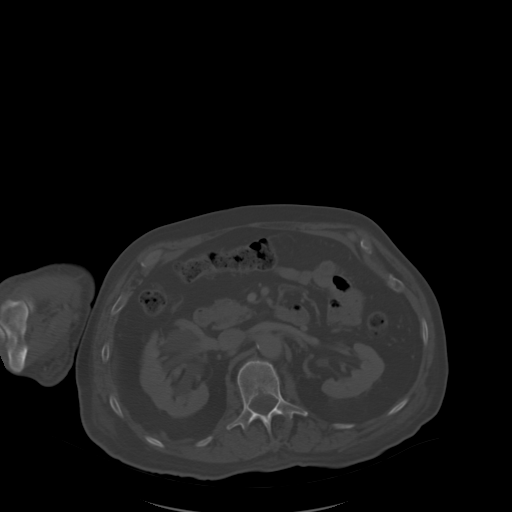
[im 81/102  soft-tissue]
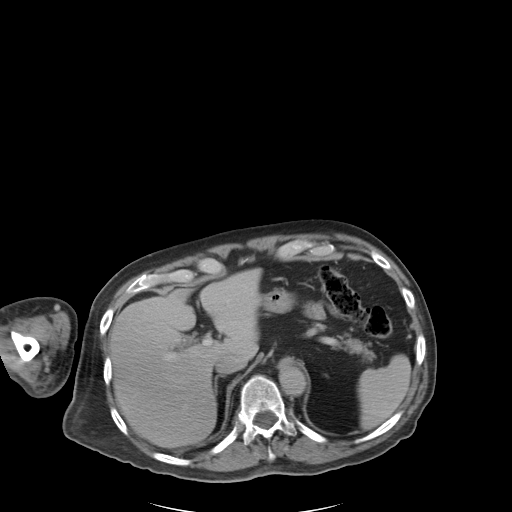
[im 88/102  soft-tissue]
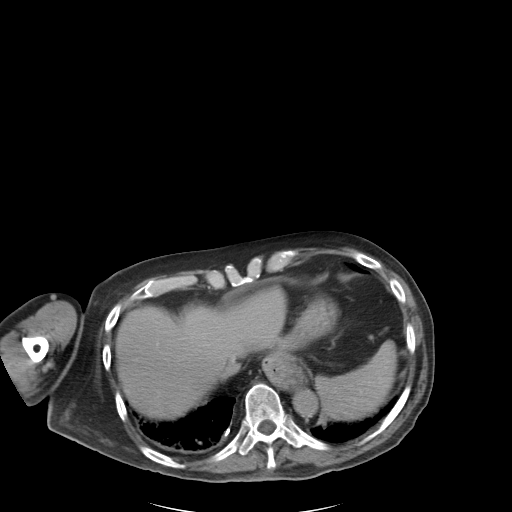
[im 95/102  soft-tissue]
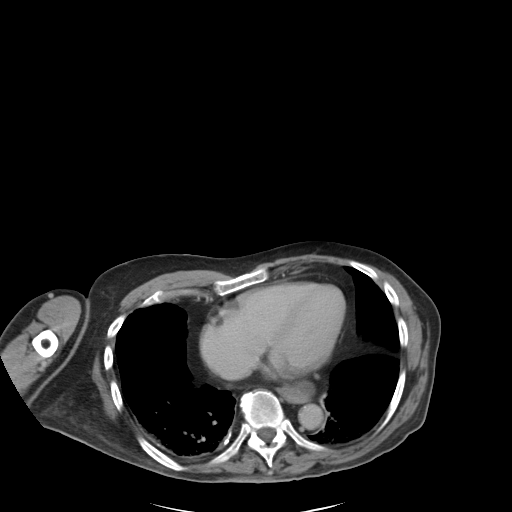

[Series 5: coronal a/|p · coronal · 0.74mm/px · 3 of 137 slices shown]
[im 46/137  soft-tissue]
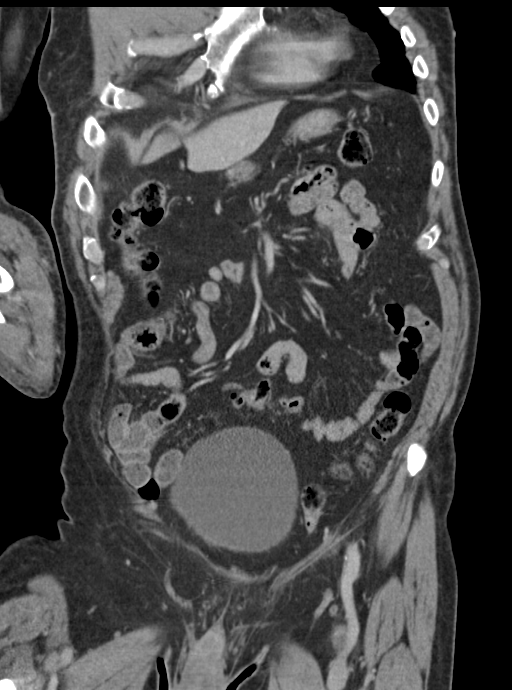
[im 61/137  soft-tissue]
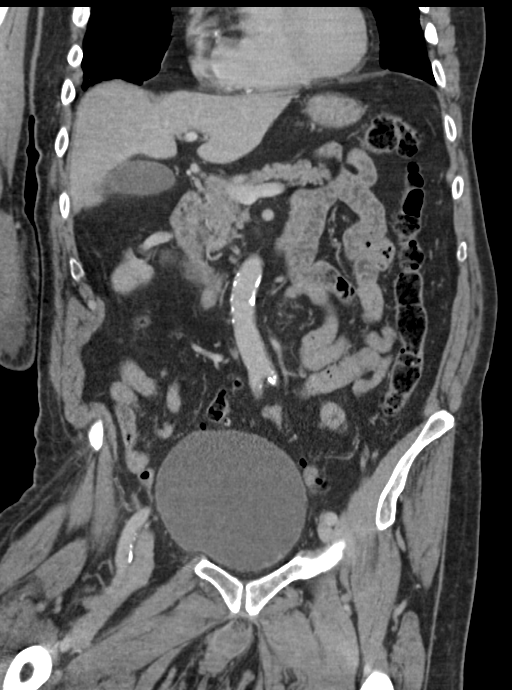
[im 76/137  soft-tissue]
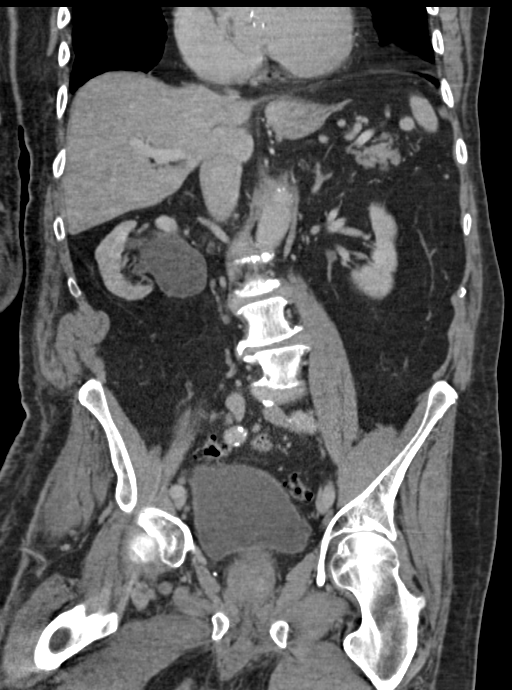

[15 of 46 positions shown; findings below may reference images not displayed]

FINDINGS: Lower chest: Basilar subsegmental atelectasis. Heart slightly
enlarged. Mitral and aortic valve calcification. Coronary artery
calcification.

Hepatobiliary: No worrisome hepatic lesion. No calcified gallstones.

Pancreas: No pancreatic mass, inflammation or pancreatic duct
dilation.

Spleen: No mass or enlargement.

Adrenals/Urinary Tract: Chronic right ureteral pelvic junction
obstruction with marked hydronephrosis and renal cortical thinning.
Three nonobstructing right renal calculi measuring up to 8 mm. No
worrisome renal or adrenal mass.

Calcification right posterior lateral bladder. Question dependent
small stones versus wall calcification. Impression upon the bladder
base by enlarged prostate gland. Clinical and laboratory correlation
recommended.

Stomach/Bowel: Moderate-size hiatal hernia. Colonic diverticulosis.
No primary bowel inflammatory process free fluid or free air.

Vascular/Lymphatic: Aortic calcification and ectasia without focal
aneurysm or large vessel occlusion. No adenopathy.

Reproductive: Enlarged prostate gland impresses upon the bladder
base. Clinical and laboratory correlation recommended.

Other: No psoas muscle or rectus muscle hematoma.

Musculoskeletal: Hematoma within the right gluteus maximus lateral
to the right greater trochanter measures approximately 4 x 7 x 11
cm. No underlying right hip fracture detected.

Scoliosis lumbar spine convex left with superimposed degenerative
changes.
IMPRESSION: Hematoma within the right gluteus maximus muscle lateral to the
right greater trochanter measures approximately 4 x 7 x 11 cm. No
underlying right hip fracture detected.

Chronic right ureteral pelvic junction obstruction with marked
hydronephrosis and renal cortical thinning. Three nonobstructing
right renal calculi measuring up to 8 mm.

Calcification right posterior lateral bladder. Question dependent
small stones versus wall calcification. Impression upon the bladder
base by enlarged prostate gland. Clinical and laboratory correlation
recommended.

Moderate-size hiatal hernia.

Colonic diverticulosis.

Aortic atherosclerosis.

## 2018-03-09 ENCOUNTER — Encounter: Payer: Self-pay | Admitting: Emergency Medicine

## 2018-03-09 ENCOUNTER — Emergency Department
Admission: EM | Admit: 2018-03-09 | Discharge: 2018-03-10 | Disposition: A | Discharge status: Home / Self Care | Payer: Medicare Other | Attending: Emergency Medicine | Admitting: Emergency Medicine

## 2018-03-09 ENCOUNTER — Emergency Department: Payer: Medicare Other

## 2018-03-09 ENCOUNTER — Other Ambulatory Visit: Payer: Self-pay

## 2018-03-09 DIAGNOSIS — I10 Essential (primary) hypertension: Secondary | ICD-10-CM | POA: Diagnosis not present

## 2018-03-09 DIAGNOSIS — N3000 Acute cystitis without hematuria: Secondary | ICD-10-CM

## 2018-03-09 DIAGNOSIS — Z79899 Other long term (current) drug therapy: Secondary | ICD-10-CM | POA: Diagnosis not present

## 2018-03-09 DIAGNOSIS — Z87891 Personal history of nicotine dependence: Secondary | ICD-10-CM | POA: Diagnosis not present

## 2018-03-09 DIAGNOSIS — M7918 Myalgia, other site: Secondary | ICD-10-CM | POA: Diagnosis present

## 2018-03-09 DIAGNOSIS — M791 Myalgia, unspecified site: Secondary | ICD-10-CM

## 2018-03-09 DIAGNOSIS — I69351 Hemiplegia and hemiparesis following cerebral infarction affecting right dominant side: Secondary | ICD-10-CM | POA: Diagnosis not present

## 2018-03-09 LAB — HEPATIC FUNCTION PANEL
ALT: 13 U/L (ref 0–44)
AST: 19 U/L (ref 15–41)
Albumin: 3.6 g/dL (ref 3.5–5.0)
Alkaline Phosphatase: 84 U/L (ref 38–126)
BILIRUBIN INDIRECT: 0.9 mg/dL (ref 0.3–0.9)
Bilirubin, Direct: 0.2 mg/dL (ref 0.0–0.2)
TOTAL PROTEIN: 6.3 g/dL — AB (ref 6.5–8.1)
Total Bilirubin: 1.1 mg/dL (ref 0.3–1.2)

## 2018-03-09 LAB — CBC
HEMATOCRIT: 40.5 % (ref 40.0–52.0)
HEMOGLOBIN: 13.6 g/dL (ref 13.0–18.0)
MCH: 30.5 pg (ref 26.0–34.0)
MCHC: 33.5 g/dL (ref 32.0–36.0)
MCV: 91 fL (ref 80.0–100.0)
Platelets: 281 10*3/uL (ref 150–440)
RBC: 4.45 MIL/uL (ref 4.40–5.90)
RDW: 13.7 % (ref 11.5–14.5)
WBC: 15.2 10*3/uL — ABNORMAL HIGH (ref 3.8–10.6)

## 2018-03-09 LAB — BASIC METABOLIC PANEL
ANION GAP: 7 (ref 5–15)
BUN: 22 mg/dL (ref 8–23)
CHLORIDE: 105 mmol/L (ref 98–111)
CO2: 26 mmol/L (ref 22–32)
Calcium: 9.2 mg/dL (ref 8.9–10.3)
Creatinine, Ser: 1.23 mg/dL (ref 0.61–1.24)
GFR calc Af Amer: 58 mL/min — ABNORMAL LOW (ref >60)
GFR calc non Af Amer: 50 mL/min — ABNORMAL LOW (ref >60)
Glucose, Bld: 113 mg/dL — ABNORMAL HIGH (ref 70–99)
POTASSIUM: 3.6 mmol/L (ref 3.5–5.1)
Sodium: 138 mmol/L (ref 135–145)

## 2018-03-09 LAB — URINALYSIS, COMPLETE (UACMP) WITH MICROSCOPIC
BILIRUBIN URINE: NEGATIVE
Glucose, UA: NEGATIVE mg/dL
Ketones, ur: NEGATIVE mg/dL
NITRITE: POSITIVE — AB
PROTEIN: NEGATIVE mg/dL
Specific Gravity, Urine: 1.014 (ref 1.005–1.030)
pH: 7 (ref 5.0–8.0)

## 2018-03-09 LAB — INFLUENZA PANEL BY PCR (TYPE A & B)
INFLBPCR: NEGATIVE
Influenza A By PCR: NEGATIVE

## 2018-03-09 LAB — LACTIC ACID, PLASMA: LACTIC ACID, VENOUS: 1.2 mmol/L (ref 0.5–1.9)

## 2018-03-09 LAB — TROPONIN I

## 2018-03-09 MED ORDER — SODIUM CHLORIDE 0.9 % IV BOLUS
1000.0000 mL | Freq: Once | INTRAVENOUS | Status: AC
  Administered 2018-03-09: 1000 mL via INTRAVENOUS

## 2018-03-09 MED ORDER — LEVOFLOXACIN 750 MG PO TABS: 750.0000 mg | ORAL_TABLET | Freq: Every day | ORAL | 0 refills | Status: AC

## 2018-03-09 MED ORDER — KETOROLAC TROMETHAMINE 30 MG/ML IJ SOLN
15.0000 mg | Freq: Once | INTRAMUSCULAR | Status: AC
  Administered 2018-03-09: 15 mg via INTRAVENOUS
  Filled 2018-03-09: qty 1

## 2018-03-09 MED ORDER — LEVOFLOXACIN IN D5W 750 MG/150ML IV SOLN
750.0000 mg | Freq: Once | INTRAVENOUS | Status: AC
  Administered 2018-03-09: 750 mg via INTRAVENOUS
  Filled 2018-03-09: qty 150

## 2018-03-09 MED ORDER — METOPROLOL TARTRATE 25 MG PO TABS
25.0000 mg | ORAL_TABLET | Freq: Once | ORAL | Status: AC
  Administered 2018-03-09: 25 mg via ORAL
  Filled 2018-03-09: qty 1

## 2018-03-09 NOTE — ED Triage Notes (Signed)
Pt to ED via POV with friend who states that pt has been c/o weakness and being unable to sleep since last night. Pt is poor historian. Pt states that he is aching all over. Pt is in NAD at this time.

## 2018-03-09 NOTE — Discharge Instructions (Signed)
These take the entire course of antibiotics, even if you are feeling better.  Please follow-up with your primary care physician on Monday for reevaluation.  Return to the emergency department if you develop severe pain, lightheadedness or fainting, fever, nausea or vomiting, shortness of breath, or any other symptoms concerning to you.

## 2018-03-09 NOTE — ED Provider Notes (Signed)
[facility] Emergency Department Provider Note  ____________________________________________  Time seen: Approximately 9:04 PM  I have reviewed the triage vital signs and the nursing notes.   HISTORY  Chief Complaint Weakness    HPI Matthew Greer is a 84 y.o. 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  The patient reports some mild dysuria without urinary frequency.  He has not had fevers or chills, nausea vomiting or diarrhea, no constipation.  No chest pain or shortness of breath.  The patient does report a chronic unchanged cough; no congestion/rhinorrhea or sore throat.   Past Medical History:  Diagnosis Date  . Hypertension   . Stroke [facility]) 2013   "minor" - no deficits    Patient Active Problem List   Diagnosis Date Noted  . Acute blood loss anemia 08/15/2016  . Hip hematoma, right, initial encounter 08/15/2016  . CVA (cerebral vascular accident) (HCC) 05/25/2016  . Problems with swallowing and mastication   . Stricture and stenosis of esophagus   . Benign essential hypertension 01/27/2016  . Kidney stones 01/27/2016  . Other nonspecific abnormal finding of lung field 01/27/2016  . Pure hypercholesterolemia 01/27/2016  . Stroke (HCC) 01/27/2016  . 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 Rotator cuff tendonitis 11/10/2013  . Hearing loss 04/09/2013  . Eczema 07/26/2012  . Abdominal pain 12/27/2011    Past Surgical History:  Procedure Laterality Date  . ESOPHAGEAL DILATION  02/24/2016   Procedure: ESOPHAGEAL DILATION;  Surgeon: Midge Minium, MD;  Location: [facility];  Service: Endoscopy;;  . ESOPHAGOGASTRODUODENOSCOPY (EGD) WITH PROPOFOL N/A 02/24/2016   Procedure: ESOPHAGOGASTRODUODENOSCOPY (EGD) WITH PROPOFOL;  Surgeon: Midge Minium, MD;  Location: [facility];  Service: Endoscopy;  Laterality: N/A;  .  ESOPHAGOGASTRODUODENOSCOPY ENDOSCOPY  03/2013   Dr. Servando Snare  . THORACOTOMY  1953    Current Outpatient Rx  . Order #: [id number] Class: Historical Med  . Order #: [id number] Class: Normal  . Order #: [id number] Class: Historical Med  . Order #: [id number] Class: Historical Med  . Order #: [id number] Class: Normal  . Order #: [id number] Class: Print  . Order #: [id number] Class: Print  . Order #: [id number] Class: Print  . Order #: [id number] Class: Normal  . Order #: [id number] Class: Historical Med  . Order #: [id number] Class: Normal  . Order #: [id number] Class: Normal    Allergies Finasteride and Lisinopril  No family history on file.  Social History Social History   Tobacco Use  . Smoking status: Former Games developer  . Smokeless tobacco: Never Used  Substance Use Topics  . Alcohol use: No  . Drug use: Not on file    Review of Systems Constitutional: No fever/chills.  Positive general malaise and body aches. Eyes: No visual changes. ENT: No sore throat. No congestion or rhinorrhea.  No ear pain. Cardiovascular: Denies chest pain. Denies palpitations. Respiratory: Denies shortness of breath.  As of chronic unchanged cough. Gastrointestinal: No abdominal pain.  No nausea, no vomiting.  No diarrhea.  No constipation. Genitourinary: Positive for dysuria without urinary frequency or hematuria.. Musculoskeletal: Negative for back pain. Skin: Negative for rash. Neurological: Negative for headaches. No focal numbness, tingling or weakness.     ____________________________________________   PHYSICAL EXAM:  VITAL SIGNS: ED Triage Vitals  Enc Vitals Group     BP 03/09/18 1748 (!) 175/75     Pulse Rate 03/09/18 1748 64     Resp 03/09/18 1748 12  Temp 03/09/18 1748 98.4 F (36.9 C)     Temp Source 03/09/18 1748 Oral     SpO2 03/09/18 1748 98 %     Weight --      Height 03/09/18 1748 5\' 11"  (1.803 m)     Head Circumference --      Peak Flow --      Pain Score 03/09/18 1754 8     Pain  Loc --      Pain Edu? --      Excl. in GC? --     Constitutional: Alert and oriented.  Answers questions appropriately.  Chronically ill-appearing. Eyes: Conjunctivae are normal.  EOMI. No scleral icterus.  No eye discharge. Head: Atraumatic. Nose: No congestion/rhinnorhea. Mouth/Throat: Mucous membranes are moist.  Neck: No stridor.  Supple.  No JVD.  No meningismus. Cardiovascular: Normal rate, regular rhythm. No murmurs, rubs or gallops.  Respiratory: Mild tachypnea without accessory muscle use or retractions.  The patient has rales in the bases on the right side.  No wheezes or rhonchi. Gastrointestinal: Soft, nontender and nondistended.  No guarding or rebound.  No peritoneal signs. Musculoskeletal: No LE edema. Neurologic:  A&Ox3.  Speech is clear.  Face and smile are symmetric.  EOMI.  Moves all extremities well. Skin:  Skin is warm, dry and intact. No rash noted. Psychiatric: Mood and affect are normal.   ____________________________________________   LABS (all labs ordered are listed, but only abnormal results are displayed)  Labs Reviewed  BASIC METABOLIC PANEL - Abnormal; Notable for the following components:      Result Value   Glucose, Bld 113 (*)    GFR calc non Af Amer 50 (*)    GFR calc Af Amer 58 (*)    All other components within normal limits  CBC - Abnormal; Notable for the following components:   WBC 15.2 (*)    All other components within normal limits  URINALYSIS, COMPLETE (UACMP) WITH MICROSCOPIC - Abnormal; Notable for the following components:   Color, Urine YELLOW (*)    APPearance HAZY (*)    Hgb urine dipstick SMALL (*)    Nitrite POSITIVE (*)    Leukocytes, UA LARGE (*)    Bacteria, UA MANY (*)    All other components within normal limits  HEPATIC FUNCTION PANEL - Abnormal; Notable for the following components:   Total Protein 6.3 (*)    All other components within normal limits  CULTURE, BLOOD (ROUTINE X 2)  CULTURE, BLOOD (ROUTINE X 2)   TROPONIN I  INFLUENZA PANEL BY PCR (TYPE A & B)  LACTIC ACID, PLASMA  LACTIC ACID, PLASMA  CBG MONITORING, ED   ____________________________________________  EKG  ED ECG REPORT I, Anne-Caroline Sharma Covert, the attending physician, personally viewed and interpreted this ECG.   Date: 03/09/2018  EKG Time: 1751  Rate: 64  Rhythm: normal sinus rhythm  Axis: normal  Intervals:first-degree A-V block   ST&T Change: No STEMI  ____________________________________________  RADIOLOGY  Dg Chest 2 View  Result Date: 03/09/2018 CLINICAL DATA:  84 y/o M; weakness, unable to sleep, aching all over, cough. EXAM: CHEST - 2 VIEW COMPARISON:  05/28/2017 chest radiograph and CT chest. FINDINGS: Stable normal cardiac silhouette. Aortic atherosclerosis with calcification. Clear lungs. No pleural effusion or pneumothorax. No acute osseous abnormality is evident. IMPRESSION: No acute pulmonary process identified. Electronically Signed   By: Mitzi Hansen M.D.   On: 03/09/2018 22:48    ____________________________________________   PROCEDURES  Procedure(s) performed: None  Procedures  Critical Care performed: No ____________________________________________   INITIAL IMPRESSION / ASSESSMENT AND PLAN / ED COURSE  Pertinent labs & imaging results that were available during my care of the patient were reviewed by me and considered in my medical decision making (see chart for details).  84 y.o. male with 2 days of generalized malaise and body aches, mild dysuria.  Overall, the patient is hypertensive and has not had his evening medications so I will give him those.  He is afebrile.  His pulmonary examination is concerning for an infiltrate in the right lower lobe and a chest x-ray has been ordered.  Influenza, although early in the season, is also considered and has been ordered.  Urinalysis to rule out UTI has been ordered as well.  Will check for electrolyte abnormalities, anemia, and  plan reevaluation for final disposition.  ____________________________________________  FINAL CLINICAL IMPRESSION(S) / ED DIAGNOSES  Final diagnoses:  Acute cystitis without hematuria  Myalgia         NEW MEDICATIONS STARTED DURING THIS VISIT:  New Prescriptions   LEVOFLOXACIN (LEVAQUIN) 750 MG TABLET    Take 1 tablet (750 mg total) by mouth daily for 7 days.      Rockne Menghini, MD 03/09/18 [phone]

## 2018-03-09 NOTE — ED Notes (Signed)
Report given to Noel RN 

## 2018-03-10 NOTE — ED Notes (Signed)
Peripheral IV discontinued. Catheter intact. No signs of infiltration or redness. Gauze applied to IV site.   Discharge instructions reviewed with patient. Questions fielded by this RN. Patient verbalizes understanding of instructions. Patient discharged home in stable condition per norman. No acute distress noted at time of discharge.

## 2018-03-14 LAB — CULTURE, BLOOD (ROUTINE X 2)
Culture: NO GROWTH
Culture: NO GROWTH

## 2018-03-20 DIAGNOSIS — I440 Atrioventricular block, first degree: Secondary | ICD-10-CM | POA: Insufficient documentation

## 2018-09-04 DEATH — deceased

## 2019-04-10 ENCOUNTER — Emergency Department: Payer: Medicare Other

## 2019-04-10 ENCOUNTER — Other Ambulatory Visit: Payer: Self-pay

## 2019-04-10 ENCOUNTER — Inpatient Hospital Stay
Admission: EM | Admit: 2019-04-10 | Discharge: 2019-04-13 | DRG: 065 | Disposition: A | Discharge status: Home Health | Payer: Medicare Other | Attending: Internal Medicine | Admitting: Internal Medicine

## 2019-04-10 ENCOUNTER — Inpatient Hospital Stay: Payer: Medicare Other

## 2019-04-10 DIAGNOSIS — H9190 Unspecified hearing loss, unspecified ear: Secondary | ICD-10-CM | POA: Diagnosis present

## 2019-04-10 DIAGNOSIS — N401 Enlarged prostate with lower urinary tract symptoms: Secondary | ICD-10-CM | POA: Diagnosis present

## 2019-04-10 DIAGNOSIS — N3000 Acute cystitis without hematuria: Secondary | ICD-10-CM | POA: Diagnosis not present

## 2019-04-10 DIAGNOSIS — R29709 NIHSS score 9: Secondary | ICD-10-CM | POA: Diagnosis present

## 2019-04-10 DIAGNOSIS — N139 Obstructive and reflux uropathy, unspecified: Secondary | ICD-10-CM | POA: Diagnosis present

## 2019-04-10 DIAGNOSIS — I129 Hypertensive chronic kidney disease with stage 1 through stage 4 chronic kidney disease, or unspecified chronic kidney disease: Secondary | ICD-10-CM | POA: Diagnosis present

## 2019-04-10 DIAGNOSIS — E785 Hyperlipidemia, unspecified: Secondary | ICD-10-CM | POA: Diagnosis present

## 2019-04-10 DIAGNOSIS — Z79899 Other long term (current) drug therapy: Secondary | ICD-10-CM | POA: Diagnosis not present

## 2019-04-10 DIAGNOSIS — Z888 Allergy status to other drugs, medicaments and biological substances status: Secondary | ICD-10-CM

## 2019-04-10 DIAGNOSIS — I160 Hypertensive urgency: Secondary | ICD-10-CM | POA: Diagnosis present

## 2019-04-10 DIAGNOSIS — R4182 Altered mental status, unspecified: Secondary | ICD-10-CM | POA: Diagnosis present

## 2019-04-10 DIAGNOSIS — I69351 Hemiplegia and hemiparesis following cerebral infarction affecting right dominant side: Secondary | ICD-10-CM

## 2019-04-10 DIAGNOSIS — I10 Essential (primary) hypertension: Secondary | ICD-10-CM

## 2019-04-10 DIAGNOSIS — Z20828 Contact with and (suspected) exposure to other viral communicable diseases: Secondary | ICD-10-CM | POA: Diagnosis present

## 2019-04-10 DIAGNOSIS — Z7902 Long term (current) use of antithrombotics/antiplatelets: Secondary | ICD-10-CM | POA: Diagnosis not present

## 2019-04-10 DIAGNOSIS — N1831 Chronic kidney disease, stage 3a: Secondary | ICD-10-CM | POA: Diagnosis present

## 2019-04-10 DIAGNOSIS — Z87891 Personal history of nicotine dependence: Secondary | ICD-10-CM

## 2019-04-10 DIAGNOSIS — H9193 Unspecified hearing loss, bilateral: Secondary | ICD-10-CM

## 2019-04-10 DIAGNOSIS — G8191 Hemiplegia, unspecified affecting right dominant side: Secondary | ICD-10-CM | POA: Diagnosis present

## 2019-04-10 DIAGNOSIS — I6389 Other cerebral infarction: Secondary | ICD-10-CM | POA: Diagnosis not present

## 2019-04-10 DIAGNOSIS — N136 Pyonephrosis: Secondary | ICD-10-CM | POA: Diagnosis present

## 2019-04-10 DIAGNOSIS — I639 Cerebral infarction, unspecified: Secondary | ICD-10-CM | POA: Diagnosis present

## 2019-04-10 DIAGNOSIS — N138 Other obstructive and reflux uropathy: Secondary | ICD-10-CM | POA: Diagnosis present

## 2019-04-10 DIAGNOSIS — I63312 Cerebral infarction due to thrombosis of left middle cerebral artery: Secondary | ICD-10-CM | POA: Diagnosis not present

## 2019-04-10 DIAGNOSIS — N179 Acute kidney failure, unspecified: Secondary | ICD-10-CM | POA: Diagnosis present

## 2019-04-10 DIAGNOSIS — B957 Other staphylococcus as the cause of diseases classified elsewhere: Secondary | ICD-10-CM | POA: Diagnosis present

## 2019-04-10 DIAGNOSIS — I6521 Occlusion and stenosis of right carotid artery: Secondary | ICD-10-CM | POA: Diagnosis present

## 2019-04-10 DIAGNOSIS — N390 Urinary tract infection, site not specified: Secondary | ICD-10-CM

## 2019-04-10 DIAGNOSIS — R630 Anorexia: Secondary | ICD-10-CM | POA: Diagnosis present

## 2019-04-10 DIAGNOSIS — H532 Diplopia: Secondary | ICD-10-CM | POA: Diagnosis present

## 2019-04-10 DIAGNOSIS — R479 Unspecified speech disturbances: Secondary | ICD-10-CM | POA: Diagnosis present

## 2019-04-10 DIAGNOSIS — Z87442 Personal history of urinary calculi: Secondary | ICD-10-CM | POA: Diagnosis not present

## 2019-04-10 DIAGNOSIS — I219 Acute myocardial infarction, unspecified: Secondary | ICD-10-CM | POA: Diagnosis not present

## 2019-04-10 LAB — URINALYSIS, COMPLETE (UACMP) WITH MICROSCOPIC
Bilirubin Urine: NEGATIVE
Glucose, UA: NEGATIVE mg/dL
Ketones, ur: NEGATIVE mg/dL
Nitrite: POSITIVE — AB
Protein, ur: NEGATIVE mg/dL
Specific Gravity, Urine: 1.014 (ref 1.005–1.030)
Squamous Epithelial / HPF: NONE SEEN (ref 0–5)
WBC, UA: >50 WBC/hpf — ABNORMAL HIGH (ref 0–5)
pH: 6 (ref 5.0–8.0)

## 2019-04-10 LAB — CBC
HCT: 43.2 % (ref 39.0–52.0)
Hemoglobin: 14.2 g/dL (ref 13.0–17.0)
MCH: 30.4 pg (ref 26.0–34.0)
MCHC: 32.9 g/dL (ref 30.0–36.0)
MCV: 92.5 fL (ref 80.0–100.0)
Platelets: 244 10*3/uL (ref 150–400)
RBC: 4.67 MIL/uL (ref 4.22–5.81)
RDW: 13 % (ref 11.5–15.5)
WBC: 8.8 10*3/uL (ref 4.0–10.5)
nRBC: 0 % (ref 0.0–0.2)

## 2019-04-10 LAB — COMPREHENSIVE METABOLIC PANEL
ALT: 18 U/L (ref 0–44)
AST: 22 U/L (ref 15–41)
Albumin: 3.8 g/dL (ref 3.5–5.0)
Alkaline Phosphatase: 80 U/L (ref 38–126)
Anion gap: 11 (ref 5–15)
BUN: 21 mg/dL (ref 8–23)
CO2: 23 mmol/L (ref 22–32)
Calcium: 9.4 mg/dL (ref 8.9–10.3)
Chloride: 107 mmol/L (ref 98–111)
Creatinine, Ser: 1.26 mg/dL — ABNORMAL HIGH (ref 0.61–1.24)
GFR calc Af Amer: 57 mL/min — ABNORMAL LOW (ref >60)
GFR calc non Af Amer: 50 mL/min — ABNORMAL LOW (ref >60)
Glucose, Bld: 131 mg/dL — ABNORMAL HIGH (ref 70–99)
Potassium: 3.5 mmol/L (ref 3.5–5.1)
Sodium: 141 mmol/L (ref 135–145)
Total Bilirubin: 1 mg/dL (ref 0.3–1.2)
Total Protein: 6.5 g/dL (ref 6.5–8.1)

## 2019-04-10 LAB — PROTIME-INR
INR: 1 (ref 0.8–1.2)
Prothrombin Time: 13 seconds (ref 11.4–15.2)

## 2019-04-10 MED ORDER — TRAZODONE HCL 50 MG PO TABS: 25.0000 mg | ORAL_TABLET | Freq: Every evening | ORAL | Status: DC | PRN

## 2019-04-10 MED ORDER — ONDANSETRON HCL 4 MG PO TABS: 4.0000 mg | ORAL_TABLET | Freq: Four times a day (QID) | ORAL | Status: DC | PRN

## 2019-04-10 MED ORDER — ONDANSETRON HCL 4 MG/2ML IJ SOLN
4.0000 mg | Freq: Four times a day (QID) | INTRAMUSCULAR | Status: DC | PRN
  Administered 2019-04-11: 11:00:00 4 mg via INTRAVENOUS

## 2019-04-10 MED ORDER — STROKE: EARLY STAGES OF RECOVERY BOOK
Freq: Once | Status: AC
  Administered 2019-04-11: 02:00:00

## 2019-04-10 MED ORDER — METOPROLOL TARTRATE 50 MG PO TABS
50.0000 mg | ORAL_TABLET | Freq: Two times a day (BID) | ORAL | Status: DC
  Administered 2019-04-10 – 2019-04-13 (×4): 50 mg via ORAL
  Filled 2019-04-10 (×5): qty 1

## 2019-04-10 MED ORDER — SODIUM CHLORIDE 0.9 % IV SOLN
1.0000 g | Freq: Once | INTRAVENOUS | Status: AC
  Administered 2019-04-10: 1 g via INTRAVENOUS
  Filled 2019-04-10: qty 10

## 2019-04-10 MED ORDER — MAGNESIUM HYDROXIDE 400 MG/5ML PO SUSP
30.0000 mL | Freq: Every day | ORAL | Status: DC | PRN
  Filled 2019-04-10: qty 30

## 2019-04-10 MED ORDER — ATORVASTATIN CALCIUM 10 MG PO TABS
10.0000 mg | ORAL_TABLET | Freq: Every evening | ORAL | Status: DC
  Administered 2019-04-10 – 2019-04-13 (×4): 10 mg via ORAL
  Filled 2019-04-10 (×4): qty 1

## 2019-04-10 MED ORDER — IOHEXOL 300 MG/ML  SOLN
75.0000 mL | Freq: Once | INTRAMUSCULAR | Status: AC | PRN
  Administered 2019-04-10: 75 mL via INTRAVENOUS

## 2019-04-10 MED ORDER — SODIUM CHLORIDE 0.9 % IV BOLUS
500.0000 mL | Freq: Once | INTRAVENOUS | Status: AC
  Administered 2019-04-10: 500 mL via INTRAVENOUS

## 2019-04-10 MED ORDER — ONDANSETRON HCL 4 MG/2ML IJ SOLN
4.0000 mg | Freq: Once | INTRAMUSCULAR | Status: AC
  Administered 2019-04-10: 4 mg via INTRAVENOUS
  Filled 2019-04-10: qty 2

## 2019-04-10 MED ORDER — ENOXAPARIN SODIUM 40 MG/0.4ML ~~LOC~~ SOLN
40.0000 mg | SUBCUTANEOUS | Status: DC
  Administered 2019-04-11 – 2019-04-12 (×3): 40 mg via SUBCUTANEOUS
  Filled 2019-04-10 (×4): qty 0.4

## 2019-04-10 MED ORDER — SODIUM CHLORIDE 0.9 % IV SOLN
1.0000 g | INTRAVENOUS | Status: DC
  Administered 2019-04-11: 1 g via INTRAVENOUS
  Filled 2019-04-10: qty 10
  Filled 2019-04-10: qty 1

## 2019-04-10 MED ORDER — ACETAMINOPHEN 325 MG PO TABS: 650.0000 mg | ORAL_TABLET | Freq: Four times a day (QID) | ORAL | Status: DC | PRN

## 2019-04-10 MED ORDER — ASPIRIN EC 81 MG PO TBEC
81.0000 mg | DELAYED_RELEASE_TABLET | Freq: Every day | ORAL | Status: DC
  Administered 2019-04-10 – 2019-04-13 (×4): 81 mg via ORAL
  Filled 2019-04-10 (×4): qty 1

## 2019-04-10 MED ORDER — SODIUM CHLORIDE 0.9 % IV SOLN
INTRAVENOUS | Status: DC
  Administered 2019-04-10 – 2019-04-12 (×4): via INTRAVENOUS

## 2019-04-10 MED ORDER — ACETAMINOPHEN 650 MG RE SUPP: 650.0000 mg | Freq: Four times a day (QID) | RECTAL | Status: DC | PRN

## 2019-04-10 NOTE — ED Provider Notes (Signed)
[facility] Emergency Department Provider Note    First MD Initiated Contact with Patient 04/10/19 1458     (approximate)  I have reviewed the triage vital signs and the nursing notes.   HISTORY  Chief Complaint Altered Mental Status  Level V Caveat:  AMS  HPI Matthew Greer is a 85 y.o. male  With the below listed past medical history presents the ER for evaluation of altered mental status nausea vomiting abdominal pain and generalized weakness.  Started feeling sick when woke up this AM.  Patient unable to provide much additional history.  Per review of medical history does have a history of stroke.  Not complaining of any pain.  Does have dried vomitus on his shirt and chin.  Denies any chest pain or shortness of breath.  Patient not providing much additional history.    Past Medical History:  Diagnosis Date  . Hypertension   . Stroke [facility](HCC) 2013   "minor" - no deficits   No family history on file. Past Surgical History:  Procedure Laterality Date  . ESOPHAGEAL DILATION  02/24/2016   Procedure: ESOPHAGEAL DILATION;  Surgeon: Midge Miniumarren Wohl, MD;  [facility];  Service: Endoscopy;;  . ESOPHAGOGASTRODUODENOSCOPY (EGD) WITH PROPOFOL N/A 02/24/2016   Procedure: ESOPHAGOGASTRODUODENOSCOPY (EGD) WITH PROPOFOL;  Surgeon: Midge Miniumarren Wohl, MD;  [facility];  Service: Endoscopy;  Laterality: N/A;  . ESOPHAGOGASTRODUODENOSCOPY ENDOSCOPY  03/2013   Dr. Servando Snarewohl  . THORACOTOMY  1953   Patient Active Problem List   Diagnosis Date Noted  . Obstructive uropathy 04/10/2019  . Acute blood loss anemia 08/15/2016  . Hip hematoma, right, initial encounter 08/15/2016  . CVA (cerebral vascular accident) (HCC) 05/25/2016  . Problems with swallowing and mastication   . Stricture and stenosis of esophagus   . Benign essential hypertension 01/27/2016  . Kidney stones 01/27/2016  . Other nonspecific abnormal finding of lung field 01/27/2016  .  Pure hypercholesterolemia 01/27/2016  . Stroke (HCC) 01/27/2016  . 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 Rotator cuff tendonitis 11/10/2013  . Hearing loss 04/09/2013  . Eczema 07/26/2012  . Abdominal pain 12/27/2011      Prior to Admission medications   Medication Sig Start Date End Date Taking? Authorizing Provider  acetaminophen (TYLENOL) 325 MG tablet Take 325-650 mg by mouth every 6 (six) hours as needed for mild pain, moderate pain, fever or headache.    Yes [provider]  atorvastatin (LIPITOR) 10 MG tablet Take 10 mg by mouth every evening.    Yes [provider]  clopidogrel (PLAVIX) 75 MG tablet Take 75 mg by mouth daily.   Yes [provider]  metoprolol (LOPRESSOR) 50 MG tablet Take 1 tablet (50 mg total) by mouth 2 (two) times daily. 05/28/16  Yes Adrian SaranMody, Sital, MD  Multiple Vitamin (MULTIVITAMIN WITH MINERALS) TABS tablet Take 1 tablet by mouth daily.   Yes [provider]  triamcinolone cream (KENALOG) 0.1 % Apply 1 application topically 2 (two) times daily.   Yes [provider]  vitamin B-12 (CYANOCOBALAMIN) 1000 MCG tablet Take 1,000 mcg by mouth daily.   Yes [provider]    Allergies Finasteride and Lisinopril    Social History Social History   Tobacco Use  . Smoking status: Former Games developermoker  . Smokeless tobacco: Never Used  Substance Use Topics  . Alcohol use: No  . Drug use: Not on file    Review of Systems Patient denies headaches, rhinorrhea,  blurry vision, numbness, shortness of breath, chest pain, edema, cough, abdominal pain, nausea, vomiting, diarrhea, dysuria, fevers, rashes or hallucinations unless otherwise stated above in HPI. ____________________________________________   PHYSICAL EXAM:  VITAL SIGNS: Vitals:   04/10/19 2000 04/10/19 2030  BP: (!) 155/82 (!) 162/76  Pulse:    Resp: 17 20  Temp:    SpO2:      Constitutional: drowsy, protecting airway, ill  appearing  Eyes: Conjunctivae are normal.  Head: Atraumatic. Nose: No congestion/rhinnorhea. Mouth/Throat: Mucous membranes are moist.   Neck: No stridor. Painless ROM.  Cardiovascular: Normal rate, regular rhythm. Grossly normal heart sounds.  Good peripheral circulation. Respiratory: Normal respiratory effort.  No retractions. Lungs CTAB. Gastrointestinal: Soft with mild diffuse ttp. No distention. No abdominal bruits. No CVA tenderness. Genitourinary:  Musculoskeletal: No lower extremity tenderness nor edema.  No joint effusions. Neurologic:  Unable to cooperate with neuro exam.  Mild right sided weakness as compared to left.  No facial droop Skin:  Skin is warm, dry and intact. No rash noted. Psychiatric: Mood and affect are normal. Speech and behavior are normal.  ____________________________________________   LABS (all labs ordered are listed, but only abnormal results are displayed)  Results for orders placed or performed during the hospital encounter of 04/10/19 (from the past 24 hour(s))  Comprehensive metabolic panel     Status: Abnormal   Collection Time: 04/10/19  3:00 PM  Result Value Ref Range   Sodium 141 135 - 145 mmol/L   Potassium 3.5 3.5 - 5.1 mmol/L   Chloride 107 98 - 111 mmol/L   CO2 23 22 - 32 mmol/L   Glucose, Bld 131 (H) 70 - 99 mg/dL   BUN 21 8 - 23 mg/dL   Creatinine, Ser 9.48 (H) 0.61 - 1.24 mg/dL   Calcium 9.4 8.9 - 54.6 mg/dL   Total Protein 6.5 6.5 - 8.1 g/dL   Albumin 3.8 3.5 - 5.0 g/dL   AST 22 15 - 41 U/L   ALT 18 0 - 44 U/L   Alkaline Phosphatase 80 38 - 126 U/L   Total Bilirubin 1.0 0.3 - 1.2 mg/dL   GFR calc non Af Amer 50 (L) >60 mL/min   GFR calc Af Amer 57 (L) >60 mL/min   Anion gap 11 5 - 15  CBC     Status: None   Collection Time: 04/10/19  3:00 PM  Result Value Ref Range   WBC 8.8 4.0 - 10.5 K/uL   RBC 4.67 4.22 - 5.81 MIL/uL   Hemoglobin 14.2 13.0 - 17.0 g/dL   HCT 27.0 35.0 - 09.3 %   MCV 92.5 80.0 - 100.0 fL   MCH 30.4  26.0 - 34.0 pg   MCHC 32.9 30.0 - 36.0 g/dL   RDW 81.8 29.9 - 37.1 %   Platelets 244 150 - 400 K/uL   nRBC 0.0 0.0 - 0.2 %  Protime-INR - (order if patient is taking Coumadin / Warfarin)     Status: None   Collection Time: 04/10/19  3:00 PM  Result Value Ref Range   Prothrombin Time 13.0 11.4 - 15.2 seconds   INR 1.0 0.8 - 1.2  Urinalysis, Complete w Microscopic     Status: Abnormal   Collection Time: 04/10/19  5:35 PM  Result Value Ref Range   Color, Urine YELLOW (A) YELLOW   APPearance HAZY (A) CLEAR   Specific Gravity, Urine 1.014 1.005 - 1.030   pH 6.0 5.0 - 8.0   Glucose, UA NEGATIVE  NEGATIVE mg/dL   Hgb urine dipstick SMALL (A) NEGATIVE   Bilirubin Urine NEGATIVE NEGATIVE   Ketones, ur NEGATIVE NEGATIVE mg/dL   Protein, ur NEGATIVE NEGATIVE mg/dL   Nitrite POSITIVE (A) NEGATIVE   Leukocytes,Ua LARGE (A) NEGATIVE   RBC / HPF 6-10 0 - 5 RBC/hpf   WBC, UA >50 (H) 0 - 5 WBC/hpf   Bacteria, UA FEW (A) NONE SEEN   Squamous Epithelial / LPF NONE SEEN 0 - 5   Mucus PRESENT    ____________________________________________  EKG My review and personal interpretation at Time: 15:10   Indication: ams  Rate: 70  Rhythm: sinus Axis: normal Other: nonspecific st abn, no stemi ____________________________________________  RADIOLOGY  I personally reviewed all radiographic images ordered to evaluate for the above acute complaints and reviewed radiology reports and findings.  These findings were personally discussed with the patient.  Please see medical record for radiology report.  ____________________________________________   PROCEDURES  Procedure(s) performed:  Procedures    Critical Care performed: no ____________________________________________   INITIAL IMPRESSION / ASSESSMENT AND PLAN / ED COURSE  Pertinent labs & imaging results that were available during my care of the patient were reviewed by me and considered in my medical decision making (see chart for  details).   DDX: Dehydration, sepsis, pna, uti, hypoglycemia, cva, drug effect, withdrawal, encephalitis   Matthew Greer is a 85 y.o. who presents to the ED with sx as described above.  Patient very poor historian.  Appears unwell.  Very broad differential.  Is complaining of some abdominal pain and possible bladder outlet obstruction given the vomiting will order CT imaging as well as CT head. The patient will be placed on continuous pulse oximetry and telemetry for monitoring.  Laboratory evaluation will be sent to evaluate for the above complaints.     Clinical Course as of Apr 10 2155  Thu Apr 10, 2019  1634 CT imaging shows evidence of probable bladder outlet obstruction with bilateral hydronephrosis.  Does have mild AKI.  May be contributing his altered mental status.  Symptoms improved after Zofran.  Will get a catheterized urine specimen and reassess.   [PR]  1818 Platelets: 244 [PR]  1822 Patient does have evidence of nitrite positive UTI.  Will consult with urology as well as order IV Rocephin.   [PR]  2004 Discussed in consultation with Dr. Aleene Davidson of urology agree to plan foradmission for IV antibiotics.  Bladder is decompressed with Foley catheter.  Will discuss with hospitalist for admission.   [PR]    Clinical Course User Index [PR] Willy Eddy, MD    The patient was evaluated in Emergency Department today for the symptoms described in the history of present illness. He/she was evaluated in the context of the global COVID-19 pandemic, which necessitated consideration that the patient might be at risk for infection with the SARS-CoV-2 virus that causes COVID-19. Institutional protocols and algorithms that pertain to the evaluation of patients at risk for COVID-19 are in a state of rapid change based on information released by regulatory bodies including the CDC and federal and state organizations. These policies and algorithms were followed during the patient's care in the  ED.  As part of my medical decision making, I reviewed the following data within the electronic MEDICAL RECORD NUMBER Nursing notes reviewed and incorporated, Labs reviewed, notes from prior ED visits and [place] Controlled Substance Database   ____________________________________________   FINAL CLINICAL IMPRESSION(S) / ED DIAGNOSES  Final diagnoses:  Altered mental  status, unspecified altered mental status type  AKI (acute kidney injury) ([place])  Acute cystitis without hematuria      NEW MEDICATIONS STARTED DURING THIS VISIT:  New Prescriptions   No medications on file     Note:  This document was prepared using Dragon voice recognition software and may include unintentional dictation errors.    Merlyn Lot, MD 04/10/19 2156

## 2019-04-10 NOTE — H&P (Addendum)
[place] [facility]   PATIENT NAME: Matthew Greer    MR#:  [id number]  DATE OF BIRTH:  [birth year]  DATE OF ADMISSION:  04/10/2019  PRIMARY CARE PHYSICIAN: Rolm Gala, MD   REQUESTING/REFERRING PHYSICIAN: Willy Eddy, MD CHIEF COMPLAINT:   Chief Complaint  Patient presents with   Altered Mental Status  The patient was seen and examined on 11/5  HISTORY OF PRESENT ILLNESS:  Matthew Greer  is a 85 y.o. Caucasian male with a known history of hypertension and CVA with residual right-sided weakness, who presented to the emergency room with acute onset of altered status with associated worsening right-sided weakness and blurred vision with diplopia.  The patient has been having generalized weakness and had nausea and vomiting with reported abdominal pain.  He was having altered mental status and was therefore a poor historian during my interview.  No reported bilious vomitus or hematemesis.  No reported fever or chills or headache or dizziness or blurred vision.  He had prostatic tenderness during rectal exam by the ER physician  Upon presentation to the emergency room, blood pressure was elevated 191/91 with otherwise normal vital signs.  Labs revealed borderline potassium of 3.5, blood glucose of 131 and unremarkable CBC.  UA was positive for UTI.  Her COVID-19 test is currently pending.  Blood cultures were drawn.  The patient was given a gram of IV Rocephin as well as 4 mg of IV with Zofran and 500 mill IV normal saline bolus.  He will be admitted to a medical monitored bed for further evaluation and management. PAST MEDICAL HISTORY:   Past Medical History:  Diagnosis Date   Hypertension    Stroke [facility]) 2013   "minor" - no deficits  Dyslipidemia  PAST SURGICAL HISTORY:   Past Surgical History:  Procedure Laterality Date   ESOPHAGEAL DILATION  02/24/2016   Procedure: ESOPHAGEAL DILATION;  Surgeon: Midge Minium, MD;  Location: [facility];  Service:  Endoscopy;;   ESOPHAGOGASTRODUODENOSCOPY (EGD) WITH PROPOFOL N/A 02/24/2016   Procedure: ESOPHAGOGASTRODUODENOSCOPY (EGD) WITH PROPOFOL;  Surgeon: Midge Minium, MD;  [facility];  Service: Endoscopy;  Laterality: N/A;   ESOPHAGOGASTRODUODENOSCOPY ENDOSCOPY  03/2013   Dr. Servando Snare   THORACOTOMY  1953    SOCIAL HISTORY:   Social History   Tobacco Use   Smoking status: Former Smoker   Smokeless tobacco: Never Used  Substance Use Topics   Alcohol use: No    FAMILY HISTORY:  No family history on file.  DRUG ALLERGIES:   Allergies  Allergen Reactions   Finasteride Nausea Only and Other (See Comments)    Reaction:  Confusion    Lisinopril Other (See Comments)    Reaction:  Confusion    REVIEW OF SYSTEMS:   ROS As per history of present illness. All pertinent systems were reviewed above. Constitutional,  HEENT, cardiovascular, respiratory, GI, GU, musculoskeletal, neuro, psychiatric, endocrine,  integumentary and hematologic systems were reviewed and are otherwise  negative/unremarkable except for positive findings mentioned above in the HPI.   MEDICATIONS AT HOME:   Prior to Admission medications   Medication Sig Start Date End Date Taking? Authorizing Provider  acetaminophen (TYLENOL) 325 MG tablet Take 325-650 mg by mouth every 6 (six) hours as needed for mild pain, moderate pain, fever or headache.    Yes [provider]  atorvastatin (LIPITOR) 10 MG tablet Take 10 mg by mouth every evening.    Yes [provider]  clopidogrel (PLAVIX) 75 MG tablet  Take 75 mg by mouth daily.   Yes [provider]  metoprolol (LOPRESSOR) 50 MG tablet Take 1 tablet (50 mg total) by mouth 2 (two) times daily. 05/28/16  Yes Adrian Saran, MD  Multiple Vitamin (MULTIVITAMIN WITH MINERALS) TABS tablet Take 1 tablet by mouth daily.   Yes [provider]  triamcinolone cream (KENALOG) 0.1 % Apply 1 application topically 2 (two) times daily.    Yes [provider]      VITAL SIGNS:  Blood pressure (!) 166/84, pulse 65, temperature (!) 97.5 F (36.4 C), temperature source Axillary, resp. rate 17, height 6\' 2"  (1.88 m), weight 72.6 kg, SpO2 97 %.  PHYSICAL EXAMINATION:  Physical Exam  GENERAL:  85 y.o.-year-old Caucasian male patient lying in the bed with no acute distress.  EYES: Pupils equal, round, reactive to light and accommodation. No scleral icterus. Extraocular muscles intact.  HEENT: Head atraumatic, normocephalic. Oropharynx and nasopharynx clear.  NECK:  Supple, no jugular venous distention. No thyroid enlargement, no tenderness.  LUNGS: Normal breath sounds bilaterally, no wheezing, rales,rhonchi or crepitation. No use of accessory muscles of respiration.  CARDIOVASCULAR: Regular rate and rhythm, S1, S2 normal. No murmurs, rubs, or gallops.  ABDOMEN: Soft, nondistended, nontender. Bowel sounds present. No organomegaly or mass.  EXTREMITIES: No pedal edema, cyanosis, or clubbing.  NEUROLOGIC: He had positive nystagmus.  No gaze preference.  No facial droop.  He has right-sided hemiplegia. PSYCHIATRIC: The patient was somnolent but arousable.  He had normal affect and mood. SKIN: No obvious rash, lesion, or ulcer.   LABORATORY PANEL:   CBC Recent Labs  Lab 04/10/19 1500  WBC 8.8  HGB 14.2  HCT 43.2  PLT 244   ------------------------------------------------------------------------------------------------------------------  Chemistries  Recent Labs  Lab 04/10/19 1500  NA 141  K 3.5  CL 107  CO2 23  GLUCOSE 131*  BUN 21  CREATININE 1.26*  CALCIUM 9.4  AST 22  ALT 18  ALKPHOS 80  BILITOT 1.0   ------------------------------------------------------------------------------------------------------------------  Cardiac Enzymes No results for input(s): TROPONINI in the last 168  hours. ------------------------------------------------------------------------------------------------------------------  RADIOLOGY:  Ct Head Wo Contrast  Result Date: 04/10/2019 CLINICAL DATA:  Cerebral hemorrhage suspected, 2 episodes of nausea vomiting, unexplained. Also with altered mental status. EXAM: CT HEAD WITHOUT CONTRAST TECHNIQUE: Contiguous axial images were obtained from the base of the skull through the vertex without intravenous contrast. COMPARISON:  05/25/2016 FINDINGS: Brain: Signs of atrophy worsened slightly since the prior study. No signs of intracranial hemorrhage, mass effect, midline shift or hydrocephalus. Signs of remote infarct in the left internal capsule and periventricular white matter. Vascular: No hyperdense vessel or unexpected calcification. Skull: Normal. Negative for fracture or focal lesion. Sinuses/Orbits: Secretions in the ethmoid sinuses and mild mucoperiosteal thickening. No fluid level. Other: None. IMPRESSION: 1. No acute intracranial abnormality. 2. Evolution of previous infarct from 2017 in the left internal capsule. 3. Sinus disease as above. Electronically Signed   By: Donzetta Kohut M.D.   On: 04/10/2019 16:13   Ct Abdomen Pelvis W Contrast  Result Date: 04/10/2019 CLINICAL DATA:  Altered level of consciousness, nausea vomiting. EXAM: CT ABDOMEN AND PELVIS WITH CONTRAST TECHNIQUE: Multidetector CT imaging of the abdomen and pelvis was performed using the standard protocol following bolus administration of intravenous contrast. CONTRAST:  23mL OMNIPAQUE IOHEXOL 300 MG/ML  SOLN COMPARISON:  August 15, 2016 FINDINGS: Lower chest: Signs of interstitial thickening. Fluid-filled bronchi. Process worse on the right, superimposed atelectasis. Small hiatal hernia. Hepatobiliary: Liver is normal. No signs  of biliary ductal dilation. Small gallstone in the neck of the gallbladder. Pancreas: Unremarkable. No pancreatic ductal dilatation or surrounding inflammatory  changes. Spleen: Normal in size without focal abnormality. Adrenals/Urinary Tract: Adrenal glands are normal. Marked fullness of the right renal pelvis compatible with right UPJ obstruction appears more pronounced than on the previous exam. There is a 9 mm calculus in the dependent aspect of the renal pelvis, away from the right UPJ. There also 2 separate collecting system calculi in infundibulum of the interpolar right kidney that are approximately 7-9 mm in greatest size, similar however compared to the study of August 15, 2016. The urinary bladder is distended and there are numerous small calculi in the bladder base. Signs of renal cortical scarring which are present bilaterally. Cortical thickness is similar to the prior study on the right despite collecting system distension. Stomach/Bowel: Moderate-size hiatal hernia. Signs of colonic diverticulosis. Normal appendix. Vascular/Lymphatic: Tortuous abdominal aorta with calcified and noncalcified plaque. No signs of abdominal or pelvic lymphadenopathy. Reproductive: Prostatomegaly. Nonspecific and filling much of the low pelvis measuring approximately 5.5 x 6 cm. Other: No signs of free air or hernia. No free fluid. Musculoskeletal: No signs of acute bone finding or evidence of destructive bone process. IMPRESSION: 1. 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 Left extrarenal pelvis as well with slight increased fullness. Correlate with any signs of urinary tract infection in this patient who may have bladder outlet obstruction due to prostatomegaly. Catheterization and follow-up renal sonogram may be helpful. 2. Potential aspiration pneumonitis developing in the right lung base, superimposed on chronic interstitial thickening. 3. Nephrolithiasis with 3 large calculi in the right renal collecting systems and renal pelvis, not contributing to increased dilation of the right renal pelvis. 4.  Cholelithiasis. 5. Atherosclerotic changes in the abdominal aorta. Aortic Atherosclerosis (ICD10-I70.0). Electronically Signed   By: Donzetta KohutGeoffrey  Wile M.D.   On: 04/10/2019 16:24   Dg Chest Portable 1 View  Result Date: 04/10/2019 CLINICAL DATA:  Pt arrives via EMS from home after having 2 at the sites of vomiting since 7 AM- pt also has AMS, pt normally able to hold conversation and is unable to now- pt blood pressure reported to be high, history of stroke, hypertension, former smoker EXAM: PORTABLE CHEST 1 VIEW COMPARISON:  03/09/2018 FINDINGS: Heart size is normal. There is new dense opacity at the LEFT lung base, consistent with significant atelectasis or consolidation. Biapical pleuroparenchymal changes are again noted. Chronic changes in both shoulders. IMPRESSION: New LEFT lower lobe infiltrate or atelectasis. Electronically Signed   By: Norva PavlovElizabeth  Brown M.D.   On: 04/10/2019 15:39      IMPRESSION AND PLAN:   1.  Acute CVA with evolving left internal capsule infarction with subsequent worsening right-sided hemiparesis and blurred vision with diplopia.The patient will be admitted to a medically monitored bed.  We will follow neuro checks q.4 hours for 24 hours.  The patient will be placed on aspirin with Plavix.  Will obtain a brain MRI without contrast as well as bilateral carotid Doppler and 2D echo with bubble study .  A neurology consultationas well as physical/occupation/speech therapy consults will be obtained in a.m..  I notified Dr. Thad Rangereynolds about the patient. The patient will be continued on statin therapy and fasting lipids will be checked.  2.  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  The patient will be placed on IV Rocephin and a urology  consultation will be obtained.  I notified Dr. Claudie Leach who is aware about the patient.  3.  Mild acute kidney injury that could be on top of stage IIIa chronic kidney disease.  He will be hydrated with IV normal saline  and will follow BMP.  4.  Hypertensive urgency.  Permissive hypertension will be allowed in the setting of evolving CVA.  As needed IV labetalol will be utilized.  5.  Dyslipidemia.  We will continue statin therapy and check fasting lipids.  6.  DVT prophylaxis.  Subcutaneous Lovenox   All the records are reviewed and case discussed with ED provider. The plan of care was discussed in details with the patient (and family). I answered all questions. The patient agreed to proceed with the above mentioned plan. Further management will depend upon hospital course.   CODE STATUS: Full code  TOTAL TIME TAKING CARE OF THIS PATIENT: 55 minutes.    Christel Mormon M.D on 04/10/2019 at 8:00 PM  [facility]   From 7 PM-7 AM, contact night-coverage [email or web address]  CC: Primary care physician; Hortencia Pilar, MD   Note: This dictation was prepared with Dragon dictation along with smaller phrase technology. Any transcriptional errors that result from this process are unintentional.

## 2019-04-10 NOTE — ED Notes (Signed)
Pt more able to answer questions- pt able to answer yes or no

## 2019-04-10 NOTE — ED Notes (Signed)
Patient transported to MRI 

## 2019-04-10 NOTE — ED Notes (Signed)
Attempted to call report at thsi time

## 2019-04-10 NOTE — ED Triage Notes (Signed)
Pt arrives via EMS from home after having 2 episdoes of vomiting since 7 AM- pt also has AMS, pt normally able to hold conversation and is unable to now- pt bp reported to be high- cbg 114 per ems

## 2019-04-10 NOTE — ED Notes (Signed)
Patient transported to CT 

## 2019-04-11 ENCOUNTER — Other Ambulatory Visit: Payer: Self-pay

## 2019-04-11 ENCOUNTER — Inpatient Hospital Stay: Payer: Medicare Other

## 2019-04-11 ENCOUNTER — Inpatient Hospital Stay (HOSPITAL_COMMUNITY)
Admit: 2019-04-11 | Discharge: 2019-04-11 | Disposition: A | Discharge status: Home / Self Care | Payer: Medicare Other | Attending: Nurse Practitioner | Admitting: Nurse Practitioner

## 2019-04-11 DIAGNOSIS — I6389 Other cerebral infarction: Secondary | ICD-10-CM

## 2019-04-11 DIAGNOSIS — N179 Acute kidney failure, unspecified: Secondary | ICD-10-CM

## 2019-04-11 DIAGNOSIS — I639 Cerebral infarction, unspecified: Principal | ICD-10-CM

## 2019-04-11 DIAGNOSIS — N139 Obstructive and reflux uropathy, unspecified: Secondary | ICD-10-CM

## 2019-04-11 DIAGNOSIS — H9193 Unspecified hearing loss, bilateral: Secondary | ICD-10-CM

## 2019-04-11 DIAGNOSIS — I219 Acute myocardial infarction, unspecified: Secondary | ICD-10-CM

## 2019-04-11 LAB — HEMOGLOBIN A1C
Hgb A1c MFr Bld: 5.3 % (ref 4.8–5.6)
Mean Plasma Glucose: 105.41 mg/dL

## 2019-04-11 LAB — CBC
HCT: 37.3 % — ABNORMAL LOW (ref 39.0–52.0)
Hemoglobin: 12.3 g/dL — ABNORMAL LOW (ref 13.0–17.0)
MCH: 30.3 pg (ref 26.0–34.0)
MCHC: 33 g/dL (ref 30.0–36.0)
MCV: 91.9 fL (ref 80.0–100.0)
Platelets: 217 10*3/uL (ref 150–400)
RBC: 4.06 MIL/uL — ABNORMAL LOW (ref 4.22–5.81)
RDW: 12.8 % (ref 11.5–15.5)
WBC: 10.7 10*3/uL — ABNORMAL HIGH (ref 4.0–10.5)
nRBC: 0 % (ref 0.0–0.2)

## 2019-04-11 LAB — BASIC METABOLIC PANEL
Anion gap: 8 (ref 5–15)
BUN: 20 mg/dL (ref 8–23)
CO2: 22 mmol/L (ref 22–32)
Calcium: 8.2 mg/dL — ABNORMAL LOW (ref 8.9–10.3)
Chloride: 110 mmol/L (ref 98–111)
Creatinine, Ser: 1.19 mg/dL (ref 0.61–1.24)
GFR calc Af Amer: >60 mL/min (ref >60)
GFR calc non Af Amer: 53 mL/min — ABNORMAL LOW (ref >60)
Glucose, Bld: 92 mg/dL (ref 70–99)
Potassium: 3.7 mmol/L (ref 3.5–5.1)
Sodium: 140 mmol/L (ref 135–145)

## 2019-04-11 LAB — LIPID PANEL
Cholesterol: 141 mg/dL (ref 0–200)
HDL: 45 mg/dL (ref >40)
LDL Cholesterol: 87 mg/dL (ref 0–99)
Total CHOL/HDL Ratio: 3.1 RATIO
Triglycerides: 45 mg/dL (ref <150)
VLDL: 9 mg/dL (ref 0–40)

## 2019-04-11 LAB — SARS CORONAVIRUS 2 (TAT 6-24 HRS): SARS Coronavirus 2: NEGATIVE

## 2019-04-11 MED ORDER — CLOPIDOGREL BISULFATE 75 MG PO TABS
75.0000 mg | ORAL_TABLET | Freq: Every day | ORAL | Status: DC
  Administered 2019-04-11 – 2019-04-13 (×3): 75 mg via ORAL
  Filled 2019-04-11 (×3): qty 1

## 2019-04-11 MED ORDER — CHLORHEXIDINE GLUCONATE CLOTH 2 % EX PADS
6.0000 | MEDICATED_PAD | Freq: Every day | CUTANEOUS | Status: DC
  Administered 2019-04-11 – 2019-04-12 (×2): 6 via TOPICAL

## 2019-04-11 MED ORDER — POTASSIUM CHLORIDE 20 MEQ PO PACK: 40.0000 meq | PACK | Freq: Once | ORAL | Status: DC

## 2019-04-11 MED ORDER — POTASSIUM CHLORIDE 10 MEQ/100ML IV SOLN
10.0000 meq | INTRAVENOUS | Status: AC
  Administered 2019-04-11 (×2): 10 meq via INTRAVENOUS
  Filled 2019-04-11: qty 100

## 2019-04-11 MED ORDER — LABETALOL HCL 5 MG/ML IV SOLN: 20.0000 mg | INTRAVENOUS | Status: DC | PRN

## 2019-04-11 NOTE — Evaluation (Signed)
Clinical/Bedside Swallow Evaluation Patient Details  Name: Matthew Greer MRN: [id number] Date of Birth: 10-14-1925  Today's Date: 04/11/2019 Time: SLP Start Time (ACUTE ONLY): 0950 SLP Stop Time (ACUTE ONLY): 1050 SLP Time Calculation (min) (ACUTE ONLY): 60 min  Past Medical History:  Past Medical History:  Diagnosis Date  . Hypertension   . Stroke [facility]) 2013   "minor" - no deficits   Past Surgical History:  Past Surgical History:  Procedure Laterality Date  . ESOPHAGEAL DILATION  02/24/2016   Procedure: ESOPHAGEAL DILATION;  Surgeon: Midge Minium, MD;  Location: [facility];  Service: Endoscopy;;  . ESOPHAGOGASTRODUODENOSCOPY (EGD) WITH PROPOFOL N/A 02/24/2016   Procedure: ESOPHAGOGASTRODUODENOSCOPY (EGD) WITH PROPOFOL;  Surgeon: Midge Minium, MD;  Location: [facility];  Service: Endoscopy;  Laterality: N/A;  . ESOPHAGOGASTRODUODENOSCOPY ENDOSCOPY  03/2013   Dr. Servando Snare  . THORACOTOMY  1953   HPI:  Pt is a 85 y.o male who was admitted to [facility] with worsening right sided weakness, and visual changes with bluriness, and diplopia. Pt is very HOH and requires large print text for communication. PMHx includes: hypertension and CVA ~2 yrs ago with residual Right sided weakness, min dysarthria and dysphagia then. Current Imaging revealed an Acute pontine infarct. Pt presents also with AMS, Obstructive uropathy, AKI, HTN, and HLD. His RUE is contracted-appearing. He is able to adequately read the large print information given.    Assessment / Plan / Recommendation Clinical Impression  Pt appeared to present w/ oropharyngeal phase dysphagia -- moreso pharyngeal phase dysphagia w/ overt s/s of aspiration w/ trials of thin liquids via Cup. Suspect a delay in pharyngeal swallow initiation. Risk for aspiration and pulmonary impact are reduced w/ a modified diet and aspiration precautions. During trials of ice chips, thin liquids, purees, and broken down solids, overt coughing was noted w/  trials of thin liquids; no overt coughign or other s/s of aspiration noted w/ trials of Nectar consistency liquids, purees/broken down solids. Clear vocal quality and no decline in respiratory effort noted during these trial consistencies. During the oral phase, pt exhibited min increased oral phase time for bolus management and control w/ the increased textured trials -- he required increased time for bolus manipulation during mastication then A-P transfer for swallow. Suspect decreased lingual coordination/strength. Also noted suspected decreased lingual strength/coordination/timing during connected speech -- pt does have baseline oral weakness s/p Previous CVA (unsure of degree). Pt described being "taught to do sweeping my tongue around my mouth when I eat" s/p previous CVA. Mild+ Right OM weakness noted during exam - especially during connected movements. Will f/u w/ further Dysarthria evaluation. Pt was able to feed self given full setup support d/t weakness of RUE.  Recommend a Dysphagia level 2 (minced foods) w/ gravies; Nectar consistency liquids. Aspiration precautions; Pills in Puree for safer swallowing. Tray setup at all meals.  SLP Visit Diagnosis: Dysphagia, oropharyngeal phase (R13.12)(previous deficits)    Aspiration Risk  Mild aspiration risk;Moderate aspiration risk;Risk for inadequate nutrition/hydration    Diet Recommendation  Dysphagia level 2(Minced foods w/ gravies) w/ NECTAR consistency liquids; aspiration precautions; tray setup and support at meals d/t RUE weakness, vision deficits  Medication Administration: Whole meds with puree(for safer swallowing)    Other  Recommendations Recommended Consults: (Dietician f/u; Palliative care f/u for GOC) Oral Care Recommendations: Oral care BID;Staff/trained caregiver to provide oral care Other Recommendations: Order thickener from pharmacy;Prohibited food (jello, ice cream, thin soups);Remove water pitcher;Have oral suction available    Follow up Recommendations Skilled  Nursing facility(TBD)      Frequency and Duration min 3x week  2 weeks       Prognosis Prognosis for Safe Diet Advancement: Fair Barriers to Reach Goals: Time post onset;Severity of deficits      Swallow Study   General Date of Onset: 04/10/19 HPI: Pt is a 85 y.o male who was admitted to [facility] with worsening right sided weakness, and visual changes with bluriness, and diplopia. Pt is very HOH and requires large print text for communication. PMHx includes: hypertension and CVA ~2 yrs ago with residual Right sided weakness, min dysarthria and dysphagia then. Current Imaging revealed an Acute pontine infarct. Pt presents also with AMS, Obstructive uropathy, AKI, HTN, and HLD. His RUE is contracted-appearing. He is able to adequately read the large print information given.  Type of Study: Bedside Swallow Evaluation Previous Swallow Assessment: at time of previous CVA ~1-2 yrs ago per pt report Diet Prior to this Study: NPO(he ate a regular diet at home w/ thin liquids per pt) Temperature Spikes Noted: No(wbc 10.7) Respiratory Status: Room air History of Recent Intubation: No Behavior/Cognition: Alert;Cooperative;Pleasant mood(vision and hearing deficits) Oral Cavity Assessment: Dry(min) Oral Care Completed by SLP: Yes Oral Cavity - Dentition: Missing dentition Vision: Impaired for self-feeding Self-Feeding Abilities: Able to feed self;Needs assist;Needs set up;Total assist(vision deficits; RUE weakness) Patient Positioning: Upright in bed(needed positioning) Baseline Vocal Quality: Normal(min+ dysarthria) Volitional Cough: Strong Volitional Swallow: Able to elicit    Oral/Motor/Sensory Function Overall Oral Motor/Sensory Function: Mild impairment(unsure what's baseline though) Facial ROM: Reduced right(slight) Facial Symmetry: Abnormal symmetry right(slight) Facial Strength: Reduced right(slight) Facial Sensation: Reduced right(slight) Lingual  ROM: Reduced right(slight) Lingual Symmetry: Abnormal symmetry right(min) Lingual Strength: Reduced Velum: Within Functional Limits Mandible: Within Functional Limits   Ice Chips Ice chips: Within functional limits Presentation: Spoon(fed; 6 trials)   Thin Liquid Thin Liquid: Impaired Presentation: Cup;Self Fed(supported; 3 trials) Oral Phase Impairments: (none) Oral Phase Functional Implications: (none) Pharyngeal  Phase Impairments: Cough - Immediate;Cough - Delayed;Suspected delayed Swallow;Multiple swallows(x2/3 trials)    Nectar Thick Nectar Thick Liquid: Within functional limits Presentation: Cup;Self Fed(~3 ozs total)   Honey Thick Honey Thick Liquid: Not tested   Puree Puree: Within functional limits Presentation: Spoon(fed; 8 trials)   Solid     Solid: Impaired(mech soft foods more broken down) Presentation: Spoon(fed; 3 trials) Oral Phase Impairments: Reduced lingual movement/coordination;Impaired mastication(min) Oral Phase Functional Implications: Impaired mastication;Prolonged oral transit(min) Pharyngeal Phase Impairments: (none)       Orinda Kenner, MS, CCC-SLP Watson,Katherine 04/11/2019,2:59 PM

## 2019-04-11 NOTE — Evaluation (Signed)
Physical Therapy Evaluation Patient Details Name: Matthew Greer MRN: [id number] DOB: 1926-03-06 Today's Date: 04/11/2019   History of Present Illness  Per MD H&P:  Pt is a 85 y.o. Caucasian male with a known history of hypertension and CVA with residual right-sided weakness, who presented to the emergency room with acute onset of altered status with associated worsening right-sided weakness and blurred vision with diplopia.  The patient has been having generalized weakness and had nausea and vomiting with reported abdominal pain.  He was having altered mental status and was therefore a poor historian during my interview.  No reported bilious vomitus or hematemesis.  No reported fever or chills or headache or dizziness or blurred vision.  MD assessment includes: Acute pontine infarct, Obstructive uropathy, AKI, HTN, and HLD.    Clinical Impression  Pt presented with deficits in strength, transfers, mobility, gait, balance, and activity tolerance.  Unable to perform standard neuro exam secondary to pt unable to follow commands or provide history.  Pt would occasionally respond to yes/no questions only. With extensive cuing pt was able to perform sup to/from sit with min A.  Pt presented with occasional posterior LOB in sitting requiring min A to correct.  Some volitional movement noted to the RLE during functional tasks but none noted to the RUE.  Pt would not perform RLE movement on command however.  Attempted to get pt to stand at the EOB with +2 assist ready but pt was unable to do so.  Pt will benefit from PT services in a SNF setting upon discharge to safely address above deficits for decreased caregiver assistance and eventual return to PLOF.      Follow Up Recommendations SNF    Equipment Recommendations  Other (comment)(TBD at next venue of care)    Recommendations for Other Services       Precautions / Restrictions Precautions Precautions: Fall Restrictions Weight Bearing  Restrictions: No      Mobility  Bed Mobility Overal bed mobility: Needs Assistance Bed Mobility: Supine to Sit;Sit to Supine     Supine to sit: Min assist Sit to supine: Min assist   General bed mobility comments: Min A for BLE and trunk control and positioning  Transfers                 General transfer comment: Attempted sit to/from stand transfer with pt stating he was unable to do so  Ambulation/Gait             General Gait Details: Catering manager    Modified Rankin (Stroke Patients Only)       Balance Overall balance assessment: Needs assistance Sitting-balance support: Feet supported;Single extremity supported Sitting balance-Leahy Scale: Poor Sitting balance - Comments: Occasional min A to prevent posterior LOB in sitting Postural control: Posterior lean                                   Pertinent Vitals/Pain Pain Assessment: No/denies pain    Home Living Family/patient expects to be discharged to:: Private residence Living Arrangements: Spouse/significant other Available Help at Discharge: Family;Available 24 hours/day Type of Home: House Home Access: Ramped entrance     Home Layout: One level Home Equipment: Wheelchair - manual;Cane - single point;Walker - standard Additional Comments: History provided by pt's spouse secondary to pt unable    Prior Function  Level of Independence: Needs assistance   Gait / Transfers Assistance Needed: SBA with amb with a SW limited community distances, min A with transfers and bed mobility, no fall history  ADL's / Homemaking Assistance Needed: Min A with ADLs including sponge baths        Hand Dominance   Dominant Hand: Right    Extremity/Trunk Assessment   Upper Extremity Assessment Upper Extremity Assessment: Difficult to assess due to impaired cognition;Generalized weakness;RUE deficits/detail RUE Deficits / Details: No active movement  of the RUE noted with R wrist and fingers in flexed position            Communication   Communication: HOH  Cognition Arousal/Alertness: Lethargic Behavior During Therapy: Flat affect Overall Cognitive Status: Difficult to assess                                        General Comments      Exercises Other Exercises Other Exercises: Static and dynamic sitting at EOB for sitting balance and core strengthening x 10 min with occasional min A for stability   Assessment/Plan    PT Assessment Patient needs continued PT services  PT Problem List Decreased strength;Decreased activity tolerance;Decreased balance;Decreased mobility       PT Treatment Interventions DME instruction;Gait training;Functional mobility training;Therapeutic activities;Therapeutic exercise;Balance training;Neuromuscular re-education;Patient/family education    PT Goals (Current goals can be found in the Care Plan section)  Acute Rehab PT Goals Patient Stated Goal: To get stronger and back to PLOF PT Goal Formulation: With family Time For Goal Achievement: 04/24/19 Potential to Achieve Goals: Fair    Frequency 7X/week   Barriers to discharge        Co-evaluation               AM-PAC PT "6 Clicks" Mobility  Outcome Measure Help needed turning from your back to your side while in a flat bed without using bedrails?: A Little Help needed moving from lying on your back to sitting on the side of a flat bed without using bedrails?: A Little Help needed moving to and from a bed to a chair (including a wheelchair)?: A Lot Help needed standing up from a chair using your arms (e.g., wheelchair or bedside chair)?: A Lot Help needed to walk in hospital room?: Total Help needed climbing 3-5 steps with a railing? : Total 6 Click Score: 12    End of Session   Activity Tolerance: Patient tolerated treatment well Patient left: in bed;with family/visitor present;with call bell/phone within  reach;Other (comment)(OK for bed alarm to be off per nursing so spouse can sit at EOB with pt; spouse educated by nursing to alert staff if he leaves so alarm can be activated) Nurse Communication: Mobility status PT Visit Diagnosis: Hemiplegia and hemiparesis;Difficulty in walking, not elsewhere classified (R26.2);Muscle weakness (generalized) (M62.81) Hemiplegia - Right/Left: Right Hemiplegia - dominant/non-dominant: Dominant Hemiplegia - caused by: Cerebral infarction    Time: 4128-7867 PT Time Calculation (min) (ACUTE ONLY): 34 min   Charges:   PT Evaluation $PT Eval Moderate Complexity: 1 Mod PT Treatments $Therapeutic Exercise: 8-22 mins        D. Elly Modena PT, DPT 04/11/19, 12:29 PM

## 2019-04-11 NOTE — Evaluation (Signed)
Occupational Therapy Evaluation Patient Details Name: Matthew Greer MRN: [id number] DOB: June 02, 1926 Today's Date: 04/11/2019    History of Present Illness Pt is a 85 y.o male who was admitted to [facility] with worsening right sided weakness, and visual changes with bluriness, and diplopia. Pt. is very HOH and requires large print text for communication. PMHx includes: hypertension and CVA with residual right sided weakness. Imaging revealed an Acute pontine infarct. Pt. presents also with AMS, Obstructive uropathy, AKI, HTN, and HLD.   Clinical Impression   Pt. presents with RUE flexor tone, and tightness limiting AROM, and PROM, weakness, diplopia, and visual impairments, limited cognition, limited activity tolerance, limited functional mobility which hinder his ability to complete basic ADL and IADL functioning. Pt. Resides at home with his spouse. Pt. Required assist with ADL, and IADL tasks. Pt. Reports having hired caregivers 5x's a week who assist with morning ADLs. Pt. required assist with meal preparation, medication management, and all home management tasks. Pt. is very HOH, and was able to read large print. Pt. performed PROM in all joint ranges of the RUE, and hand. Pt. Reports that he has a right hand splint. Pt. education was provided about self-ROM using the LUE to assist, positioning. Pt. could benefit from OT services for ADL training, A/E training, RUE neuromuscular re-education, visual compensatory strategies, and pt. education about home modification, and DME. Pt. would benefit from SNF level of care upon discharge with follow-up OT services.  Follow Up Recommendations  SNF    Equipment Recommendations       Recommendations for Other Services       Precautions / Restrictions Precautions Precautions: Fall Restrictions Weight Bearing Restrictions: No      Mobility Bed Mobility Overal bed mobility: Needs Assistance Bed Mobility: Supine to Sit;Sit to Supine     Supine to  sit: Min assist Sit to supine: Min assist   General bed mobility comments: Deferred  Transfers                 General transfer comment: Deferred    Balance Overall balance assessment: Needs assistance Sitting-balance support: Feet supported;Single extremity supported Sitting balance-Leahy Scale: Poor Sitting balance - Comments: Occasional min A to prevent posterior LOB in sitting Postural control: Posterior lean                                 ADL either performed or assessed with clinical judgement   ADL Overall ADL's : Needs assistance/impaired Eating/Feeding: NPO   Grooming: Maximal assistance   Upper Body Bathing: Total assistance   Lower Body Bathing: Total assistance   Upper Body Dressing : Total assistance   Lower Body Dressing: Total assistance   Toilet Transfer: Total assistance                   Vision Baseline Vision/History: (Viaion changes, right eye blurriness, Left eye: pt. reports images constantly moving. Pt. reports diplopia) Patient Visual Report: Diplopia Vision Assessment?: Vision impaired- to be further tested in functional context     Perception     Praxis      Pertinent Vitals/Pain Pain Assessment: No/denies pain     Hand Dominance Right   Extremity/Trunk Assessment Upper Extremity Assessment Upper Extremity Assessment: Difficult to assess due to impaired cognition;Generalized weakness;RUE deficits/detail RUE Deficits / Details: No active volitional movement in the RUE, Limited PROM secondary to increased flexor tightness throughout RUE Sensation: (difficult to assess)  RUE Coordination: decreased fine motor;decreased gross motor           Communication Communication Communication: HOH   Cognition Arousal/Alertness: Lethargic Behavior During Therapy: Flat affect Overall Cognitive Status: Difficult to assess                                     General Comments       Exercises  Other Exercises   Shoulder Instructions      Home Living Family/patient expects to be discharged to:: Private residence Living Arrangements: Spouse/significant other Available Help at Discharge: Personal care attendant(5 days a week M-F) Type of Home: House Home Access: Ramped entrance     Home Layout: One level     Bathroom Shower/Tub: Producer, television/film/video: Handicapped height     Home Equipment: Environmental consultant - 2 wheels;Wheelchair - manual;Cane - single point   Additional Comments: History provided by pt's spouse secondary to pt unable      Prior Functioning/Environment Level of Independence: Needs assistance  Gait / Transfers Assistance Needed: SBA with amb with a SW limited community distances, min A with transfers and bed mobility, no fall history ADL's / Homemaking Assistance Needed: Pt. reports having person caregivers who assist with ADLs 5x's week.            OT Problem List: Decreased strength      OT Treatment/Interventions: Self-care/ADL training;Therapeutic exercise;Patient/family education;DME and/or AE instruction;Neuromuscular education;Therapeutic activities;Manual therapy    OT Goals(Current goals can be found in the care plan section) Acute Rehab OT Goals Patient Stated Goal: To regain independence OT Goal Formulation: With patient Time For Goal Achievement: 04/11/19 Potential to Achieve Goals: Good  OT Frequency: Min 2X/week   Barriers to D/C:            Co-evaluation              AM-PAC OT "6 Clicks" Daily Activity     Outcome Measure Help from another person eating meals?: Total(NPO) Help from another person taking care of personal grooming?: A Lot Help from another person toileting, which includes using toliet, bedpan, or urinal?: Total Help from another person bathing (including washing, rinsing, drying)?: Total Help from another person to put on and taking off regular upper body clothing?: Total Help from another person to  put on and taking off regular lower body clothing?: Total 6 Click Score: 7   End of Session Equipment Utilized During Treatment: Gait belt  Activity Tolerance: Patient tolerated treatment well Patient left: in bed  OT Visit Diagnosis: Muscle weakness (generalized) (M62.81)                Time: 9379-0240 OT Time Calculation (min): 30 min Charges:  OT General Charges $OT Visit: 1 Visit OT Evaluation $OT Eval Moderate Complexity: 1 Mod  Olegario Messier, MS, OTR/L   Olegario Messier 04/11/2019, 1:12 PM

## 2019-04-11 NOTE — Progress Notes (Signed)
*  PRELIMINARY RESULTS* Echocardiogram 2D Echocardiogram has been performed.  Matthew Greer 04/11/2019, 1:31 PM

## 2019-04-11 NOTE — TOC Initial Note (Signed)
Transition of Care [facility]) - Initial/Assessment Note    Patient Details  Name: Matthew Greer MRN: [id number] Date of Birth: 1925-09-30  Transition of Care [facility]) CM/SW Contact:    Allayne Butcher, RN Phone Number: 04/11/2019, 5:00 PM  Clinical Narrative:                 Patient is from home with his significant other, [place].  Patient admitted with stroke and urinary obstruction.  RNCM went by to speak with patient but patient was sleeping, significant other not present at the time.  TOC team will reach out at a later time.   PT and OT have recommended SNF- SNF workup started.   Expected Discharge Plan: Skilled Nursing Facility Barriers to Discharge: Continued Medical Work up   Patient Goals and CMS Choice        Expected Discharge Plan and Services Expected Discharge Plan: Skilled Nursing Facility   Discharge Planning Services: CM Consult                                          Prior Living Arrangements/Services   Lives with:: Significant Other Patient language and need for interpreter reviewed:: Yes        Need for Family Participation in Patient Care: Yes (Comment)(stroke) Care giver support system in place?: Yes (comment)(significant other)   Criminal Activity/Legal Involvement Pertinent to Current Situation/Hospitalization: No - Comment as needed  Activities of Daily Living Home Assistive Devices/Equipment: Wheelchair ADL Screening (condition at time of admission) Patient's cognitive ability adequate to safely complete daily activities?: Yes Is the patient deaf or have difficulty hearing?: Yes Does the patient have difficulty seeing, even when wearing glasses/contacts?: No Does the patient have difficulty concentrating, remembering, or making decisions?: No Patient able to express need for assistance with ADLs?: Yes Does the patient have difficulty dressing or bathing?: Yes Independently performs ADLs?: No Communication: Independent Dressing (OT):  Dependent Is this a change from baseline?: Pre-admission baseline Grooming: Dependent Is this a change from baseline?: Pre-admission baseline Feeding: Dependent Is this a change from baseline?: Pre-admission baseline Bathing: Dependent Is this a change from baseline?: Pre-admission baseline Toileting: Dependent Is this a change from baseline?: Pre-admission baseline In/Out Bed: Dependent Is this a change from baseline?: Pre-admission baseline Walks in Home: Dependent Is this a change from baseline?: Pre-admission baseline Does the patient have difficulty walking or climbing stairs?: Yes Weakness of Legs: Both Weakness of Arms/Hands: Right  Permission Sought/Granted                  Emotional Assessment Appearance:: Appears stated age     Orientation: : Oriented to Self, Oriented to Place Alcohol / Substance Use: Not Applicable Psych Involvement: No (comment)  Admission diagnosis:  CVA (cerebral vascular accident) (HCC) [I63.9] Acute cystitis without hematuria [N30.00] AKI (acute kidney injury) (HCC) [N17.9] Altered mental status, unspecified altered mental status type [R41.82] Acute CVA (cerebrovascular accident) [facility]) [I63.9] Patient Active Problem List   Diagnosis Date Noted  . AKI (acute kidney injury) (HCC)   . Hearing impaired person, bilateral   . Obstructive uropathy 04/10/2019  . Acute CVA (cerebrovascular accident) (HCC) 04/10/2019  . Acute blood loss anemia 08/15/2016  . Hip hematoma, right, initial encounter 08/15/2016  . CVA (cerebral vascular accident) (HCC) 05/25/2016  . Problems with swallowing and mastication   . Stricture and stenosis of esophagus   . Benign  essential hypertension 01/27/2016  . Kidney stones 01/27/2016  . Other nonspecific abnormal finding of lung field 01/27/2016  . Pure hypercholesterolemia 01/27/2016  . Stroke ([place]) 01/27/2016  . 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 Rotator cuff tendonitis 11/10/2013   . Hearing loss 04/09/2013  . Eczema 07/26/2012  . Abdominal pain 12/27/2011   PCP:  Hortencia Pilar, MD Pharmacy:   [facility] [place], [place] - [place] [facility] [address] AT [address] [facility] [place] [address] Phone: [phone] Fax: [phone]     Social Determinants of Health (SDOH) Interventions    Readmission Risk Interventions No flowsheet data found.

## 2019-04-11 NOTE — Progress Notes (Addendum)
Subjective: Patient initially admitted to [facility], however MRI brain previously ordered by admitting provider shows acute pontine infarct. Per nursing staff patient needs to be monitored to the stroke unit.  Objective: See detailed physical exam per original HPI  Assessment: 85 y.o male with past medical history of HTN, CVA,obstructive uropathy, kidney stones, DDD and stricture and stenosis of esophagus presenting with  Altered mental status, speech difficulty, abdominal pain associated with nausea and vomiting.  Plan:  1. Acute pontine infarct - Patient presenting with altered mental status and speech difficulty - Transfer to stroke unit - CT head reviewed and shows no acute intracranial abnormality - MRI of the brain shows acute pontine infarct - Check HgbA1c, fasting lipid panel - PT consult, OT consult, Speech consult - Echocardiogram with bubble study - Patient was on Plavix 75 mg prior to this event.  Aspirin 81 mg added - NPO until RN stroke swallow screen -Telemetry monitoring - Frequent neuro checks - Neurology consult placed to Dr. Doy Mince by admitting  2. Obstructive uropathy - UA with evidence of UTI - Check Urine Cultures - Start empiric with Ceftriaxone - Urology consult placed by admitting  3. Acute kidney Injury - Cr slightly elevated - Postrenal in the setting of obstructive uropathy - Hold nephrotoxic - IVFs hydration - Continue to monitor renal function  4. HTN  - Liberal BP control in the setting of stroke + Goal 140/90 - Continue Metoprolol  5. HLD  + Goal LDL<100 - Atorvastatin 10mg  PO qhs  6. DVT prophylaxis - Enoxaparin SubQ      Rufina Falco, DNP, CCRN, FNP-C [facility] Nurse Practitioner Between 7am to 6pm - Pager [phone]  After 6pm go to [email or web address] - Proofreader  [facility] [employer]  [phone]

## 2019-04-11 NOTE — Consult Note (Signed)
Urology Consult  I have been asked to see the patient by Dr. Posey Pronto, for evaluation and management of chronic UPJ obstruction, urinary retention, and UTI.  Chief Complaint: AMS, right-sided weakness, abdominal pain, nausea, vomiting  History of Present Illness: Matthew Greer is a 85 y.o. year old male who presented to the ED yesterday with altered mental status, right-sided weakness, vision changes, weakness, nausea and vomiting, and abdominal pain.  MRI brain revealed acute pontine infarct. CT with significant right hydronephrosis consistent with right UPJ obstruction with distended urinary bladder and prostatomegaly.  UA significant for 6-10 RBCs/hpf, >50 WBCs/hpf, and few bacteria.  He was admitted for management of these.  Patient with chronic UPJ obstruction, seen by [facility] in 2018.  Creatinine 1,19 today, down from 1.26 yesterday. WBC count 10.7 today, up from 8.8 yesterday. 1549mL urinary output yesterday.  Chart review reveals an episode of acute cystitis with hematuria in October 2019 but no evidence of recurrent UTI.  Patient interview challenging today, as he is hard of hearing without his hearing aid.  Additionally, he reports significant diplopia associated with his current admission and is struggling to read written messages.  He denies recent urinary symptoms including difficulty urinating and dysuria, however it is unclear if he is a reliable historian.  Past Medical History:  Diagnosis Date  . Hypertension   . Stroke [facility]) 2013   "minor" - no deficits    Past Surgical History:  Procedure Laterality Date  . ESOPHAGEAL DILATION  02/24/2016   Procedure: ESOPHAGEAL DILATION;  Surgeon: Lucilla Lame, MD;  Location: [place];  Service: Endoscopy;;  . ESOPHAGOGASTRODUODENOSCOPY (EGD) WITH PROPOFOL N/A 02/24/2016   Procedure: ESOPHAGOGASTRODUODENOSCOPY (EGD) WITH PROPOFOL;  Surgeon: Lucilla Lame, MD;  Location: [place];  Service: Endoscopy;   Laterality: N/A;  . ESOPHAGOGASTRODUODENOSCOPY ENDOSCOPY  03/2013   Dr. Allen Norris  . THORACOTOMY  1953    Home Medications:  Current Meds  Medication Sig  . acetaminophen (TYLENOL) 325 MG tablet Take 325-650 mg by mouth every 6 (six) hours as needed for mild pain, moderate pain, fever or headache.   Marland Kitchen atorvastatin (LIPITOR) 10 MG tablet Take 10 mg by mouth every evening.   . clopidogrel (PLAVIX) 75 MG tablet Take 75 mg by mouth daily.  . metoprolol (LOPRESSOR) 50 MG tablet Take 1 tablet (50 mg total) by mouth 2 (two) times daily.  . Multiple Vitamin (MULTIVITAMIN WITH MINERALS) TABS tablet Take 1 tablet by mouth daily.  Marland Kitchen triamcinolone cream (KENALOG) 0.1 % Apply 1 application topically 2 (two) times daily.  . vitamin B-12 (CYANOCOBALAMIN) 1000 MCG tablet Take 1,000 mcg by mouth daily.    Allergies:  Allergies  Allergen Reactions  . Finasteride Nausea Only and Other (See Comments)    Reaction:  Confusion   . Lisinopril Other (See Comments)    Reaction:  Confusion    No family history on file.  Social History:  reports that he has quit smoking. He has never used smokeless tobacco. He reports that he does not drink alcohol. No history on file for drug.  ROS: A complete review of systems was performed.  All systems are negative except for pertinent findings as noted.  Physical Exam:  Vital signs in last 24 hours: Temp:  [97.5 F (36.4 C)-98.5 F (36.9 C)] 97.5 F (36.4 C) (11/06 0754) Pulse Rate:  [58-76] 60 (11/06 0754) Resp:  [14-20] 18 (11/06 0754) BP: (126-191)/(65-91) 137/72 (11/06 0754) SpO2:  [95 %-100 %] 97 % (11/06 0754)  Weight:  [71.5 kg-72.6 kg] 71.5 kg (11/05 2325) Constitutional:  Awake, no acute distress HEENT: Very hard of hearing, [place] AT, moist mucus membranes. Cardiovascular: No clubbing, cyanosis, or edema. Respiratory: Normal respiratory effort GI: Abdomen is soft, nontender, nondistended, no abdominal masses GU: Foley catheter in place draining yellow  urine Skin: No rashes, bruises or suspicious lesions Neurologic: Deferred Psychiatric: Confused mood and affect  Laboratory Data:  Recent Labs    04/10/19 1500 04/11/19 0629  WBC 8.8 10.7*  HGB 14.2 12.3*  HCT 43.2 37.3*   Recent Labs    04/10/19 1500 04/11/19 0629  NA 141 140  K 3.5 3.7  CL 107 110  CO2 23 22  GLUCOSE 131* 92  BUN 21 20  CREATININE 1.26* 1.19  CALCIUM 9.4 8.2*   Recent Labs    04/10/19 1500  INR 1.0   Urinalysis    Component Value Date/Time   COLORURINE YELLOW (A) 04/10/2019 1735   APPEARANCEUR HAZY (A) 04/10/2019 1735   APPEARANCEUR Clear 07/12/2011 1236   LABSPEC 1.014 04/10/2019 1735   LABSPEC 1.010 07/12/2011 1236   PHURINE 6.0 04/10/2019 1735   GLUCOSEU NEGATIVE 04/10/2019 1735   GLUCOSEU Negative 07/12/2011 1236   HGBUR SMALL (A) 04/10/2019 1735   BILIRUBINUR NEGATIVE 04/10/2019 1735   BILIRUBINUR Negative 07/12/2011 1236   KETONESUR NEGATIVE 04/10/2019 1735   PROTEINUR NEGATIVE 04/10/2019 1735   NITRITE POSITIVE (A) 04/10/2019 1735   LEUKOCYTESUR LARGE (A) 04/10/2019 1735   LEUKOCYTESUR Negative 07/12/2011 1236   Results for orders placed or performed during the hospital encounter of 04/10/19  SARS CORONAVIRUS 2 (TAT 6-24 HRS) Nasopharyngeal Nasopharyngeal Swab     Status: None   Collection Time: 04/10/19  6:20 PM   Specimen: Nasopharyngeal Swab  Result Value Ref Range Status   SARS Coronavirus 2 NEGATIVE NEGATIVE Final    Comment: (NOTE) SARS-CoV-2 target nucleic acids are NOT DETECTED. The SARS-CoV-2 RNA is generally detectable in upper and lower respiratory specimens during the acute phase of infection. Negative results do not preclude SARS-CoV-2 infection, do not rule out co-infections with other pathogens, and should not be used as the sole basis for treatment or other patient management decisions. Negative results must be combined with clinical observations, patient history, and epidemiological information. The expected  result is Negative. Fact Sheet for Patients: HairSlick.no Fact Sheet for Healthcare Providers: quierodirigir.com This test is not yet approved or cleared by the [place] FDA and  has been authorized for detection and/or diagnosis of SARS-CoV-2 by FDA under an Emergency Use Authorization (EUA). This EUA will remain  in effect (meaning this test can be used) for the duration of the COVID-19 declaration under Section 56 4(b)(1) of the Act, 21 U.S.C. section 360bbb-3(b)(1), unless the authorization is terminated or revoked sooner. Performed at [facility], [address] [place], [place] [address]     Radiologic Imaging: Ct Abdomen Pelvis W Contrast  Result Date: 04/10/2019 CLINICAL DATA:  Altered level of consciousness, nausea vomiting. EXAM: CT ABDOMEN AND PELVIS WITH CONTRAST TECHNIQUE: Multidetector CT imaging of the abdomen and pelvis was performed using the standard protocol following bolus administration of intravenous contrast. CONTRAST:  17mL OMNIPAQUE IOHEXOL 300 MG/ML  SOLN COMPARISON:  August 15, 2016 FINDINGS: Lower chest: Signs of interstitial thickening. Fluid-filled bronchi. Process worse on the right, superimposed atelectasis. Small hiatal hernia. Hepatobiliary: Liver is normal. No signs of biliary ductal dilation. Small gallstone in the neck of the gallbladder. Pancreas: Unremarkable. No pancreatic ductal dilatation or surrounding  inflammatory changes. Spleen: Normal in size without focal abnormality. Adrenals/Urinary Tract: Adrenal glands are normal. Marked fullness of the right renal pelvis compatible with right UPJ obstruction appears more pronounced than on the previous exam. There is a 9 mm calculus in the dependent aspect of the renal pelvis, away from the right UPJ. There also 2 separate collecting system calculi in infundibulum of the interpolar right kidney that are approximately 7-9 mm in greatest size,  similar however compared to the study of August 15, 2016. The urinary bladder is distended and there are numerous small calculi in the bladder base. Signs of renal cortical scarring which are present bilaterally. Cortical thickness is similar to the prior study on the right despite collecting system distension. Stomach/Bowel: Moderate-size hiatal hernia. Signs of colonic diverticulosis. Normal appendix. Vascular/Lymphatic: Tortuous abdominal aorta with calcified and noncalcified plaque. No signs of abdominal or pelvic lymphadenopathy. Reproductive: Prostatomegaly. Nonspecific and filling much of the low pelvis measuring approximately 5.5 x 6 cm. Other: No signs of free air or hernia. No free fluid. Musculoskeletal: No signs of acute bone finding or evidence of destructive bone process. IMPRESSION: 1. 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 Left extrarenal pelvis as well with slight increased fullness. Correlate with any signs of urinary tract infection in this patient who may have bladder outlet obstruction due to prostatomegaly. Catheterization and follow-up renal sonogram may be helpful. 2. Potential aspiration pneumonitis developing in the right lung base, superimposed on chronic interstitial thickening. 3. Nephrolithiasis with 3 large calculi in the right renal collecting systems and renal pelvis, not contributing to increased dilation of the right renal pelvis. 4. Cholelithiasis. 5. Atherosclerotic changes in the abdominal aorta. Aortic Atherosclerosis (ICD10-I70.0). Electronically Signed   By: Donzetta Kohut M.D.   On: 04/10/2019 16:24   I personally reviewed the imaging above and note right hydronephrosis compatible with chronic UPJ obstruction, bladder distention, and prostatomegaly.  Assessment & Plan:  85 year old male admitted with acute CVA and found to have urinary tract infection in the setting of a chronic right UPJ  obstruction.  Creatinine downtrending following Foley catheter placement.  Urine cultures pending.  Recommendations: -Continue Foley with outpatient voiding trial in 10-14 days -Continue antibiotics for 2-week course, pending urine cultures -No tx indicated for chronic UPJ obstruction  Thank you for involving me in this patient's care, please page with any further questions or concerns.  Carman Ching, PA-C 04/11/2019 8:58 AM

## 2019-04-11 NOTE — Progress Notes (Signed)
[facility]  - [place] at [facility]   PATIENT NAME: Matthew Greer    MR#:  [id number]  DATE OF BIRTH:  [birth year]  SUBJECTIVE:   pt is extremely hard on hearing. Spoke with patient's partner Ida Rogue in the room  Patient denies any pain. Continues to have diplopia. Has acute on chronic right sided weakness. Poor appetite. No fever. REVIEW OF SYSTEMS:   Review of Systems  Unable to perform ROS: Medical condition  HENT: Positive for hearing loss.   Eyes: Positive for double vision.  extremely hard on hearing Tolerating Diet: Tolerating PT:   DRUG ALLERGIES:   Allergies  Allergen Reactions  . Finasteride Nausea Only and Other (See Comments)    Reaction:  Confusion   . Lisinopril Other (See Comments)    Reaction:  Confusion    VITALS:  Blood pressure (!) 143/71, pulse (!) 55, temperature 97.7 F (36.5 C), temperature source Oral, resp. rate 18, height 6\' 2"  (1.88 m), weight 71.5 kg, SpO2 99 %.  PHYSICAL EXAMINATION:   Physical Exam limited exam since patient is extremely hard on hearing and following instructions  GENERAL:  85 y.o.-year-old patient lying in the bed with no acute distress.  EYES: Pupils equal, round, reactive to light and accommodation. No scleral icterus. Extraocular muscles intact.  HEENT: Head atraumatic, normocephalic. Oropharynx and nasopharynx clear.  NECK:  Supple, no jugular venous distention. No thyroid enlargement, no tenderness.  LUNGS: Normal breath sounds bilaterally, no wheezing, rales, rhonchi. No use of accessory muscles of respiration.  CARDIOVASCULAR: S1, S2 normal. No murmurs, rubs, or gallops.  ABDOMEN: Soft, nontender, nondistended. Bowel sounds present. No organomegaly or mass.  EXTREMITIES: No cyanosis, clubbing or edema b/l.    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   LABORATORY PANEL:   CBC Recent Labs  Lab 04/11/19 0629  WBC 10.7*  HGB 12.3*  HCT 37.3*  PLT 217    Chemistries  Recent Labs  Lab 04/10/19 1500 04/11/19 0629  NA 141 140  K 3.5 3.7  CL 107 110  CO2 23 22  GLUCOSE 131* 92  BUN 21 20  CREATININE 1.26* 1.19  CALCIUM 9.4 8.2*  AST 22  --   ALT 18  --   ALKPHOS 80  --   BILITOT 1.0  --    Cardiac Enzymes No results for input(s): TROPONINI in the last 168 hours. RADIOLOGY:  Ct Head Wo Contrast  Result Date: 04/10/2019 CLINICAL DATA:  Cerebral hemorrhage suspected, 2 episodes of nausea vomiting, unexplained. Also with altered mental status. EXAM: CT HEAD WITHOUT CONTRAST TECHNIQUE: Contiguous axial images were obtained from the base of the skull through the vertex without intravenous contrast. COMPARISON:  05/25/2016 FINDINGS: Brain: Signs of atrophy worsened slightly since the prior study. No signs of intracranial hemorrhage, mass effect, midline shift or hydrocephalus. Signs of remote infarct in the left internal capsule and periventricular white matter. Vascular: No hyperdense vessel or unexpected calcification. Skull: Normal. Negative for fracture or focal lesion. Sinuses/Orbits: Secretions in the ethmoid sinuses and mild mucoperiosteal thickening. No fluid level. Other: None. IMPRESSION: 1. No acute intracranial abnormality. 2. Evolution of previous infarct from 2017 in the left internal capsule. 3. Sinus disease as above. Electronically Signed   By: 2018 M.D.   On: 04/10/2019 16:13   Mr Brain Wo Contrast  Result Date: 04/10/2019 CLINICAL DATA:  Hemiplegia. Additional history provided: Patient  arrives from home after 2 episodes of vomiting since 7 a.m., unable to hold conversation. Elevated blood pressure. EXAM: MRI HEAD WITHOUT CONTRAST TECHNIQUE: Multiplanar, multiecho pulse sequences of the brain and surrounding structures were obtained without intravenous contrast. COMPARISON:  Head CT performed earlier the same day 04/10/2019, brain  MRI 05/25/2016, brain MRI 02/12/2011 FINDINGS: Brain: Multiple sequences are motion degraded. There is a 7 mm region of restricted diffusion within the midline pons, slightly eccentric to the left consistent with acute infarct (series 5, image 17) (series 7, images 17-19). No evidence of intracranial mass. No midline shift or extra-axial fluid collection. There is a background of moderate chronic small vessel ischemic disease. Redemonstrated chronic infarct within the left corona radiata/internal capsule with a small amount of hemosiderin staining at this site. Associated wallerian degeneration with atrophy of the left cerebral peduncle. Redemonstrated small chronic lacunar infarct within the left thalamus. Mild generalized parenchymal atrophy. Vascular: Flow voids maintained within the proximal large arterial vessels. Skull and upper cervical spine: No focal marrow lesion Sinuses/Orbits: Visualized orbits demonstrate no acute abnormality. Paranasal sinus mucosal thickening, greatest within bilateral ethmoid air cells (moderate). 2.3 x 0.5 cm focus of polypoid soft tissue within the posterior right nasal passage (series 10, image 10). Small bilateral mastoid effusions. These results were called by telephone at the time of interpretation on 04/10/2019 at 9:52 pm to provider Dr. Alford Highland, who verbally acknowledged these results. IMPRESSION: 1. 7 mm acute infarct within the midline pons slightly eccentric to the left. 2. Generalized parenchymal atrophy with moderate chronic small vessel ischemic disease. Redemonstrated chronic infarcts within the left corona radiata/internal capsule and left thalamus. 3. Paranasal sinus disease as described. Polypoid soft tissue within the posterior right nasal passage measuring 2.3 x 0.5 cm. 4. Small bilateral mastoid effusions. Electronically Signed   By: Kellie Simmering DO   On: 04/10/2019 21:55   Ct Abdomen Pelvis W Contrast  Result Date: 04/10/2019 CLINICAL DATA:  Altered level  of consciousness, nausea vomiting. EXAM: CT ABDOMEN AND PELVIS WITH CONTRAST TECHNIQUE: Multidetector CT imaging of the abdomen and pelvis was performed using the standard protocol following bolus administration of intravenous contrast. CONTRAST:  56mL OMNIPAQUE IOHEXOL 300 MG/ML  SOLN COMPARISON:  August 15, 2016 FINDINGS: Lower chest: Signs of interstitial thickening. Fluid-filled bronchi. Process worse on the right, superimposed atelectasis. Small hiatal hernia. Hepatobiliary: Liver is normal. No signs of biliary ductal dilation. Small gallstone in the neck of the gallbladder. Pancreas: Unremarkable. No pancreatic ductal dilatation or surrounding inflammatory changes. Spleen: Normal in size without focal abnormality. Adrenals/Urinary Tract: Adrenal glands are normal. Marked fullness of the right renal pelvis compatible with right UPJ obstruction appears more pronounced than on the previous exam. There is a 9 mm calculus in the dependent aspect of the renal pelvis, away from the right UPJ. There also 2 separate collecting system calculi in infundibulum of the interpolar right kidney that are approximately 7-9 mm in greatest size, similar however compared to the study of August 15, 2016. The urinary bladder is distended and there are numerous small calculi in the bladder base. Signs of renal cortical scarring which are present bilaterally. Cortical thickness is similar to the prior study on the right despite collecting system distension. Stomach/Bowel: Moderate-size hiatal hernia. Signs of colonic diverticulosis. Normal appendix. Vascular/Lymphatic: Tortuous abdominal aorta with calcified and noncalcified plaque. No signs of abdominal or pelvic lymphadenopathy. Reproductive: Prostatomegaly. Nonspecific and filling much of the low pelvis measuring approximately 5.5 x 6 cm. Other: No signs of free  air or hernia. No free fluid. Musculoskeletal: No signs of acute bone finding or evidence of destructive bone process.  IMPRESSION: 1. 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 Left extrarenal pelvis as well with slight increased fullness. Correlate with any signs of urinary tract infection in this patient who may have bladder outlet obstruction due to prostatomegaly. Catheterization and follow-up renal sonogram may be helpful. 2. Potential aspiration pneumonitis developing in the right lung base, superimposed on chronic interstitial thickening. 3. Nephrolithiasis with 3 large calculi in the right renal collecting systems and renal pelvis, not contributing to increased dilation of the right renal pelvis. 4. Cholelithiasis. 5. Atherosclerotic changes in the abdominal aorta. Aortic Atherosclerosis (ICD10-I70.0). Electronically Signed   By: Donzetta Kohut M.D.   On: 04/10/2019 16:24   US Carotid Bilateral (at [facility] Only)  Result Date: 04/11/2019 CLINICAL DATA:  Acute cerebral infarct.  History of hypertension. EXAM: BILATERAL CAROTID DUPLEX ULTRASOUND TECHNIQUE: Wallace Cullens scale imaging, color Doppler and duplex ultrasound were performed of bilateral carotid and vertebral arteries in the neck. COMPARISON:  05/26/2016 FINDINGS: Criteria: Quantification of carotid stenosis is based on velocity parameters that correlate the residual internal carotid diameter with NASCET-based stenosis levels, using the diameter of the distal internal carotid lumen as the denominator for stenosis measurement. The following velocity measurements were obtained: RIGHT ICA:  95/19 cm/sec CCA:  89/12 cm/sec SYSTOLIC ICA/CCA RATIO:  1.0 ECA:  89 cm/sec LEFT ICA:  89/19 cm/sec CCA:  92/11 cm/sec SYSTOLIC ICA/CCA RATIO:  0.9 ECA:  103 cm/sec RIGHT CAROTID ARTERY: Minimal plaque in the proximal right ICA. Estimated right ICA stenosis is less than 50%. RIGHT VERTEBRAL ARTERY: Antegrade flow with normal waveform and velocity. LEFT CAROTID ARTERY:  No evidence of left ICA plaque or stenosis. LEFT  VERTEBRAL ARTERY: Antegrade flow with normal waveform and velocity. IMPRESSION: Minimal plaque in the proximal right ICA with estimated less than 50% right ICA stenosis. No evidence of left ICA plaque or stenosis. Electronically Signed   By: Irish Lack M.D.   On: 04/11/2019 07:59   Dg Chest Portable 1 View  Result Date: 04/10/2019 CLINICAL DATA:  Pt arrives via EMS from home after having 2 at the sites of vomiting since 7 AM- pt also has AMS, pt normally able to hold conversation and is unable to now- pt blood pressure reported to be high, history of stroke, hypertension, former smoker EXAM: PORTABLE CHEST 1 VIEW COMPARISON:  03/09/2018 FINDINGS: Heart size is normal. There is new dense opacity at the LEFT lung base, consistent with significant atelectasis or consolidation. Biapical pleuroparenchymal changes are again noted. Chronic changes in both shoulders. IMPRESSION: New LEFT lower lobe infiltrate or atelectasis. Electronically Signed   By: Norva Pavlov M.D.   On: 04/10/2019 15:39   ASSESSMENT AND PLAN:   Matthew Greer  is a 85 y.o. Caucasian male with a known history of hypertension and CVA with residual right-sided weakness, who presented to the emergency room with acute onset of altered status with associated worsening right-sided weakness and blurred vision with diplopia.  The patient has been having generalized weakness and had nausea and vomiting with reported abdominal pain.  1.  Acute CVA   -pt presented with  worsening right-sided hemiparesis(chronic deficit from previous cva) and blurred vision with diplopia. -MRI brain 7 mm acute infarct within the midline pons slightly eccentric to the left. -We will follow neuro checks q.4 hours for 24 hours.  -  on aspirin with Plavix.  -bilateral carotid Doppler Minimal plaque in the proximal right ICA with estimated less than 50% right ICA stenosis. No evidence of left ICA plaque or stenosis   -2D echo with bubble study   Pending -physical/occupation/speech therapy consults will be obtained in a.m.Marland Kitchen.  -continued on statin therapy   2.  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   - on IV Rocephin - urology consultation  With Dr. Michaelle BirksSinisky  Appreciated comments continue antibiotics and follow-up as outpatient.  3.  Mild acute kidney injury that could be on top of stage IIIa chronic kidney disease.  - He will be hydrated with IV normal saline and will follow BMP.  4.  Hypertensive urgency.  Permissive hypertension will be allowed in the setting of evolving CVA.  As needed IV labetalol will be utilized.  5.  Dyslipidemia.  We will continue statin therapy and check fasting lipids.  6.  DVT prophylaxis.  Subcutaneous Lovenox  Spoke with pt's family Ida RogueSanderson  Case discussed with Care Management/Social Worker. Management plans discussed with the patient, family and they are in agreement.  CODE STATUS: full  DVT Prophylaxis: lovenox  TOTAL TIME TAKING CARE OF THIS PATIENT: *30* minutes.  >50% time spent on counselling and coordination of care  POSSIBLE D/C IN *2-3* DAYS, DEPENDING ON CLINICAL CONDITION.  Note: This dictation was prepared with Dragon dictation along with smaller phrase technology. Any transcriptional errors that result from this process are unintentional.  Enedina FinnerSona Nhu Glasby M.D on 04/11/2019 at 3:17 PM  Between 7am to 6pm - Pager - [phone]  After 6pm go to [email or web address] - password [email or web address] [facility]   CC: Primary care physician; Rolm GalaGrandis, Heidi, MDPatient ID: Matthew Greer, male   DOB: 06/24/1925, 85 y.o.   MRN: [id number]

## 2019-04-11 NOTE — Progress Notes (Signed)
Patient transferred to 1C.  °

## 2019-04-11 NOTE — Consult Note (Signed)
Reason for Consult:Stroke Referring Physician: Allena Katz. MD   Hard hearing patient, dysrathric  Very challenging interview to conduct. No hearing aids at bedside HPI:   Per note   "Matthew Greer  is a 85 y.o. Caucasian male with a known history of hypertension and CVA with residual right-sided weakness, who presented to the emergency room with acute onset of altered status with associated worsening right-sided weakness and blurred vision with diplopia.  The patient has been having generalized weakness and had nausea and vomiting with reported abdominal pain.  He was having altered mental status and was therefore a poor historian during my interview.  No reported bilious vomitus or hematemesis.  No reported fever or chills or headache or dizziness or blurred vision.  He had prostatic tenderness during rectal exam by the ER physician  Upon presentation to the emergency room, blood pressure was elevated 191/91 with otherwise normal vital signs.  Labs revealed borderline potassium of 3.5, blood glucose of 131 and unremarkable CBC.  UA was positive for UTI.  Her COVID-19 test is currently pending.  Blood cultures were drawn.  The patient was given a gram of IV Rocephin as well as 4 mg of IV with Zofran and 500 mill IV normal saline bolus.  He will be admitted to a medical monitored bed for further evaluation and management.  Head ct 11/5: No acute intracranial abnormality.Evolution of previous infarct from 2017 in the left interna capsule.  MRI 11/5: 7 mm acute infarct within the midline pons slightly eccentric to the left.   Past Medical History:  Diagnosis Date  . Hypertension   . Stroke [facility]) 2013   "minor" - no deficits    Past Surgical History:  Procedure Laterality Date  . ESOPHAGEAL DILATION  02/24/2016   Procedure: ESOPHAGEAL DILATION;  Surgeon: Midge Minium, MD;  Location: [facility];  Service: Endoscopy;;  . ESOPHAGOGASTRODUODENOSCOPY (EGD) WITH PROPOFOL N/A 02/24/2016    Procedure: ESOPHAGOGASTRODUODENOSCOPY (EGD) WITH PROPOFOL;  Surgeon: Midge Minium, MD;  Location: [facility];  Service: Endoscopy;  Laterality: N/A;  . ESOPHAGOGASTRODUODENOSCOPY ENDOSCOPY  03/2013   Dr. Servando Snare  . THORACOTOMY  1953    No family history on file.  Social History:  reports that he has quit smoking. He has never used smokeless tobacco. He reports that he does not drink alcohol. No history on file for drug.  Allergies  Allergen Reactions  . Finasteride Nausea Only and Other (See Comments)    Reaction:  Confusion   . Lisinopril Other (See Comments)    Reaction:  Confusion    Medications: I have reviewed the patient's current medications.  ROS: Unable to obtain Physical Examination: Blood pressure (!) 141/73, pulse (!) 53, temperature 98.2 F (36.8 C), temperature source Oral, resp. rate 16, height  (1.88 m), weight 71.5 kg, SpO2 99 %.  Neurologic Examination Alert, awake, dysarthric, seems to follow commands PERLA< EOMI< no nystagmus, right facial, face sensation difficult to assess, uvula/tongue midline Motor 4/5 on LUEX/LLEX, 2/5 on RUEX RLEX spastic' Sensory exam difficult to assess Coordination exam difficult to assess DTRs andgait not checked at ttime of evaluation  Results for orders placed or performed during the hospital encounter of 04/10/19 (from the past 48 hour(s))  Comprehensive metabolic panel     Status: Abnormal   Collection Time: 04/10/19  3:00 PM  Result Value Ref Range   Sodium 141 135 - 145 mmol/L   Potassium 3.5 3.5 - 5.1 mmol/L   Chloride 107 98 - 111 mmol/L  CO2 23 22 - 32 mmol/L   Glucose, Bld 131 (H) 70 - 99 mg/dL   BUN 21 8 - 23 mg/dL   Creatinine, Ser 4.54 (H) 0.61 - 1.24 mg/dL   Calcium 9.4 8.9 - 09.8 mg/dL   Total Protein 6.5 6.5 - 8.1 g/dL   Albumin 3.8 3.5 - 5.0 g/dL   AST 22 15 - 41 U/L   ALT 18 0 - 44 U/L   Alkaline Phosphatase 80 38 - 126 U/L   Total Bilirubin 1.0 0.3 - 1.2 mg/dL   GFR calc non Af Amer 50 (L)  >60 mL/min   GFR calc Af Amer 57 (L) >60 mL/min   Anion gap 11 5 - 15    Comment: Performed at [facility], [address] [place], [place] [address]  CBC     Status: None   Collection Time: 04/10/19  3:00 PM  Result Value Ref Range   WBC 8.8 4.0 - 10.5 K/uL   RBC 4.67 4.22 - 5.81 MIL/uL   Hemoglobin 14.2 13.0 - 17.0 g/dL   HCT 78.2 95.6 - 21.3 %   MCV 92.5 80.0 - 100.0 fL   MCH 30.4 26.0 - 34.0 pg   MCHC 32.9 30.0 - 36.0 g/dL   RDW 08.6 57.8 - 46.9 %   Platelets 244 150 - 400 K/uL   nRBC 0.0 0.0 - 0.2 %    Comment: Performed at [facility], [address] [place], [place] [address]  Protime-INR - (order if patient is taking Coumadin / Warfarin)     Status: None   Collection Time: 04/10/19  3:00 PM  Result Value Ref Range   Prothrombin Time 13.0 11.4 - 15.2 seconds   INR 1.0 0.8 - 1.2    Comment: (NOTE) INR goal varies based on device and disease states. Performed at [facility], [address] [place], [place] [address]   Urinalysis, Complete w Microscopic     Status: Abnormal   Collection Time: 04/10/19  5:35 PM  Result Value Ref Range   Color, Urine YELLOW (A) YELLOW   APPearance HAZY (A) CLEAR   Specific Gravity, Urine 1.014 1.005 - 1.030   pH 6.0 5.0 - 8.0   Glucose, UA NEGATIVE NEGATIVE mg/dL   Hgb urine dipstick SMALL (A) NEGATIVE   Bilirubin Urine NEGATIVE NEGATIVE   Ketones, ur NEGATIVE NEGATIVE mg/dL   Protein, ur NEGATIVE NEGATIVE mg/dL   Nitrite POSITIVE (A) NEGATIVE   Leukocytes,Ua LARGE (A) NEGATIVE   RBC / HPF 6-10 0 - 5 RBC/hpf   WBC, UA >50 (H) 0 - 5 WBC/hpf   Bacteria, UA FEW (A) NONE SEEN   Squamous Epithelial / LPF NONE SEEN 0 - 5   Mucus PRESENT     Comment: Performed at [facility], [address] [place], [place] [address]  SARS CORONAVIRUS 2 (TAT 6-24 HRS) Nasopharyngeal Nasopharyngeal Swab     Status: None   Collection Time: 04/10/19  6:20 PM   Specimen: Nasopharyngeal Swab  Result Value Ref  Range   SARS Coronavirus 2 NEGATIVE NEGATIVE    Comment: (NOTE) SARS-CoV-2 target nucleic acids are NOT DETECTED. The SARS-CoV-2 RNA is generally detectable in upper and lower respiratory specimens during the acute phase of infection. Negative results do not preclude SARS-CoV-2 infection, do not rule out co-infections with other pathogens, and should not be used as the sole basis for treatment or other patient management decisions. Negative results must be combined with clinical observations,  patient history, and epidemiological information. The expected result is Negative. Fact Sheet for Patients: SugarRoll.be Fact Sheet for Healthcare Providers: https://www.woods-mathews.com/ This test is not yet approved or cleared by the [place] FDA and  has been authorized for detection and/or diagnosis of SARS-CoV-2 by FDA under an Emergency Use Authorization (EUA). This EUA will remain  in effect (meaning this test can be used) for the duration of the COVID-19 declaration under Section 56 4(b)(1) of the Act, 21 U.S.C. section 360bbb-3(b)(1), unless the authorization is terminated or revoked sooner. Performed at [facility], [place] [address], [place], [place] [address]   Lipid panel     Status: None   Collection Time: 04/11/19  6:29 AM  Result Value Ref Range   Cholesterol 141 0 - 200 mg/dL   Triglycerides 45 <150 mg/dL   HDL 45 >40 mg/dL   Total CHOL/HDL Ratio 3.1 RATIO   VLDL 9 0 - 40 mg/dL   LDL Cholesterol 87 0 - 99 mg/dL    Comment:        Total Cholesterol/HDL:CHD Risk Coronary Heart Disease Risk Table                     Men   Women  1/2 Average Risk   3.4   3.3  Average Risk       5.0   4.4  2 X Average Risk   9.6   7.1  3 X Average Risk  23.4   11.0        Use the calculated Patient Ratio above and the CHD Risk Table to determine the patient's CHD Risk.        ATP III CLASSIFICATION (LDL):  <100     mg/dL   Optimal   100-129  mg/dL   Near or Above                    Optimal  130-159  mg/dL   Borderline  160-189  mg/dL   High  >190     mg/dL   Very High Performed at [facility], [address] [place], [place] [address]   Basic metabolic panel     Status: Abnormal   Collection Time: 04/11/19  6:29 AM  Result Value Ref Range   Sodium 140 135 - 145 mmol/L   Potassium 3.7 3.5 - 5.1 mmol/L   Chloride 110 98 - 111 mmol/L   CO2 22 22 - 32 mmol/L   Glucose, Bld 92 70 - 99 mg/dL   BUN 20 8 - 23 mg/dL   Creatinine, Ser 1.19 0.61 - 1.24 mg/dL   Calcium 8.2 (L) 8.9 - 10.3 mg/dL   GFR calc non Af Amer 53 (L) >60 mL/min   GFR calc Af Amer >60 >60 mL/min   Anion gap 8 5 - 15    Comment: Performed at [facility], [address] [place], [place] [address]  CBC     Status: Abnormal   Collection Time: 04/11/19  6:29 AM  Result Value Ref Range   WBC 10.7 (H) 4.0 - 10.5 K/uL   RBC 4.06 (L) 4.22 - 5.81 MIL/uL   Hemoglobin 12.3 (L) 13.0 - 17.0 g/dL   HCT 37.3 (L) 39.0 - 52.0 %   MCV 91.9 80.0 - 100.0 fL   MCH 30.3 26.0 - 34.0 pg   MCHC 33.0 30.0 - 36.0 g/dL   RDW 12.8 11.5 - 15.5 %   Platelets 217 150 -  400 K/uL   nRBC 0.0 0.0 - 0.2 %    Comment: Performed at [facility], [address] [place], [place] [address]    Recent Results (from the past 240 hour(s))  SARS CORONAVIRUS 2 (TAT 6-24 HRS) Nasopharyngeal Nasopharyngeal Swab     Status: None   Collection Time: 04/10/19  6:20 PM   Specimen: Nasopharyngeal Swab  Result Value Ref Range Status   SARS Coronavirus 2 NEGATIVE NEGATIVE Final    Comment: (NOTE) SARS-CoV-2 target nucleic acids are NOT DETECTED. The SARS-CoV-2 RNA is generally detectable in upper and lower respiratory specimens during the acute phase of infection. Negative results do not preclude SARS-CoV-2 infection, do not rule out co-infections with other pathogens, and should not be used as the sole basis for treatment or other patient management  decisions. Negative results must be combined with clinical observations, patient history, and epidemiological information. The expected result is Negative. Fact Sheet for Patients: HairSlick.nohttps://www.fda.gov/media/138098/download Fact Sheet for Healthcare Providers: quierodirigir.comhttps://www.fda.gov/media/138095/download This test is not yet approved or cleared by the [place] FDA and  has been authorized for detection and/or diagnosis of SARS-CoV-2 by FDA under an Emergency Use Authorization (EUA). This EUA will remain  in effect (meaning this test can be used) for the duration of the COVID-19 declaration under Section 56 4(b)(1) of the Act, 21 U.S.C. section 360bbb-3(b)(1), unless the authorization is terminated or revoked sooner. Performed at [facility], [address] [place], [place] [address]     Ct Head Wo Contrast  Result Date: 04/10/2019 CLINICAL DATA:  Cerebral hemorrhage suspected, 2 episodes of nausea vomiting, unexplained. Also with altered mental status. EXAM: CT HEAD WITHOUT CONTRAST TECHNIQUE: Contiguous axial images were obtained from the base of the skull through the vertex without intravenous contrast. COMPARISON:  05/25/2016 FINDINGS: Brain: Signs of atrophy worsened slightly since the prior study. No signs of intracranial hemorrhage, mass effect, midline shift or hydrocephalus. Signs of remote infarct in the left internal capsule and periventricular white matter. Vascular: No hyperdense vessel or unexpected calcification. Skull: Normal. Negative for fracture or focal lesion. Sinuses/Orbits: Secretions in the ethmoid sinuses and mild mucoperiosteal thickening. No fluid level. Other: None. IMPRESSION: 1. No acute intracranial abnormality. 2. Evolution of previous infarct from 2017 in the left internal capsule. 3. Sinus disease as above. Electronically Signed   By: Donzetta KohutGeoffrey  Wile M.D.   On: 04/10/2019 16:13   Mr Brain Wo Contrast  Result Date: 04/10/2019 CLINICAL DATA:   Hemiplegia. Additional history provided: Patient arrives from home after 2 episodes of vomiting since 7 a.m., unable to hold conversation. Elevated blood pressure. EXAM: MRI HEAD WITHOUT CONTRAST TECHNIQUE: Multiplanar, multiecho pulse sequences of the brain and surrounding structures were obtained without intravenous contrast. COMPARISON:  Head CT performed earlier the same day 04/10/2019, brain MRI 05/25/2016, brain MRI 02/12/2011 FINDINGS: Brain: Multiple sequences are motion degraded. There is a 7 mm region of restricted diffusion within the midline pons, slightly eccentric to the left consistent with acute infarct (series 5, image 17) (series 7, images 17-19). No evidence of intracranial mass. No midline shift or extra-axial fluid collection. There is a background of moderate chronic small vessel ischemic disease. Redemonstrated chronic infarct within the left corona radiata/internal capsule with a small amount of hemosiderin staining at this site. Associated wallerian degeneration with atrophy of the left cerebral peduncle. Redemonstrated small chronic lacunar infarct within the left thalamus. Mild generalized parenchymal atrophy. Vascular: Flow voids maintained within the proximal large arterial vessels. Skull and upper  cervical spine: No focal marrow lesion Sinuses/Orbits: Visualized orbits demonstrate no acute abnormality. Paranasal sinus mucosal thickening, greatest within bilateral ethmoid air cells (moderate). 2.3 x 0.5 cm focus of polypoid soft tissue within the posterior right nasal passage (series 10, image 10). Small bilateral mastoid effusions. These results were called by telephone at the time of interpretation on 04/10/2019 at 9:52 pm to provider Dr. Merilynn Finlandobertson, who verbally acknowledged these results. IMPRESSION: 1. 7 mm acute infarct within the midline pons slightly eccentric to the left. 2. Generalized parenchymal atrophy with moderate chronic small vessel ischemic disease. Redemonstrated chronic  infarcts within the left corona radiata/internal capsule and left thalamus. 3. Paranasal sinus disease as described. Polypoid soft tissue within the posterior right nasal passage measuring 2.3 x 0.5 cm. 4. Small bilateral mastoid effusions. Electronically Signed   By: Jackey LogeKyle  Golden DO   On: 04/10/2019 21:55   Ct Abdomen Pelvis W Contrast  Result Date: 04/10/2019 CLINICAL DATA:  Altered level of consciousness, nausea vomiting. EXAM: CT ABDOMEN AND PELVIS WITH CONTRAST TECHNIQUE: Multidetector CT imaging of the abdomen and pelvis was performed using the standard protocol following bolus administration of intravenous contrast. CONTRAST:  75mL OMNIPAQUE IOHEXOL 300 MG/ML  SOLN COMPARISON:  August 15, 2016 FINDINGS: Lower chest: Signs of interstitial thickening. Fluid-filled bronchi. Process worse on the right, superimposed atelectasis. Small hiatal hernia. Hepatobiliary: Liver is normal. No signs of biliary ductal dilation. Small gallstone in the neck of the gallbladder. Pancreas: Unremarkable. No pancreatic ductal dilatation or surrounding inflammatory changes. Spleen: Normal in size without focal abnormality. Adrenals/Urinary Tract: Adrenal glands are normal. Marked fullness of the right renal pelvis compatible with right UPJ obstruction appears more pronounced than on the previous exam. There is a 9 mm calculus in the dependent aspect of the renal pelvis, away from the right UPJ. There also 2 separate collecting system calculi in infundibulum of the interpolar right kidney that are approximately 7-9 mm in greatest size, similar however compared to the study of August 15, 2016. The urinary bladder is distended and there are numerous small calculi in the bladder base. Signs of renal cortical scarring which are present bilaterally. Cortical thickness is similar to the prior study on the right despite collecting system distension. Stomach/Bowel: Moderate-size hiatal hernia. Signs of colonic diverticulosis. Normal  appendix. Vascular/Lymphatic: Tortuous abdominal aorta with calcified and noncalcified plaque. No signs of abdominal or pelvic lymphadenopathy. Reproductive: Prostatomegaly. Nonspecific and filling much of the low pelvis measuring approximately 5.5 x 6 cm. Other: No signs of free air or hernia. No free fluid. Musculoskeletal: No signs of acute bone finding or evidence of destructive bone process. IMPRESSION: 1. 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 Left extrarenal pelvis as well with slight increased fullness. Correlate with any signs of urinary tract infection in this patient who may have bladder outlet obstruction due to prostatomegaly. Catheterization and follow-up renal sonogram may be helpful. 2. Potential aspiration pneumonitis developing in the right lung base, superimposed on chronic interstitial thickening. 3. Nephrolithiasis with 3 large calculi in the right renal collecting systems and renal pelvis, not contributing to increased dilation of the right renal pelvis. 4. Cholelithiasis. 5. Atherosclerotic changes in the abdominal aorta. Aortic Atherosclerosis (ICD10-I70.0). Electronically Signed   By: Donzetta KohutGeoffrey  Wile M.D.   On: 04/10/2019 16:24   [place] Carotid Bilateral (at [facility] And [facility] Only)  Result Date: 04/11/2019 CLINICAL DATA:  Acute cerebral infarct.  History of hypertension. EXAM: BILATERAL CAROTID DUPLEX  ULTRASOUND TECHNIQUE: Wallace Cullens scale imaging, color Doppler and duplex ultrasound were performed of bilateral carotid and vertebral arteries in the neck. COMPARISON:  05/26/2016 FINDINGS: Criteria: Quantification of carotid stenosis is based on velocity parameters that correlate the residual internal carotid diameter with NASCET-based stenosis levels, using the diameter of the distal internal carotid lumen as the denominator for stenosis measurement. The following velocity measurements were obtained: RIGHT ICA:  95/19 cm/sec CCA:   89/12 cm/sec SYSTOLIC ICA/CCA RATIO:  1.0 ECA:  89 cm/sec LEFT ICA:  89/19 cm/sec CCA:  92/11 cm/sec SYSTOLIC ICA/CCA RATIO:  0.9 ECA:  103 cm/sec RIGHT CAROTID ARTERY: Minimal plaque in the proximal right ICA. Estimated right ICA stenosis is less than 50%. RIGHT VERTEBRAL ARTERY: Antegrade flow with normal waveform and velocity. LEFT CAROTID ARTERY:  No evidence of left ICA plaque or stenosis. LEFT VERTEBRAL ARTERY: Antegrade flow with normal waveform and velocity. IMPRESSION: Minimal plaque in the proximal right ICA with estimated less than 50% right ICA stenosis. No evidence of left ICA plaque or stenosis. Electronically Signed   By: Irish Lack M.D.   On: 04/11/2019 07:59   Dg Chest Portable 1 View  Result Date: 04/10/2019 CLINICAL DATA:  Pt arrives via EMS from home after having 2 at the sites of vomiting since 7 AM- pt also has AMS, pt normally able to hold conversation and is unable to now- pt blood pressure reported to be high, history of stroke, hypertension, former smoker EXAM: PORTABLE CHEST 1 VIEW COMPARISON:  03/09/2018 FINDINGS: Heart size is normal. There is new dense opacity at the LEFT lung base, consistent with significant atelectasis or consolidation. Biapical pleuroparenchymal changes are again noted. Chronic changes in both shoulders. IMPRESSION: New LEFT lower lobe infiltrate or atelectasis. Electronically Signed   By: Norva Pavlov M.D.   On: 04/10/2019 15:39     Assessment/Plan: "Matthew Greer  is a 85 y.o. Caucasian male with a known history of hypertension and CVA with residual right-sided weakness, who presented to the emergency room with acute onset of altered status with associated worsening right-sided weakness and blurred vision with diplopia. In whom neuro exam shows right sided hemiparesis and MRI with 7 mm acute infarct within the midline pons slightly eccentric to the left.  RECS: - Neuro protectives measures while admityted including normothermia, normoglycemia,  correct electrolytes/metabolic abnlities, treat any infection - Stroke w/up including echo/US - PT/OT - swallow eval - ASA/statin if no CI and not on it. - dvt prophylaxis - Will follow up with you   04/11/2019, 4:09 PM

## 2019-04-11 NOTE — NC FL2 (Signed)
[place] LEVEL OF CARE SCREENING TOOL     IDENTIFICATION  Patient Name: Matthew Greer Birthdate: 10-Dec-1925 Sex: male Admission Date (Current Location): 04/10/2019  [place] and [place] Number:  Engineering geologist and Address:  [facility], [address], [place], [place] [address]      Provider Number: [id number]  Attending Physician Name and Address:  Fritzi Mandes, MD  Relative Name and Phone Number:  Waldo Laine [phone]    Current Level of Care: Hospital Recommended Level of Care: [place] Prior Approval Number:    Date Approved/Denied:   PASRR Number: [id number]  Discharge Plan: SNF    Current Diagnoses: Patient Active Problem List   Diagnosis Date Noted  . AKI (acute kidney injury) ([place])   . Hearing impaired person, bilateral   . Obstructive uropathy 04/10/2019  . Acute CVA (cerebrovascular accident) ([place]) 04/10/2019  . Acute blood loss anemia 08/15/2016  . Hip hematoma, right, initial encounter 08/15/2016  . CVA (cerebral vascular accident) ([place]) 05/25/2016  . Problems with swallowing and mastication   . Stricture and stenosis of esophagus   . Benign essential hypertension 01/27/2016  . Kidney stones 01/27/2016  . Other nonspecific abnormal finding of lung field 01/27/2016  . Pure hypercholesterolemia 01/27/2016  . Stroke ([place]) 01/27/2016  . 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 Rotator cuff tendonitis 11/10/2013  . Hearing loss 04/09/2013  . Eczema 07/26/2012  . Abdominal pain 12/27/2011    Orientation RESPIRATION BLADDER Height & Weight     Self, Place  Normal Indwelling catheter Weight: 71.5 kg Height:  6\' 2"  (188 cm)  BEHAVIORAL SYMPTOMS/MOOD NEUROLOGICAL BOWEL NUTRITION STATUS      Continent Diet(Dysphagia Diet 2 nectar thick liquids)  AMBULATORY STATUS COMMUNICATION OF NEEDS Skin   Extensive Assist Verbally Normal                       Personal  Care Assistance Level of Assistance  Bathing, Feeding, Dressing Bathing Assistance: Maximum assistance Feeding assistance: Maximum assistance Dressing Assistance: Maximum assistance     Functional Limitations Info  Hearing   Hearing Info: Impaired      SPECIAL CARE FACTORS FREQUENCY  PT (By licensed PT), OT (By licensed OT)     PT Frequency: 5 times per week OT Frequency: 5 times per week            Contractures      Additional Factors Info  Code Status, Allergies Code Status Info: Full Allergies Info: Finasteride, lisinopril           Current Medications (04/11/2019):  This is the current hospital active medication list Current Facility-Administered Medications  Medication Dose Route Frequency Provider Last Rate Last Dose  . 0.9 %  sodium chloride infusion   Intravenous Continuous Mansy, Jan A, MD 100 mL/hr at 04/11/19 1030    . acetaminophen (TYLENOL) tablet 650 mg  650 mg Oral Q6H PRN Mansy, Jan A, MD       Or  . acetaminophen (TYLENOL) suppository 650 mg  650 mg Rectal Q6H PRN Mansy, Jan A, MD      . aspirin EC tablet 81 mg  81 mg Oral Daily Mansy, Jan A, MD   81 mg at 04/11/19 1036  . atorvastatin (LIPITOR) tablet 10 mg  10 mg Oral QPM Mansy, Jan A, MD   10 mg at 04/10/19 2251  . cefTRIAXone (ROCEPHIN) 1 g in sodium chloride 0.9 % 100  mL IVPB  1 g Intravenous Q24H Mansy, Jan A, MD      . Chlorhexidine Gluconate Cloth 2 % PADS 6 each  6 each Topical Daily Mansy, Vernetta Honey, MD   6 each at 04/11/19 1036  . clopidogrel (PLAVIX) tablet 75 mg  75 mg Oral Daily Mansy, Jan A, MD   75 mg at 04/11/19 1035  . enoxaparin (LOVENOX) injection 40 mg  40 mg Subcutaneous Q24H Mansy, Jan A, MD   40 mg at 04/11/19 0143  . labetalol (NORMODYNE) injection 20 mg  20 mg Intravenous Q3H PRN Mansy, Jan A, MD      . magnesium hydroxide (MILK OF MAGNESIA) suspension 30 mL  30 mL Oral Daily PRN Mansy, Jan A, MD      . metoprolol tartrate (LOPRESSOR) tablet 50 mg  50 mg Oral BID Mansy, Jan A, MD    50 mg at 04/11/19 1036  . ondansetron (ZOFRAN) tablet 4 mg  4 mg Oral Q6H PRN Mansy, Jan A, MD       Or  . ondansetron [facility]) injection 4 mg  4 mg Intravenous Q6H PRN Mansy, Jan A, MD   4 mg at 04/11/19 1056  . traZODone (DESYREL) tablet 25 mg  25 mg Oral QHS PRN Mansy, Vernetta Honey, MD         Discharge Medications: Please see discharge summary for a list of discharge medications.  Relevant Imaging Results:  Relevant Lab Results:   Additional Information SS# [id number]  Allayne Butcher, RN

## 2019-04-12 DIAGNOSIS — N3000 Acute cystitis without hematuria: Secondary | ICD-10-CM

## 2019-04-12 DIAGNOSIS — I63312 Cerebral infarction due to thrombosis of left middle cerebral artery: Secondary | ICD-10-CM

## 2019-04-12 MED ORDER — ALUM & MAG HYDROXIDE-SIMETH 200-200-20 MG/5ML PO SUSP: 30.0000 mL | ORAL | Status: DC | PRN

## 2019-04-12 NOTE — Progress Notes (Signed)
Physical Therapy Treatment Patient Details Name: Matthew Greer MRN: [id number] DOB: September 29, 1925 Today's Date: 04/12/2019    History of Present Illness Pt is a 85 y.o male who was admitted to [facility] with worsening right sided weakness, and visual changes with bluriness, and diplopia. Pt. is very HOH and requires large print text for communication. PMHx includes: hypertension and CVA with residual right sided weakness. Imaging revealed an Acute pontine infarct. Pt. presents also with AMS, Obstructive uropathy, AKI, HTN, and HLD.    PT Comments    Ready to get up to chair.  Awake this am.  To edge of bed with min/mod a x 1.  Some difficulty getting trunk fully upright.  Once sitting, generally steady with supervision.  Stand pivot to recliner at bedside with mod a x 1.  Pt reaches for chair and tries to sit before fully turned but overall does well.  Participated in exercises as described below. Pt seemed pleased to be up in chair this am.  [facility] makes communication difficult at times but does well with hand motion directions.   Follow Up Recommendations  SNF     Equipment Recommendations       Recommendations for Other Services       Precautions / Restrictions Precautions Precautions: None Restrictions Weight Bearing Restrictions: No    Mobility  Bed Mobility Overal bed mobility: Needs Assistance Bed Mobility: Supine to Sit     Supine to sit: Mod assist;Min assist        Transfers Overall transfer level: Needs assistance Equipment used: 1 person hand held assist Transfers: Sit to/from Stand;Stand Pivot Transfers Sit to Stand: Mod assist Stand pivot transfers: Mod assist          Ambulation/Gait             General Gait Details: Unable   Stairs             Wheelchair Mobility    Modified Rankin (Stroke Patients Only)       Balance Overall balance assessment: Needs assistance Sitting-balance support: Feet supported;Single extremity  supported Sitting balance-Leahy Scale: Fair     Standing balance support: Single extremity supported Standing balance-Leahy Scale: Poor Standing balance comment: stand pivot to chair                            Cognition Arousal/Alertness: Awake/alert Behavior During Therapy: WFL for tasks assessed/performed Overall Cognitive Status: Difficult to assess                                        Exercises Other Exercises Other Exercises: RLE aarom LAQ and marches.  RUE and hand stretching, washcloth roll placed in hand after stretch    General Comments        Pertinent Vitals/Pain Pain Assessment: No/denies pain    Home Living                      Prior Function            PT Goals (current goals can now be found in the care plan section) Progress towards PT goals: Progressing toward goals    Frequency    7X/week      PT Plan Current plan remains appropriate    Co-evaluation  AM-PAC PT "6 Clicks" Mobility   Outcome Measure  Help needed turning from your back to your side while in a flat bed without using bedrails?: A Little Help needed moving from lying on your back to sitting on the side of a flat bed without using bedrails?: A Little Help needed moving to and from a bed to a chair (including a wheelchair)?: A Lot Help needed standing up from a chair using your arms (e.g., wheelchair or bedside chair)?: A Lot Help needed to walk in hospital room?: Total Help needed climbing 3-5 steps with a railing? : Total 6 Click Score: 12    End of Session Equipment Utilized During Treatment: Gait belt Activity Tolerance: Patient tolerated treatment well Patient left: in chair;with call bell/phone within reach;with chair alarm set Nurse Communication: Mobility status Hemiplegia - Right/Left: Right Hemiplegia - dominant/non-dominant: Dominant Hemiplegia - caused by: Cerebral infarction     Time: 0806-0822 PT  Time Calculation (min) (ACUTE ONLY): 16 min  Charges:  $Therapeutic Exercise: 8-22 mins                     Danielle Dess, PTA 04/12/19, 8:48 AM

## 2019-04-12 NOTE — Progress Notes (Signed)
[facility]  - [place] at [facility]   PATIENT NAME: Matthew Greer    MR#:  [id number]  DATE OF BIRTH:  [birth year]  SUBJECTIVE:   pt has his hearing aid!! Feeling better. Out in the [employer] with patient's partner Mr Ida Rogue in the room  Patient denies any pain. Continues to have diplopia. Has acute on chronic right sided weakness. Poor appetite. No fever. REVIEW OF SYSTEMS:   Review of Systems  Constitutional: Negative for chills, fever and weight loss.  HENT: Positive for hearing loss. Negative for ear discharge, ear pain and nosebleeds.   Eyes: Positive for double vision. Negative for blurred vision, pain and discharge.  Respiratory: Negative for sputum production, shortness of breath, wheezing and stridor.   Cardiovascular: Negative for chest pain, palpitations, orthopnea and PND.  Gastrointestinal: Negative for abdominal pain, diarrhea, nausea and vomiting.  Genitourinary: Negative for frequency and urgency.  Musculoskeletal: Negative for back pain and joint pain.  Neurological: Positive for focal weakness and weakness. Negative for sensory change and speech change.  Psychiatric/Behavioral: Negative for depression and hallucinations. The patient is not nervous/anxious.   extremely hard on hearing  Tolerating Diet:yes Tolerating PT: rehab  DRUG ALLERGIES:   Allergies  Allergen Reactions  . Finasteride Nausea Only and Other (See Comments)    Reaction:  Confusion   . Lisinopril Other (See Comments)    Reaction:  Confusion    VITALS:  Blood pressure (!) 172/81, pulse (!) 59, temperature 97.8 F (36.6 C), temperature source Oral, resp. rate 20, height 6\' 2"  (1.88 m), weight 71.5 kg, SpO2 99 %.  PHYSICAL EXAMINATION:   Physical Exam limited exam since patient is extremely hard on hearing and following instructions  GENERAL:  85 y.o.-year-old patient lying in the bed with no acute distress.  EYES: Pupils equal, round, reactive to light and  accommodation. No scleral icterus. Extraocular muscles intact.  HEENT: Head atraumatic, normocephalic. Oropharynx and nasopharynx clear.  NECK:  Supple, no jugular venous distention. No thyroid enlargement, no tenderness.  LUNGS: Normal breath sounds bilaterally, no wheezing, rales, rhonchi. No use of accessory muscles of respiration.  CARDIOVASCULAR: S1, S2 normal. No murmurs, rubs, or gallops.  ABDOMEN: Soft, nontender, nondistended. Bowel sounds present. No organomegaly or mass.  EXTREMITIES: No cyanosis, clubbing or edema b/l.    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   LABORATORY PANEL:  CBC Recent Labs  Lab 04/11/19 0629  WBC 10.7*  HGB 12.3*  HCT 37.3*  PLT 217    Chemistries  Recent Labs  Lab 04/10/19 1500 04/11/19 0629  NA 141 140  K 3.5 3.7  CL 107 110  CO2 23 22  GLUCOSE 131* 92  BUN 21 20  CREATININE 1.26* 1.19  CALCIUM 9.4 8.2*  AST 22  --   ALT 18  --   ALKPHOS 80  --   BILITOT 1.0  --    Cardiac Enzymes No results for input(s): TROPONINI in the last 168 hours. RADIOLOGY:  Ct Head Wo Contrast  Result Date: 04/10/2019 CLINICAL DATA:  Cerebral hemorrhage suspected, 2 episodes of nausea vomiting, unexplained. Also with altered mental status. EXAM: CT HEAD WITHOUT CONTRAST TECHNIQUE: Contiguous axial images were obtained from the base of the skull through the vertex without intravenous contrast. COMPARISON:  05/25/2016 FINDINGS: Brain: Signs of atrophy worsened slightly since the prior study. No signs of intracranial hemorrhage, mass effect, midline  shift or hydrocephalus. Signs of remote infarct in the left internal capsule and periventricular white matter. Vascular: No hyperdense vessel or unexpected calcification. Skull: Normal. Negative for fracture or focal lesion. Sinuses/Orbits: Secretions in the ethmoid sinuses and mild  mucoperiosteal thickening. No fluid level. Other: None. IMPRESSION: 1. No acute intracranial abnormality. 2. Evolution of previous infarct from 2017 in the left internal capsule. 3. Sinus disease as above. Electronically Signed   By: Donzetta KohutGeoffrey  Wile M.D.   On: 04/10/2019 16:13   Mr Brain Wo Contrast  Result Date: 04/10/2019 CLINICAL DATA:  Hemiplegia. Additional history provided: Patient arrives from home after 2 episodes of vomiting since 7 a.m., unable to hold conversation. Elevated blood pressure. EXAM: MRI HEAD WITHOUT CONTRAST TECHNIQUE: Multiplanar, multiecho pulse sequences of the brain and surrounding structures were obtained without intravenous contrast. COMPARISON:  Head CT performed earlier the same day 04/10/2019, brain MRI 05/25/2016, brain MRI 02/12/2011 FINDINGS: Brain: Multiple sequences are motion degraded. There is a 7 mm region of restricted diffusion within the midline pons, slightly eccentric to the left consistent with acute infarct (series 5, image 17) (series 7, images 17-19). No evidence of intracranial mass. No midline shift or extra-axial fluid collection. There is a background of moderate chronic small vessel ischemic disease. Redemonstrated chronic infarct within the left corona radiata/internal capsule with a small amount of hemosiderin staining at this site. Associated wallerian degeneration with atrophy of the left cerebral peduncle. Redemonstrated small chronic lacunar infarct within the left thalamus. Mild generalized parenchymal atrophy. Vascular: Flow voids maintained within the proximal large arterial vessels. Skull and upper cervical spine: No focal marrow lesion Sinuses/Orbits: Visualized orbits demonstrate no acute abnormality. Paranasal sinus mucosal thickening, greatest within bilateral ethmoid air cells (moderate). 2.3 x 0.5 cm focus of polypoid soft tissue within the posterior right nasal passage (series 10, image 10). Small bilateral mastoid effusions. These results  were called by telephone at the time of interpretation on 04/10/2019 at 9:52 pm to provider Dr. Merilynn Finlandobertson, who verbally acknowledged these results. IMPRESSION: 1. 7 mm acute infarct within the midline pons slightly eccentric to the left. 2. Generalized parenchymal atrophy with moderate chronic small vessel ischemic disease. Redemonstrated chronic infarcts within the left corona radiata/internal capsule and left thalamus. 3. Paranasal sinus disease as described. Polypoid soft tissue within the posterior right nasal passage measuring 2.3 x 0.5 cm. 4. Small bilateral mastoid effusions. Electronically Signed   By: Jackey LogeKyle  Golden DO   On: 04/10/2019 21:55   Ct Abdomen Pelvis W Contrast  Result Date: 04/10/2019 CLINICAL DATA:  Altered level of consciousness, nausea vomiting. EXAM: CT ABDOMEN AND PELVIS WITH CONTRAST TECHNIQUE: Multidetector CT imaging of the abdomen and pelvis was performed using the standard protocol following bolus administration of intravenous contrast. CONTRAST:  75mL OMNIPAQUE IOHEXOL 300 MG/ML  SOLN COMPARISON:  August 15, 2016 FINDINGS: Lower chest: Signs of interstitial thickening. Fluid-filled bronchi. Process worse on the right, superimposed atelectasis. Small hiatal hernia. Hepatobiliary: Liver is normal. No signs of biliary ductal dilation. Small gallstone in the neck of the gallbladder. Pancreas: Unremarkable. No pancreatic ductal dilatation or surrounding inflammatory changes. Spleen: Normal in size without focal abnormality. Adrenals/Urinary Tract: Adrenal glands are normal. Marked fullness of the right renal pelvis compatible with right UPJ obstruction appears more pronounced than on the previous exam. There is a 9 mm calculus in the dependent aspect of the renal pelvis, away from the right UPJ. There also 2 separate collecting system calculi in infundibulum of the interpolar right kidney that are approximately  7-9 mm in greatest size, similar however compared to the study of August 15, 2016. The urinary bladder is distended and there are numerous small calculi in the bladder base. Signs of renal cortical scarring which are present bilaterally. Cortical thickness is similar to the prior study on the right despite collecting system distension. Stomach/Bowel: Moderate-size hiatal hernia. Signs of colonic diverticulosis. Normal appendix. Vascular/Lymphatic: Tortuous abdominal aorta with calcified and noncalcified plaque. No signs of abdominal or pelvic lymphadenopathy. Reproductive: Prostatomegaly. Nonspecific and filling much of the low pelvis measuring approximately 5.5 x 6 cm. Other: No signs of free air or hernia. No free fluid. Musculoskeletal: No signs of acute bone finding or evidence of destructive bone process. IMPRESSION: 1. 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 Left extrarenal pelvis as well with slight increased fullness. Correlate with any signs of urinary tract infection in this patient who may have bladder outlet obstruction due to prostatomegaly. Catheterization and follow-up renal sonogram may be helpful. 2. Potential aspiration pneumonitis developing in the right lung base, superimposed on chronic interstitial thickening. 3. Nephrolithiasis with 3 large calculi in the right renal collecting systems and renal pelvis, not contributing to increased dilation of the right renal pelvis. 4. Cholelithiasis. 5. Atherosclerotic changes in the abdominal aorta. Aortic Atherosclerosis (ICD10-I70.0). Electronically Signed   By: Zetta Bills M.D.   On: 04/10/2019 16:24   US Carotid Bilateral (at [facility] Only)  Result Date: 04/11/2019 CLINICAL DATA:  Acute cerebral infarct.  History of hypertension. EXAM: BILATERAL CAROTID DUPLEX ULTRASOUND TECHNIQUE: Pearline Cables scale imaging, color Doppler and duplex ultrasound were performed of bilateral carotid and vertebral arteries in the neck. COMPARISON:  05/26/2016 FINDINGS:  Criteria: Quantification of carotid stenosis is based on velocity parameters that correlate the residual internal carotid diameter with NASCET-based stenosis levels, using the diameter of the distal internal carotid lumen as the denominator for stenosis measurement. The following velocity measurements were obtained: RIGHT ICA:  95/19 cm/sec CCA:  49/70 cm/sec SYSTOLIC ICA/CCA RATIO:  1.0 ECA:  89 cm/sec LEFT ICA:  89/19 cm/sec CCA:  26/37 cm/sec SYSTOLIC ICA/CCA RATIO:  0.9 ECA:  103 cm/sec RIGHT CAROTID ARTERY: Minimal plaque in the proximal right ICA. Estimated right ICA stenosis is less than 50%. RIGHT VERTEBRAL ARTERY: Antegrade flow with normal waveform and velocity. LEFT CAROTID ARTERY:  No evidence of left ICA plaque or stenosis. LEFT VERTEBRAL ARTERY: Antegrade flow with normal waveform and velocity. IMPRESSION: Minimal plaque in the proximal right ICA with estimated less than 50% right ICA stenosis. No evidence of left ICA plaque or stenosis. Electronically Signed   By: Aletta Edouard M.D.   On: 04/11/2019 07:59   Dg Chest Portable 1 View  Result Date: 04/10/2019 CLINICAL DATA:  Pt arrives via EMS from home after having 2 at the sites of vomiting since 7 AM- pt also has AMS, pt normally able to hold conversation and is unable to now- pt blood pressure reported to be high, history of stroke, hypertension, former smoker EXAM: PORTABLE CHEST 1 VIEW COMPARISON:  03/09/2018 FINDINGS: Heart size is normal. There is new dense opacity at the LEFT lung base, consistent with significant atelectasis or consolidation. Biapical pleuroparenchymal changes are again noted. Chronic changes in both shoulders. IMPRESSION: New LEFT lower lobe infiltrate or atelectasis. Electronically Signed   By: Nolon Nations M.D.   On: 04/10/2019 15:39   ASSESSMENT AND PLAN:   Antavius Sperbeck  is a 85 y.o. Caucasian  male with a known history of hypertension and CVA with residual right-sided weakness, who presented to the emergency  room with acute onset of altered status with associated worsening right-sided weakness and blurred vision with diplopia.  The patient has been having generalized weakness and had nausea and vomiting with reported abdominal pain.  1.  Acute CVA - left Pons -pt presented with  worsening right-sided hemiparesis(chronic deficit from previous cva) and blurred vision with diplopia. -MRI brain 7 mm acute infarct within the midline pons slightly eccentric to the left. -follow neuro checks q.4 hours for 24 hours.  - on aspirin with Plavix.  -bilateral carotid Doppler Minimal plaque in the proximal right ICA with estimated less than 50% right ICA stenosis. No evidence of left ICA plaque or stenosis   -2D echo with bubble study negative -physical/occupation/speech therapy consults appreciated--recommneds rehab. Pt and family wants to take pt home--CM for d/c planning.  -continued on statin therapy   2. 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   - on IV Rocephin--UC pending. Will switch to po keflex  3. Mild acute kidney injury that could be on top of stage IIIa chronic kidney disease.  -Creatinine much improved after IVF  4. Hypertensive urgency.  Permissive hypertension will be allowed in the setting of evolving CVA.    5. Dyslipidemia. -continue statin therapy -lipid profile WNL  6.  DVT prophylaxis.   -Subcutaneous Lovenox  Spoke with pt's family Ida Rogue anticipate d/c in am if continues to improve unless pt changes mind to go to rehab  Case discussed with Care Management/Social Worker. Management plans discussed with the patient, family and they are in agreement.  CODE STATUS: full  DVT Prophylaxis: lovenox  TOTAL TIME TAKING CARE OF THIS PATIENT: *25* minutes.  >50% time spent on counselling and coordination of care  POSSIBLE D/C IN *1* DAYS, DEPENDING ON CLINICAL CONDITION.  Note: This dictation was prepared with Dragon dictation along  with smaller phrase technology. Any transcriptional errors that result from this process are unintentional.  Enedina Finner M.D on 04/12/2019 at 2:00 PM  Between 7am to 6pm - Pager - [phone]  After 6pm go to [email or web address] - password [email or web address] [facility]   CC: Primary care physician; Rolm Gala, MDPatient ID: Pershing Cox, male   DOB: Oct 15, 1925, 85 y.o.   MRN: [id number]

## 2019-04-12 NOTE — Plan of Care (Signed)
  Problem: Education: Goal: Knowledge of General Education information will improve Description: Including pain rating scale, medication(s)/side effects and non-pharmacologic comfort measures Outcome: Progressing   Problem: Health Behavior/Discharge Planning: Goal: Ability to manage health-related needs will improve Outcome: Progressing   Problem: Clinical Measurements: Goal: Ability to maintain clinical measurements within normal limits will improve Outcome: Progressing Goal: Will remain free from infection Outcome: Progressing Goal: Diagnostic test results will improve Outcome: Progressing Goal: Respiratory complications will improve Outcome: Progressing Goal: Cardiovascular complication will be avoided Outcome: Progressing   Problem: Activity: Goal: Risk for activity intolerance will decrease Outcome: Progressing   Problem: Nutrition: Goal: Adequate nutrition will be maintained Outcome: Progressing   Problem: Coping: Goal: Level of anxiety will decrease Outcome: Progressing   Problem: Elimination: Goal: Will not experience complications related to bowel motility Outcome: Progressing Goal: Will not experience complications related to urinary retention Outcome: Progressing   Problem: Pain Managment: Goal: General experience of comfort will improve Outcome: Progressing   Problem: Safety: Goal: Ability to remain free from injury will improve Outcome: Progressing   Problem: Skin Integrity: Goal: Risk for impaired skin integrity will decrease Outcome: Progressing   Problem: Education: Goal: Knowledge of disease or condition will improve Outcome: Progressing Goal: Knowledge of secondary prevention will improve Outcome: Progressing Goal: Knowledge of patient specific risk factors addressed and post discharge goals established will improve Outcome: Progressing   Problem: Coping: Goal: Will verbalize positive feelings about self Outcome: Progressing Goal: Will  identify appropriate support needs Outcome: Progressing   Problem: Health Behavior/Discharge Planning: Goal: Ability to manage health-related needs will improve Outcome: Progressing   Problem: Self-Care: Goal: Ability to participate in self-care as condition permits will improve Outcome: Progressing Goal: Verbalization of feelings and concerns over difficulty with self-care will improve Outcome: Progressing   Problem: Ischemic Stroke/TIA Tissue Perfusion: Goal: Complications of ischemic stroke/TIA will be minimized Outcome: Progressing   

## 2019-04-12 NOTE — Progress Notes (Signed)
Occupational Therapy Treatment Patient Details Name: Matthew Greer MRN: [id number] DOB: 01/20/1926 Today's Date: 04/12/2019    History of present illness Pt is a 85 y.o male who was admitted to [facility] with worsening right sided weakness, and visual changes with bluriness, and diplopia. Pt. is very HOH and requires large print text for communication. PMHx includes: hypertension and CVA with residual right sided weakness. Imaging revealed an Acute pontine infarct. Pt. presents also with AMS, Obstructive uropathy, AKI, HTN, and HLD.   OT comments  Patient seen this date for functional transfers from chair to bed with hand held assist, min assist to stand, min assist for stand pivot.  Bed mobility with min assist.  Patient is HOH and did not have hearing aids in during session.  Cues had to be repeated several times.   Patient seen for PROM to right UE, followed by [facility] for shoulder, elbow, wrist and hand.  Patient with spasticity present in right UE throughout.  Limited with shoulder flexion to 90 degrees, elbow flexion limited but could get full elbow extension.  Wrist limited in extension and digits flexed into palm.  Hand hygiene performed once ROM was completed in hand.  Patient reports he has a splint but states, "it doesn't do any good".  He may benefit from a palm protector for his right hand to promote skin integrity.   Continue to work towards goals in plan of care to improve safety and independence with necessary daily tasks.     Follow Up Recommendations  SNF    Equipment Recommendations       Recommendations for Other Services      Precautions / Restrictions Precautions Precautions: None;Fall Restrictions Weight Bearing Restrictions: No       Mobility Bed Mobility Overal bed mobility: Needs Assistance Bed Mobility: Supine to Sit     Supine to sit: Mod assist;Min assist Sit to supine: Min assist      Transfers Overall transfer level: Needs assistance Equipment used:  1 person hand held assist Transfers: Sit to/from Stand;Stand Pivot Transfers Sit to Stand: Min assist Stand pivot transfers: Min assist            Balance Overall balance assessment: Needs assistance Sitting-balance support: Feet supported;Single extremity supported Sitting balance-Leahy Scale: Fair   Postural control: Posterior lean   Standing balance-Leahy Scale: Poor                             ADL either performed or assessed with clinical judgement   ADL Overall ADL's : Needs assistance/impaired                         Toilet Transfer: Minimal assistance           Functional mobility during ADLs: Minimal assistance General ADL Comments: Min assist to transfer from chair to bed.     Vision       Perception     Praxis      Cognition Arousal/Alertness: Awake/alert Behavior During Therapy: WFL for tasks assessed/performed                                   General Comments: Hard of hearing and had already taken out hearing aids for the day        Exercises     Shoulder Instructions  General Comments      Pertinent Vitals/ Pain       Pain Assessment: No/denies pain  Home Living                                          Prior Functioning/Environment              Frequency  Min 2X/week        Progress Toward Goals  OT Goals(current goals can now be found in the care plan section)  Progress towards OT goals: Progressing toward goals  Acute Rehab OT Goals Patient Stated Goal: To regain independence OT Goal Formulation: With patient Time For Goal Achievement: 04/19/19 Potential to Achieve Goals: Good  Plan Discharge plan remains appropriate    Co-evaluation                 AM-PAC OT "6 Clicks" Daily Activity     Outcome Measure   Help from another person eating meals?: A Little Help from another person taking care of personal grooming?: A Lot Help from another  person toileting, which includes using toliet, bedpan, or urinal?: A Lot Help from another person bathing (including washing, rinsing, drying)?: A Lot Help from another person to put on and taking off regular upper body clothing?: A Lot Help from another person to put on and taking off regular lower body clothing?: Total 6 Click Score: 12    End of Session Equipment Utilized During Treatment: Gait belt  OT Visit Diagnosis: Muscle weakness (generalized) (M62.81)   Activity Tolerance Patient tolerated treatment well   Patient Left in bed   Nurse Communication          Time: 1430-1455 OT Time Calculation (min): 25 min  Charges: OT General Charges $OT Visit: 1 Visit OT Treatments $Self Care/Home Management : 8-22 mins $Therapeutic Exercise: 8-22 mins   T , OTR/L, CLT    , 04/12/2019, 3:47 PM

## 2019-04-12 NOTE — TOC Initial Note (Signed)
Transition of Care [facility]) - Initial/Assessment Note    Patient Details  Name: Matthew Greer MRN: [id number] Date of Birth: 1925/06/18  Transition of Care [facility]) CM/SW Contact:    Elliot Gurney [place], [place] Phone Number: 04/12/2019, 2:20 PM  Clinical Narrative:                    Patientis a8 y.o. malewith a known history of hypertension and CVA with residual right-sided weakness, who presented to the emergency room with altered mental status with right-sided weakness and blurred vision. Patient resides with his significant other Waldo Laine. Patient's significant other will provide transport for patient home upon discharge. Per Hubbard Robinson patient, has an aid through [place] that comes from 9am-11am daily to assist patient with his ADL's. Patient currently refusing SNF, however will accept [facility] services with no particular agency preference. Patient's significant other confirms that patient has a walker, wheelchair and hospital bed. He declines need for a bedside commode at this time. Orders for [facility] services ( PT,OT RN, Aid) requested. [place]  contacted, referral made.  This social worker to continue to assist with discharge plan.  Chrystal Land, LCSW Clinical Social Work [phone]   Expected Discharge Plan: [place] Barriers to Discharge: Continued Medical Work up   Patient Goals and CMS Choice Patient states their goals for this hospitalization and ongoing recovery are:: I want to get back as much strength as I can   Choice offered to / list presented to : Patient, Spouse  Expected Discharge Plan and Services Expected Discharge Plan: [place] In-house Referral: Clinical Social Work Discharge Planning Services: CM Consult Post Acute Care Choice: Durable Medical Equipment, Home Health Living arrangements for the past 2 months: Single Family Home                                      Prior Living  Arrangements/Services Living arrangements for the past 2 months: Single Family Home Lives with:: Significant Other Patient language and need for interpreter reviewed:: Yes Do you feel safe going back to the place where you live?: Yes      Need for Family Participation in Patient Care: Yes (Comment) Care giver support system in place?: Yes (comment) Current home services: Homehealth aide([facility] 9am-11am Monday-Friday) Criminal Activity/Legal Involvement Pertinent to Current Situation/Hospitalization: No - Comment as needed  Activities of Daily Living Home Assistive Devices/Equipment: Wheelchair ADL Screening (condition at time of admission) Patient's cognitive ability adequate to safely complete daily activities?: Yes Is the patient deaf or have difficulty hearing?: Yes Does the patient have difficulty seeing, even when wearing glasses/contacts?: No Does the patient have difficulty concentrating, remembering, or making decisions?: No Patient able to express need for assistance with ADLs?: Yes Does the patient have difficulty dressing or bathing?: Yes Independently performs ADLs?: No Communication: Independent Dressing (OT): Dependent Is this a change from baseline?: Pre-admission baseline Grooming: Dependent Is this a change from baseline?: Pre-admission baseline Feeding: Dependent Is this a change from baseline?: Pre-admission baseline Bathing: Dependent Is this a change from baseline?: Pre-admission baseline Toileting: Dependent Is this a change from baseline?: Pre-admission baseline In/Out Bed: Dependent Is this a change from baseline?: Pre-admission baseline Walks in Home: Dependent Is this a change from baseline?: Pre-admission baseline Does the patient have difficulty walking or climbing stairs?: Yes Weakness of Legs: Both Weakness of Arms/Hands:  Right  Permission Sought/Granted   Permission granted to share information with : Yes, Verbal Permission  Granted  Share Information with NAME: Arvil Chaco     Permission granted to share info w Relationship: spouse  Permission granted to share info w Contact Information: [phone]  Emotional Assessment Appearance:: Appears stated age Attitude/Demeanor/Rapport: Engaged, Self-Confident Affect (typically observed): Accepting, Adaptable Orientation: : Oriented to Self, Oriented to Place, Oriented to  Time, Oriented to Situation Alcohol / Substance Use: Never Used Psych Involvement: No (comment)  Admission diagnosis:  CVA (cerebral vascular accident) (HCC) [I63.9] Acute cystitis without hematuria [N30.00] AKI (acute kidney injury) (HCC) [N17.9] Altered mental status, unspecified altered mental status type [R41.82] Acute CVA (cerebrovascular accident) [facility]) [I63.9] Patient Active Problem List   Diagnosis Date Noted  . Acute cystitis without hematuria   . AKI (acute kidney injury) (HCC)   . Hearing impaired person, bilateral   . Obstructive uropathy 04/10/2019  . Acute CVA (cerebrovascular accident) (HCC) 04/10/2019  . Acute blood loss anemia 08/15/2016  . Hip hematoma, right, initial encounter 08/15/2016  . CVA (cerebral vascular accident) (HCC) 05/25/2016  . Problems with swallowing and mastication   . Stricture and stenosis of esophagus   . Benign essential hypertension 01/27/2016  . Kidney stones 01/27/2016  . Other nonspecific abnormal finding of lung field 01/27/2016  . Pure hypercholesterolemia 01/27/2016  . Stroke (HCC) 01/27/2016  . 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 Rotator cuff tendonitis 11/10/2013  . Hearing loss 04/09/2013  . Eczema 07/26/2012  . Abdominal pain 12/27/2011   PCP:  Rolm Gala, MD Pharmacy:   [facility] [phone] [facility], [place] - [address] AT [facility] [address] [place] [facility] [place] [address] Phone: [phone] Fax: [phone]     Social Determinants of Health (SDOH)  Interventions    Readmission Risk Interventions No flowsheet data found.

## 2019-04-12 NOTE — Progress Notes (Signed)
11/07 Patient states the he is feeling better, sitting on the chair States he is seeing doble '2 picture sided to side"  US carotid: 11/6 see EMR  Echo pending  Neuro exam: Patient is laert, awake, speech dysarthric, follows commands, oriented x3 PERLA< EOMI, no nystagmus noted, VFF seems full, right naso labial flattening( old finding per patient), face sensation to touch seems intact, uvula/tongue midline Motor: he is 1/5 on RUEx. 2/5 on RLEX ( old finding by patient). He is 5/5 on LUEX/LLEX  No sensory deficit appreciated No coordination deficit appreciated DTrs and gait not checked at time of evaluation  RECS: Neuro protectives measures while admityted including normothermia, normoglycemia, correct electrolytes/metabolic abnlities, treat any infection - Stroke w/up including echo/US. Will f/up - PT appreciate recs and help - OT. Appreciate recs and help. Might need diplopia glass - swallow eval - Appreciate Urology help and recs - ASA/statin if no CI and not on it. - dvt prophylaxis     INITIAL CONSUlLT 11/06 Reason for Consult:Stroke Referring Physician: Allena Greer. MD   Hard hearing patient, dysrathric  Very challenging interview to conduct. No hearing aids at bedside HPI:   Per note   "Matthew Greer  is a 85 y.o. Caucasian male with a known history of hypertension and CVA with residual right-sided weakness, who presented to the emergency room with acute onset of altered status with associated worsening right-sided weakness and blurred vision with diplopia.  The patient has been having generalized weakness and had nausea and vomiting with reported abdominal pain.  He was having altered mental status and was therefore a poor historian during my interview.  No reported bilious vomitus or hematemesis.  No reported fever or chills or headache or dizziness or blurred vision.  He had prostatic tenderness during rectal exam by the ER physician  Upon presentation to the emergency  room, blood pressure was elevated 191/91 with otherwise normal vital signs.  Labs revealed borderline potassium of 3.5, blood glucose of 131 and unremarkable CBC.  UA was positive for UTI.  Her COVID-19 test is currently pending.  Blood cultures were drawn.  The patient was given a gram of IV Rocephin as well as 4 mg of IV with Zofran and 500 mill IV normal saline bolus.  He will be admitted to a medical monitored bed for further evaluation and management.  Head ct 11/5: No acute intracranial abnormality.Evolution of previous infarct from 2017 in the left interna capsule.  MRI 11/5: 7 mm acute infarct within the midline pons slightly eccentric to the left.   Past Medical History:  Diagnosis Date  . Hypertension   . Stroke [facility]) 2013   "minor" - no deficits    Past Surgical History:  Procedure Laterality Date  . ESOPHAGEAL DILATION  02/24/2016   Procedure: ESOPHAGEAL DILATION;  Surgeon: Midge Minium, MD;  Location: [facility];  Service: Endoscopy;;  . ESOPHAGOGASTRODUODENOSCOPY (EGD) WITH PROPOFOL N/A 02/24/2016   Procedure: ESOPHAGOGASTRODUODENOSCOPY (EGD) WITH PROPOFOL;  Surgeon: Midge Minium, MD;  Location: [facility];  Service: Endoscopy;  Laterality: N/A;  . ESOPHAGOGASTRODUODENOSCOPY ENDOSCOPY  03/2013   Dr. Servando Snare  . THORACOTOMY  1953    No family history on file.  Social History:  reports that he has quit smoking. He has never used smokeless tobacco. He reports that he does not drink alcohol. No history on file for drug.  Allergies  Allergen Reactions  . Finasteride Nausea Only and Other (See Comments)    Reaction:  Confusion   .  Lisinopril Other (See Comments)    Reaction:  Confusion    Medications: I have reviewed the patient's current medications.  ROS: Unable to obtain Physical Examination: Blood pressure (!) 156/66, pulse (!) 53, temperature 97.8 F (36.6 C), temperature source Oral, resp. rate 16, height  (1.88 m), weight 71.5 kg, SpO2 95  %.  Neurologic Examination Alert, awake, dysarthric, seems to follow commands PERLA< EOMI< no nystagmus, right facial, face sensation difficult to assess, uvula/tongue midline Motor 4/5 on LUEX/LLEX, 2/5 on RUEX RLEX spastic' Sensory exam difficult to assess Coordination exam difficult to assess DTRs andgait not checked at ttime of evaluation  Results for orders placed or performed during the hospital encounter of 04/10/19 (from the past 48 hour(s))  Comprehensive metabolic panel     Status: Abnormal   Collection Time: 04/10/19  3:00 PM  Result Value Ref Range   Sodium 141 135 - 145 mmol/L   Potassium 3.5 3.5 - 5.1 mmol/L   Chloride 107 98 - 111 mmol/L   CO2 23 22 - 32 mmol/L   Glucose, Bld 131 (H) 70 - 99 mg/dL   BUN 21 8 - 23 mg/dL   Creatinine, Ser 4.09 (H) 0.61 - 1.24 mg/dL   Calcium 9.4 8.9 - 81.1 mg/dL   Total Protein 6.5 6.5 - 8.1 g/dL   Albumin 3.8 3.5 - 5.0 g/dL   AST 22 15 - 41 U/L   ALT 18 0 - 44 U/L   Alkaline Phosphatase 80 38 - 126 U/L   Total Bilirubin 1.0 0.3 - 1.2 mg/dL   GFR calc non Af Amer 50 (L) >60 mL/min   GFR calc Af Amer 57 (L) >60 mL/min   Anion gap 11 5 - 15    Comment: Performed at [facility], [address] [place], [place] [address]  CBC     Status: None   Collection Time: 04/10/19  3:00 PM  Result Value Ref Range   WBC 8.8 4.0 - 10.5 K/uL   RBC 4.67 4.22 - 5.81 MIL/uL   Hemoglobin 14.2 13.0 - 17.0 g/dL   HCT 29.5 62.1 - 30.8 %   MCV 92.5 80.0 - 100.0 fL   MCH 30.4 26.0 - 34.0 pg   MCHC 32.9 30.0 - 36.0 g/dL   RDW 65.7 84.6 - 96.2 %   Platelets 244 150 - 400 K/uL   nRBC 0.0 0.0 - 0.2 %    Comment: Performed at [facility], [address] [place], [place] [address]  Protime-INR - (order if patient is taking Coumadin / Warfarin)     Status: None   Collection Time: 04/10/19  3:00 PM  Result Value Ref Range   Prothrombin Time 13.0 11.4 - 15.2 seconds   INR 1.0 0.8 - 1.2    Comment: (NOTE) INR goal varies based on  device and disease states. Performed at [facility], [address] [place], [place] [address]   Urinalysis, Complete w Microscopic     Status: Abnormal   Collection Time: 04/10/19  5:35 PM  Result Value Ref Range   Color, Urine YELLOW (A) YELLOW   APPearance HAZY (A) CLEAR   Specific Gravity, Urine 1.014 1.005 - 1.030   pH 6.0 5.0 - 8.0   Glucose, UA NEGATIVE NEGATIVE mg/dL   Hgb urine dipstick SMALL (A) NEGATIVE   Bilirubin Urine NEGATIVE NEGATIVE   Ketones, ur NEGATIVE NEGATIVE mg/dL   Protein, ur NEGATIVE NEGATIVE mg/dL   Nitrite POSITIVE (A) NEGATIVE  Leukocytes,Ua LARGE (A) NEGATIVE   RBC / HPF 6-10 0 - 5 RBC/hpf   WBC, UA >50 (H) 0 - 5 WBC/hpf   Bacteria, UA FEW (A) NONE SEEN   Squamous Epithelial / LPF NONE SEEN 0 - 5   Mucus PRESENT     Comment: Performed at [facility], [address], [place], [place] [address]  Urine Culture     Status: None (Preliminary result)   Collection Time: 04/10/19  5:35 PM   Specimen: Urine, Random  Result Value Ref Range   Specimen Description      URINE, RANDOM Performed at [facility], [address] [place], [place] [address]    Special Requests      NONE Performed at [facility], [address] [place], [place] [address]    Culture      CULTURE REINCUBATED FOR BETTER GROWTH Performed at [facility], [address] [place], [place] [address]    Report Status PENDING   SARS CORONAVIRUS 2 (TAT 6-24 HRS) Nasopharyngeal Nasopharyngeal Swab     Status: None   Collection Time: 04/10/19  6:20 PM   Specimen: Nasopharyngeal Swab  Result Value Ref Range   SARS Coronavirus 2 NEGATIVE NEGATIVE    Comment: (NOTE) SARS-CoV-2 target nucleic acids are NOT DETECTED. The SARS-CoV-2 RNA is generally detectable in upper and lower respiratory specimens during the acute phase of infection. Negative results do not preclude SARS-CoV-2 infection, do not rule out co-infections with other  pathogens, and should not be used as the sole basis for treatment or other patient management decisions. Negative results must be combined with clinical observations, patient history, and epidemiological information. The expected result is Negative. Fact Sheet for Patients: HairSlick.no Fact Sheet for Healthcare Providers: quierodirigir.com This test is not yet approved or cleared by the [place] FDA and  has been authorized for detection and/or diagnosis of SARS-CoV-2 by FDA under an Emergency Use Authorization (EUA). This EUA will remain  in effect (meaning this test can be used) for the duration of the COVID-19 declaration under Section 56 4(b)(1) of the Act, 21 U.S.C. section 360bbb-3(b)(1), unless the authorization is terminated or revoked sooner. Performed at [facility], [address], [place], [place] [address]   Hemoglobin A1c     Status: None   Collection Time: 04/11/19  6:29 AM  Result Value Ref Range   Hgb A1c MFr Bld 5.3 4.8 - 5.6 %    Comment: (NOTE) Pre diabetes:          5.7%-6.4% Diabetes:              >6.4% Glycemic control for   <7.0% adults with diabetes    Mean Plasma Glucose 105.41 mg/dL    Comment: Performed at [facility], [address] [place], [place] [address]  Lipid panel     Status: None   Collection Time: 04/11/19  6:29 AM  Result Value Ref Range   Cholesterol 141 0 - 200 mg/dL   Triglycerides 45 <010 mg/dL   HDL 45 >27 mg/dL   Total CHOL/HDL Ratio 3.1 RATIO   VLDL 9 0 - 40 mg/dL   LDL Cholesterol 87 0 - 99 mg/dL    Comment:        Total Cholesterol/HDL:CHD Risk Coronary Heart Disease Risk Table                     Men   Women  1/2  Average Risk   3.4   3.3  Average Risk       5.0   4.4  2 X Average Risk   9.6   7.1  3 X Average Risk  23.4   11.0        Use the calculated Patient Ratio above and the CHD Risk Table to determine the patient's CHD Risk.        ATP  III CLASSIFICATION (LDL):  <100     mg/dL   Optimal  100-129  mg/dL   Near or Above                    Optimal  130-159  mg/dL   Borderline  160-189  mg/dL   High  >190     mg/dL   Very High Performed at [facility], [address] [place], [place] [address]   Basic metabolic panel     Status: Abnormal   Collection Time: 04/11/19  6:29 AM  Result Value Ref Range   Sodium 140 135 - 145 mmol/L   Potassium 3.7 3.5 - 5.1 mmol/L   Chloride 110 98 - 111 mmol/L   CO2 22 22 - 32 mmol/L   Glucose, Bld 92 70 - 99 mg/dL   BUN 20 8 - 23 mg/dL   Creatinine, Ser 1.19 0.61 - 1.24 mg/dL   Calcium 8.2 (L) 8.9 - 10.3 mg/dL   GFR calc non Af Amer 53 (L) >60 mL/min   GFR calc Af Amer >60 >60 mL/min   Anion gap 8 5 - 15    Comment: Performed at [facility], [address] [place], [place] [address]  CBC     Status: Abnormal   Collection Time: 04/11/19  6:29 AM  Result Value Ref Range   WBC 10.7 (H) 4.0 - 10.5 K/uL   RBC 4.06 (L) 4.22 - 5.81 MIL/uL   Hemoglobin 12.3 (L) 13.0 - 17.0 g/dL   HCT 37.3 (L) 39.0 - 52.0 %   MCV 91.9 80.0 - 100.0 fL   MCH 30.3 26.0 - 34.0 pg   MCHC 33.0 30.0 - 36.0 g/dL   RDW 12.8 11.5 - 15.5 %   Platelets 217 150 - 400 K/uL   nRBC 0.0 0.0 - 0.2 %    Comment: Performed at [facility], [address] [place], [place] [address]    Recent Results (from the past 240 hour(s))  Urine Culture     Status: None (Preliminary result)   Collection Time: 04/10/19  5:35 PM   Specimen: Urine, Random  Result Value Ref Range Status   Specimen Description   Final    URINE, RANDOM Performed at [facility], [address], [place], [place] [address]    Special Requests   Final    NONE Performed at [facility], [address], [place], [place] [address]    Culture   Final    CULTURE REINCUBATED FOR BETTER GROWTH Performed at [facility], [place] [address], [place], [place] [address]    Report Status PENDING   Incomplete  SARS CORONAVIRUS 2 (TAT 6-24 HRS) Nasopharyngeal Nasopharyngeal Swab     Status: None   Collection Time: 04/10/19  6:20 PM   Specimen: Nasopharyngeal Swab  Result Value Ref Range Status   SARS Coronavirus 2 NEGATIVE NEGATIVE Final    Comment: (NOTE) SARS-CoV-2 target nucleic acids are NOT DETECTED. The SARS-CoV-2 RNA is generally detectable in upper and lower  respiratory specimens during the acute phase of infection. Negative results do not preclude SARS-CoV-2 infection, do not rule out co-infections with other pathogens, and should not be used as the sole basis for treatment or other patient management decisions. Negative results must be combined with clinical observations, patient history, and epidemiological information. The expected result is Negative. Fact Sheet for Patients: HairSlick.nohttps://www.fda.gov/media/138098/download Fact Sheet for Healthcare Providers: quierodirigir.comhttps://www.fda.gov/media/138095/download This test is not yet approved or cleared by the [place] FDA and  has been authorized for detection and/or diagnosis of SARS-CoV-2 by FDA under an Emergency Use Authorization (EUA). This EUA will remain  in effect (meaning this test can be used) for the duration of the COVID-19 declaration under Section 56 4(b)(1) of the Act, 21 U.S.C. section 360bbb-3(b)(1), unless the authorization is terminated or revoked sooner. Performed at [facility], [address] [place], [place] [address]     Ct Head Wo Contrast  Result Date: 04/10/2019 CLINICAL DATA:  Cerebral hemorrhage suspected, 2 episodes of nausea vomiting, unexplained. Also with altered mental status. EXAM: CT HEAD WITHOUT CONTRAST TECHNIQUE: Contiguous axial images were obtained from the base of the skull through the vertex without intravenous contrast. COMPARISON:  05/25/2016 FINDINGS: Brain: Signs of atrophy worsened slightly since the prior study. No signs of intracranial hemorrhage, mass effect, midline  shift or hydrocephalus. Signs of remote infarct in the left internal capsule and periventricular white matter. Vascular: No hyperdense vessel or unexpected calcification. Skull: Normal. Negative for fracture or focal lesion. Sinuses/Orbits: Secretions in the ethmoid sinuses and mild mucoperiosteal thickening. No fluid level. Other: None. IMPRESSION: 1. No acute intracranial abnormality. 2. Evolution of previous infarct from 2017 in the left internal capsule. 3. Sinus disease as above. Electronically Signed   By: Donzetta KohutGeoffrey  Wile M.D.   On: 04/10/2019 16:13   Mr Brain Wo Contrast  Result Date: 04/10/2019 CLINICAL DATA:  Hemiplegia. Additional history provided: Patient arrives from home after 2 episodes of vomiting since 7 a.m., unable to hold conversation. Elevated blood pressure. EXAM: MRI HEAD WITHOUT CONTRAST TECHNIQUE: Multiplanar, multiecho pulse sequences of the brain and surrounding structures were obtained without intravenous contrast. COMPARISON:  Head CT performed earlier the same day 04/10/2019, brain MRI 05/25/2016, brain MRI 02/12/2011 FINDINGS: Brain: Multiple sequences are motion degraded. There is a 7 mm region of restricted diffusion within the midline pons, slightly eccentric to the left consistent with acute infarct (series 5, image 17) (series 7, images 17-19). No evidence of intracranial mass. No midline shift or extra-axial fluid collection. There is a background of moderate chronic small vessel ischemic disease. Redemonstrated chronic infarct within the left corona radiata/internal capsule with a small amount of hemosiderin staining at this site. Associated wallerian degeneration with atrophy of the left cerebral peduncle. Redemonstrated small chronic lacunar infarct within the left thalamus. Mild generalized parenchymal atrophy. Vascular: Flow voids maintained within the proximal large arterial vessels. Skull and upper cervical spine: No focal marrow lesion Sinuses/Orbits: Visualized orbits  demonstrate no acute abnormality. Paranasal sinus mucosal thickening, greatest within bilateral ethmoid air cells (moderate). 2.3 x 0.5 cm focus of polypoid soft tissue within the posterior right nasal passage (series 10, image 10). Small bilateral mastoid effusions. These results were called by telephone at the time of interpretation on 04/10/2019 at 9:52 pm to provider Dr. Merilynn Finlandobertson, who verbally acknowledged these results. IMPRESSION: 1. 7 mm acute infarct within the midline pons slightly eccentric to the left. 2. Generalized parenchymal atrophy with moderate chronic small vessel ischemic disease. Redemonstrated chronic infarcts  within the left corona radiata/internal capsule and left thalamus. 3. Paranasal sinus disease as described. Polypoid soft tissue within the posterior right nasal passage measuring 2.3 x 0.5 cm. 4. Small bilateral mastoid effusions. Electronically Signed   By: Jackey Loge DO   On: 04/10/2019 21:55   Ct Abdomen Pelvis W Contrast  Result Date: 04/10/2019 CLINICAL DATA:  Altered level of consciousness, nausea vomiting. EXAM: CT ABDOMEN AND PELVIS WITH CONTRAST TECHNIQUE: Multidetector CT imaging of the abdomen and pelvis was performed using the standard protocol following bolus administration of intravenous contrast. CONTRAST:  20mL OMNIPAQUE IOHEXOL 300 MG/ML  SOLN COMPARISON:  August 15, 2016 FINDINGS: Lower chest: Signs of interstitial thickening. Fluid-filled bronchi. Process worse on the right, superimposed atelectasis. Small hiatal hernia. Hepatobiliary: Liver is normal. No signs of biliary ductal dilation. Small gallstone in the neck of the gallbladder. Pancreas: Unremarkable. No pancreatic ductal dilatation or surrounding inflammatory changes. Spleen: Normal in size without focal abnormality. Adrenals/Urinary Tract: Adrenal glands are normal. Marked fullness of the right renal pelvis compatible with right UPJ obstruction appears more pronounced than on the previous exam. There is a  9 mm calculus in the dependent aspect of the renal pelvis, away from the right UPJ. There also 2 separate collecting system calculi in infundibulum of the interpolar right kidney that are approximately 7-9 mm in greatest size, similar however compared to the study of August 15, 2016. The urinary bladder is distended and there are numerous small calculi in the bladder base. Signs of renal cortical scarring which are present bilaterally. Cortical thickness is similar to the prior study on the right despite collecting system distension. Stomach/Bowel: Moderate-size hiatal hernia. Signs of colonic diverticulosis. Normal appendix. Vascular/Lymphatic: Tortuous abdominal aorta with calcified and noncalcified plaque. No signs of abdominal or pelvic lymphadenopathy. Reproductive: Prostatomegaly. Nonspecific and filling much of the low pelvis measuring approximately 5.5 x 6 cm. Other: No signs of free air or hernia. No free fluid. Musculoskeletal: No signs of acute bone finding or evidence of destructive bone process. IMPRESSION: 1. 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 Left extrarenal pelvis as well with slight increased fullness. Correlate with any signs of urinary tract infection in this patient who may have bladder outlet obstruction due to prostatomegaly. Catheterization and follow-up renal sonogram may be helpful. 2. Potential aspiration pneumonitis developing in the right lung base, superimposed on chronic interstitial thickening. 3. Nephrolithiasis with 3 large calculi in the right renal collecting systems and renal pelvis, not contributing to increased dilation of the right renal pelvis. 4. Cholelithiasis. 5. Atherosclerotic changes in the abdominal aorta. Aortic Atherosclerosis (ICD10-I70.0). Electronically Signed   By: Donzetta Kohut M.D.   On: 04/10/2019 16:24   US Carotid Bilateral (at [facility] Only)  Result Date: 04/11/2019 CLINICAL  DATA:  Acute cerebral infarct.  History of hypertension. EXAM: BILATERAL CAROTID DUPLEX ULTRASOUND TECHNIQUE: Wallace Cullens scale imaging, color Doppler and duplex ultrasound were performed of bilateral carotid and vertebral arteries in the neck. COMPARISON:  05/26/2016 FINDINGS: Criteria: Quantification of carotid stenosis is based on velocity parameters that correlate the residual internal carotid diameter with NASCET-based stenosis levels, using the diameter of the distal internal carotid lumen as the denominator for stenosis measurement. The following velocity measurements were obtained: RIGHT ICA:  95/19 cm/sec CCA:  89/12 cm/sec SYSTOLIC ICA/CCA RATIO:  1.0 ECA:  89 cm/sec LEFT ICA:  89/19 cm/sec CCA:  92/11 cm/sec SYSTOLIC ICA/CCA RATIO:  0.9 ECA:  103 cm/sec RIGHT CAROTID ARTERY: Minimal plaque in the proximal right ICA. Estimated right ICA stenosis is less than 50%. RIGHT VERTEBRAL ARTERY: Antegrade flow with normal waveform and velocity. LEFT CAROTID ARTERY:  No evidence of left ICA plaque or stenosis. LEFT VERTEBRAL ARTERY: Antegrade flow with normal waveform and velocity. IMPRESSION: Minimal plaque in the proximal right ICA with estimated less than 50% right ICA stenosis. No evidence of left ICA plaque or stenosis. Electronically Signed   By: Irish LackGlenn  Yamagata M.D.   On: 04/11/2019 07:59   Dg Chest Portable 1 View  Result Date: 04/10/2019 CLINICAL DATA:  Pt arrives via EMS from home after having 2 at the sites of vomiting since 7 AM- pt also has AMS, pt normally able to hold conversation and is unable to now- pt blood pressure reported to be high, history of stroke, hypertension, former smoker EXAM: PORTABLE CHEST 1 VIEW COMPARISON:  03/09/2018 FINDINGS: Heart size is normal. There is new dense opacity at the LEFT lung base, consistent with significant atelectasis or consolidation. Biapical pleuroparenchymal changes are again noted. Chronic changes in both shoulders. IMPRESSION: New LEFT lower lobe infiltrate  or atelectasis. Electronically Signed   By: Norva PavlovElizabeth  Brown M.D.   On: 04/10/2019 15:39     Assessment/Plan: "Lesia SagoRobert Nienaber  is a 85 y.o. Caucasian male with a known history of hypertension and CVA with residual right-sided weakness, who presented to the emergency room with acute onset of altered status with associated worsening right-sided weakness and blurred vision with diplopia. In whom neuro exam shows right sided hemiparesis and MRI with 7 mm acute infarct within the midline pons slightly eccentric to the left.  RECS: - Neuro protectives measures while admityted including normothermia, normoglycemia, correct electrolytes/metabolic abnlities, treat any infection - Stroke w/up including echo/US - PT/OT - swallow eval - ASA/statin if no CI and not on it. - dvt prophylaxis - Will follow up with you   04/12/2019, 11:51 AM

## 2019-04-13 DIAGNOSIS — I10 Essential (primary) hypertension: Secondary | ICD-10-CM

## 2019-04-13 LAB — URINE CULTURE: Culture: >=100000 — AB

## 2019-04-13 MED ORDER — AMLODIPINE BESYLATE 5 MG PO TABS
5.0000 mg | ORAL_TABLET | Freq: Every day | ORAL | Status: DC
  Administered 2019-04-13: 5 mg via ORAL
  Filled 2019-04-13: qty 1

## 2019-04-13 MED ORDER — ASPIRIN 81 MG PO TBEC: 81.0000 mg | DELAYED_RELEASE_TABLET | Freq: Every day | ORAL | 1 refills | Status: AC

## 2019-04-13 MED ORDER — AMLODIPINE BESYLATE 5 MG PO TABS: 5.0000 mg | ORAL_TABLET | Freq: Every day | ORAL | 2 refills | Status: AC

## 2019-04-13 MED ORDER — LABETALOL HCL 5 MG/ML IV SOLN
10.0000 mg | INTRAVENOUS | Status: DC | PRN
  Administered 2019-04-13: 10 mg via INTRAVENOUS
  Filled 2019-04-13: qty 4

## 2019-04-13 NOTE — Progress Notes (Signed)
Physical Therapy Treatment Patient Details Name: Matthew Greer MRN: [id number] DOB: 05/30/1926 Today's Date: 04/13/2019    History of Present Illness 85 y.o male who was admitted to [facility] with worsening right sided weakness, and visual changes with bluriness, and diplopia. Pt. is very HOH and requires large print text for communication. PMHx includes: hypertension and CVA with residual right sided weakness. Imaging revealed an Acute pontine infarct. Pt. presents also with AMS, Obstructive uropathy, AKI, HTN, and HLD.    PT Comments    Pt was seen for mobility and strengthening, noted better control of standing with two person assist and hemiwalker.  Pt is taking some steps with careful cues, and nursing was able to follow up as well with hemiwalker in room.  PT is still recommending SNF but his partner plans to take him home.  Has all equipment but hemiwalker, and nursing aware of this.  Follow up as needed tomorrow unless pt is going home.   Follow Up Recommendations  SNF     Equipment Recommendations  Other (comment)(hemiwalker)    Recommendations for Other Services       Precautions / Restrictions Precautions Precautions: Fall Precaution Comments: R hemiparesis Restrictions Weight Bearing Restrictions: No    Mobility  Bed Mobility Overal bed mobility: Needs Assistance Bed Mobility: Supine to Sit     Supine to sit: Mod assist        Transfers Overall transfer level: Needs assistance Equipment used: 2 person hand held assist;Hemi-walker Transfers: Sit to/from Stand Sit to Stand: Mod assist;From elevated surface         General transfer comment: mod to stand from side of bed with pt's partner, with hemiwalker on LUE side and partner on R side to be sure he is instructed  Ambulation/Gait Ambulation/Gait assistance: Min assist;+2 physical assistance;+2 safety/equipment Gait Distance (Feet): 6 Feet Assistive device: Hemi-walker;2 person hand held assist Gait  Pattern/deviations: Step-to pattern;Decreased stride length;Decreased stance time - right;Decreased weight shift to right;Wide base of support Gait velocity: reduced Gait velocity interpretation: <1.8 ft/sec, indicate of risk for recurrent falls General Gait Details: short careful steps with hemiwalker and pt following instructions    Stairs             Wheelchair Mobility    Modified Rankin (Stroke Patients Only)       Balance Overall balance assessment: Needs assistance Sitting-balance support: Feet supported;Single extremity supported Sitting balance-Leahy Scale: Fair     Standing balance support: Single extremity supported Standing balance-Leahy Scale: Poor Standing balance comment: stood with hemiwalker and assist from partner, but PT also for safety                            Cognition Arousal/Alertness: Awake/alert Behavior During Therapy: WFL for tasks assessed/performed Overall Cognitive Status: Difficult to assess                                 General Comments: HOH and tired, distracted      Exercises      General Comments General comments (skin integrity, edema, etc.): pt was seen for mobility to chair and progression of gait with better tolerance and improved standing balance to get to chair      Pertinent Vitals/Pain Pain Assessment: No/denies pain    Home Living  Prior Function            PT Goals (current goals can now be found in the care plan section) Acute Rehab PT Goals Patient Stated Goal: To regain independence PT Goal Formulation: With family Progress towards PT goals: Progressing toward goals    Frequency    7X/week      PT Plan Current plan remains appropriate    Co-evaluation              AM-PAC PT "6 Clicks" Mobility   Outcome Measure  Help needed turning from your back to your side while in a flat bed without using bedrails?: A Little Help needed  moving from lying on your back to sitting on the side of a flat bed without using bedrails?: A Little Help needed moving to and from a bed to a chair (including a wheelchair)?: A Lot Help needed standing up from a chair using your arms (e.g., wheelchair or bedside chair)?: A Lot Help needed to walk in hospital room?: A Lot Help needed climbing 3-5 steps with a railing? : Total 6 Click Score: 13    End of Session Equipment Utilized During Treatment: Gait belt Activity Tolerance: Patient tolerated treatment well Patient left: in chair;with call bell/phone within reach;with chair alarm set Nurse Communication: Mobility status PT Visit Diagnosis: Unsteadiness on feet (R26.81);Muscle weakness (generalized) (M62.81);Hemiplegia and hemiparesis Hemiplegia - Right/Left: Right Hemiplegia - dominant/non-dominant: Dominant Hemiplegia - caused by: Cerebral infarction     Time: 6073-7106 PT Time Calculation (min) (ACUTE ONLY): 30 min  Charges:  $Gait Training: 8-22 mins $Therapeutic Activity: 8-22 mins                    [name] 04/13/2019, 5:35 PM   [name], PT MS Acute Rehab Dept. Number: [place]

## 2019-04-13 NOTE — Progress Notes (Signed)
11/8 Patient states the he is feeling better,trying to get into chair to eat breakfast. Called his nurse to help him sit on chair States his diplopia is getting better  Neuro exam essentially the same, remains stable and unchanged Patient is alert, awake, speech dysarthric, follows commands, oriented x3 PERLA, EOMI, no nystagmus noted, VFF seems full, right naso labial flattening( old finding per patient), face sensation to touch seems intact, uvula/tongue midline Motor: he is 1/5 on RUEx. 2/5 on RLEX ( old finding by patient). He is 5/5 on LUEX/LLEX No sensory deficit appreciated No coordination deficit appreciated DTrs and gait not checked at time of evaluation  RECS: Neuro protectives measures while admitted including normothermia, normoglycemia, correct electrolytes/metabolic abnlities, treat any infection - PT appreciate recs and help - OT. Appreciate recs and help. Might need diplopia glass - Continue ASA/Pavix/statin - Appreciate Urology help and recs - dvt prophylaxis - Discussed with hospitalist - Patient neurologically stable: Neurology will sign off. Please call for any questions/concern/new neurological findings or deterioration   11/07 Patient states the he is feeling better, sitting on the chair States he is seeing doble '2 picture sided to side"  US carotid: 11/6 see EMR  Echo pending  Neuro exam: Patient is laert, awake, speech dysarthric, follows commands, oriented x3 PERLA< EOMI, no nystagmus noted, VFF seems full, right naso labial flattening( old finding per patient), face sensation to touch seems intact, uvula/tongue midline Motor: he is 1/5 on RUEx. 2/5 on RLEX ( old finding by patient). He is 5/5 on LUEX/LLEX  No sensory deficit appreciated No coordination deficit appreciated DTrs and gait not checked at time of evaluation  RECS: Neuro protectives measures while admityted including normothermia, normoglycemia, correct electrolytes/metabolic abnlities,  treat any infection - Stroke w/up including echo/US. Will f/up - PT appreciate recs and help - OT. Appreciate recs and help. Might need diplopia glass - swallow eval - Appreciate Urology help and recs - ASA/statin if no CI and not on it. - dvt prophylaxis     INITIAL CONSUlLT 11/06 Reason for Consult:Stroke Referring Physician: Allena Katz. MD   Hard hearing patient, dysrathric  Very challenging interview to conduct. No hearing aids at bedside HPI:   Per note   "Matthew Greer  is a 85 y.o. Caucasian male with a known history of hypertension and CVA with residual right-sided weakness, who presented to the emergency room with acute onset of altered status with associated worsening right-sided weakness and blurred vision with diplopia.  The patient has been having generalized weakness and had nausea and vomiting with reported abdominal pain.  He was having altered mental status and was therefore a poor historian during my interview.  No reported bilious vomitus or hematemesis.  No reported fever or chills or headache or dizziness or blurred vision.  He had prostatic tenderness during rectal exam by the ER physician  Upon presentation to the emergency room, blood pressure was elevated 191/91 with otherwise normal vital signs.  Labs revealed borderline potassium of 3.5, blood glucose of 131 and unremarkable CBC.  UA was positive for UTI.  Her COVID-19 test is currently pending.  Blood cultures were drawn.  The patient was given a gram of IV Rocephin as well as 4 mg of IV with Zofran and 500 mill IV normal saline bolus.  He will be admitted to a medical monitored bed for further evaluation and management.  Head ct 11/5: No acute intracranial abnormality.Evolution of previous infarct from 2017 in the left interna capsule.  MRI 11/5:  7 mm acute infarct within the midline pons slightly eccentric to the left.   Past Medical History:  Diagnosis Date  . Hypertension   . Stroke [facility]) 2013    "minor" - no deficits    Past Surgical History:  Procedure Laterality Date  . ESOPHAGEAL DILATION  02/24/2016   Procedure: ESOPHAGEAL DILATION;  Surgeon: Lucilla Lame, MD;  Location: [place];  Service: Endoscopy;;  . ESOPHAGOGASTRODUODENOSCOPY (EGD) WITH PROPOFOL N/A 02/24/2016   Procedure: ESOPHAGOGASTRODUODENOSCOPY (EGD) WITH PROPOFOL;  Surgeon: Lucilla Lame, MD;  Location: [place];  Service: Endoscopy;  Laterality: N/A;  . ESOPHAGOGASTRODUODENOSCOPY ENDOSCOPY  03/2013   Dr. Allen Norris  . THORACOTOMY  1953    No family history on file.  Social History:  reports that he has quit smoking. He has never used smokeless tobacco. He reports that he does not drink alcohol. No history on file for drug.  Allergies  Allergen Reactions  . Finasteride Nausea Only and Other (See Comments)    Reaction:  Confusion   . Lisinopril Other (See Comments)    Reaction:  Confusion    Medications: I have reviewed the patient's current medications.  ROS: Unable to obtain Physical Examination: Blood pressure (!) 173/78, pulse 72, temperature 98.3 F (36.8 C), temperature source Oral, resp. rate 20, height 6\' 2"  (1.88 m), weight 71.5 kg, SpO2 96 %.  Neurologic Examination Alert, awake, dysarthric, seems to follow commands PERLA< EOMI< no nystagmus, right facial, face sensation difficult to assess, uvula/tongue midline Motor 4/5 on LUEX/LLEX, 2/5 on RUEX RLEX spastic' Sensory exam difficult to assess Coordination exam difficult to assess DTRs andgait not checked at ttime of evaluation  No results found for this or any previous visit (from the past 48 hour(s)).  Recent Results (from the past 240 hour(s))  Urine Culture     Status: Abnormal   Collection Time: 04/10/19  5:35 PM   Specimen: Urine, Random  Result Value Ref Range Status   Specimen Description   Final    URINE, RANDOM Performed at [facility], [address] [place], [place] [address]    Special Requests    Final    NONE Performed at [facility], [address] [place], [place] [address]    Culture >=100,000 COLONIES/mL STAPHYLOCOCCUS EPIDERMIDIS (A)  Final   Report Status 04/13/2019 FINAL  Final   Organism ID, Bacteria STAPHYLOCOCCUS EPIDERMIDIS (A)  Final      Susceptibility   Staphylococcus epidermidis - MIC*    CIPROFLOXACIN <=0.5 SENSITIVE Sensitive     GENTAMICIN <=0.5 SENSITIVE Sensitive     NITROFURANTOIN <=16 SENSITIVE Sensitive     OXACILLIN <=0.25 SENSITIVE Sensitive     TETRACYCLINE <=1 SENSITIVE Sensitive     VANCOMYCIN <=0.5 SENSITIVE Sensitive     TRIMETH/SULFA 160 RESISTANT Resistant     CLINDAMYCIN <=0.25 SENSITIVE Sensitive     RIFAMPIN <=0.5 SENSITIVE Sensitive     Inducible Clindamycin NEGATIVE Sensitive     * >=100,000 COLONIES/mL STAPHYLOCOCCUS EPIDERMIDIS  SARS CORONAVIRUS 2 (TAT 6-24 HRS) Nasopharyngeal Nasopharyngeal Swab     Status: None   Collection Time: 04/10/19  6:20 PM   Specimen: Nasopharyngeal Swab  Result Value Ref Range Status   SARS Coronavirus 2 NEGATIVE NEGATIVE Final    Comment: (NOTE) SARS-CoV-2 target nucleic acids are NOT DETECTED. The SARS-CoV-2 RNA is generally detectable in upper and lower respiratory specimens during the acute phase of infection. Negative results do not preclude SARS-CoV-2 infection, do not rule out co-infections with other  pathogens, and should not be used as the sole basis for treatment or other patient management decisions. Negative results must be combined with clinical observations, patient history, and epidemiological information. The expected result is Negative. Fact Sheet for Patients: HairSlick.no Fact Sheet for Healthcare Providers: quierodirigir.com This test is not yet approved or cleared by the [place] FDA and  has been authorized for detection and/or diagnosis of SARS-CoV-2 by FDA under an Emergency Use Authorization (EUA). This EUA  will remain  in effect (meaning this test can be used) for the duration of the COVID-19 declaration under Section 56 4(b)(1) of the Act, 21 U.S.C. section 360bbb-3(b)(1), unless the authorization is terminated or revoked sooner. Performed at [facility], [address], [place], [place] [address]     No results found.   Assessment/Plan: "Matthew Greer  is a 85 y.o. Caucasian male with a known history of hypertension and CVA with residual right-sided weakness, who presented to the emergency room with acute onset of altered status with associated worsening right-sided weakness and blurred vision with diplopia. In whom neuro exam shows right sided hemiparesis and MRI with 7 mm acute infarct within the midline pons slightly eccentric to the left.  RECS: - Neuro protectives measures while admityted including normothermia, normoglycemia, correct electrolytes/metabolic abnlities, treat any infection - Stroke w/up including echo/US - PT/OT - swallow eval - ASA/statin if no CI and not on it. - dvt prophylaxis - Will follow up with you   04/13/2019, 9:28 AM

## 2019-04-13 NOTE — Discharge Summary (Signed)
[facility] [place] [facility]   PATIENT NAME: Matthew Greer    MR#:  [id number]  DATE OF BIRTH:  Apr 02, 1926  DATE OF ADMISSION:  04/10/2019 ADMITTING PHYSICIAN: Hannah Beat, MD  DATE OF DISCHARGE: 04/13/2019  PRIMARY CARE PHYSICIAN: Rolm Gala, MD    ADMISSION DIAGNOSIS:  CVA (cerebral vascular accident) (HCC) [I63.9] Acute cystitis without hematuria [N30.00] AKI (acute kidney injury) (HCC) [N17.9] Altered mental status, unspecified altered mental status type [R41.82] Acute CVA (cerebrovascular accident) (HCC) [I63.9]  DISCHARGE DIAGNOSIS:  Acute Left  Pons CVA Chronic Left UPJ obstruction--has Foley at discharge HTN SECONDARY DIAGNOSIS:   Past Medical History:  Diagnosis Date  . Hypertension   . Stroke [facility]) 2013   "minor" - no deficits    HOSPITAL COURSE:   [name] a85 y.o.Caucasian malewith a known history of hypertension and CVA with residual right-sided weakness, who presented to the emergency room with acute onset of altered status with associated worsening right-sided weakness and blurred visionwith diplopia. The patient has been having generalized weakness and had nausea and vomiting with reported abdominal pain.  1.Acute CVA - left Pons -pt presented with  worsening right-sided hemiparesis(chronic deficit from previous cva) and blurred visionwith diplopia--eye patch -MRI brain 7 mm acute infarct within the midline pons slightly eccentric to the left. - on aspirinwith Plavix.  -bilateral carotid Doppler Minimal plaque in the proximal right ICA with estimated less than 50% right ICA stenosis. No evidence of left ICA plaque or stenosis   -2D echo with bubble study negative -physical/occupation/speech therapy consults appreciated--recommneds rehab. Pt and family wants to take pt home--CM for d/c planning. -continuedon statin therapy   2.UTI with Chronic left UPJ obstructive uropathy - received 3 days of IV  Rocephin--UC  Staph epidermidis. Pt afebrile and no symptoms. Will d/c abxs -pt will discharge with foley catheter per Urology rec and f/u as out pt  3. Mild acute kidney injury that could be on top of stage IIIa chronic kidney disease.  -Creatinine much improved after IVF  4. Hypertensive urgency. Permissive hypertension will be allowed in the setting of evolving CVA.  -metoprolol bid -add low dose amlodipine  5. Dyslipidemia. -continue statin therapy -lipid profile WNL  6. DVT prophylaxis.  -Subcutaneous Lovenox  Overall improving. Ppt will dishcarge to home with hh. Mr Ida Rogue will make f/u appt with urology and PCp  CONSULTS OBTAINED:  Treatment Team:  Thana Farr, MD Sondra Come, MD Marita Snellen, MD  DRUG ALLERGIES:   Allergies  Allergen Reactions  . Finasteride Nausea Only and Other (See Comments)    Reaction:  Confusion   . Lisinopril Other (See Comments)    Reaction:  Confusion    DISCHARGE MEDICATIONS:   Allergies as of 04/13/2019      Reactions   Finasteride Nausea Only, Other (See Comments)   Reaction:  Confusion    Lisinopril Other (See Comments)   Reaction:  Confusion      Medication List    TAKE these medications   acetaminophen 325 MG tablet Commonly known as: TYLENOL Take 325-650 mg by mouth every 6 (six) hours as needed for mild pain, moderate pain, fever or headache.   amLODipine 5 MG tablet Commonly known as: NORVASC Take 1 tablet (5 mg total) by mouth daily.   aspirin 81 MG EC tablet Take 1 tablet (81 mg total) by mouth daily.   atorvastatin 10 MG tablet Commonly known as: LIPITOR Take 10 mg by mouth every evening.   clopidogrel  75 MG tablet Commonly known as: PLAVIX Take 75 mg by mouth daily.   metoprolol tartrate 50 MG tablet Commonly known as: LOPRESSOR Take 1 tablet (50 mg total) by mouth 2 (two) times daily.   multivitamin with minerals Tabs tablet Take 1 tablet by mouth daily.   triamcinolone  cream 0.1 % Commonly known as: KENALOG Apply 1 application topically 2 (two) times daily.   vitamin B-12 1000 MCG tablet Commonly known as: CYANOCOBALAMIN Take 1,000 mcg by mouth daily.       If you experience worsening of your admission symptoms, develop shortness of breath, life threatening emergency, suicidal or homicidal thoughts you must seek medical attention immediately by calling 911 or calling your MD immediately  if symptoms less severe.  You Must read complete instructions/literature along with all the possible adverse reactions/side effects for all the Medicines you take and that have been prescribed to you. Take any new Medicines after you have completely understood and accept all the possible adverse reactions/side effects.   Please note  You were cared for by a hospitalist during your hospital stay. If you have any questions about your discharge medications or the care you received while you were in the hospital after you are discharged, you can call the unit and asked to speak with the hospitalist on call if the hospitalist that took care of you is not available. Once you are discharged, your primary care physician will handle any further medical issues. Please note that NO REFILLS for any discharge medications will be authorized once you are discharged, as it is imperative that you return to your primary care physician (or establish a relationship with a primary care physician if you do not have one) for your aftercare needs so that they can reassess your need for medications and monitor your lab values. Today   SUBJECTIVE   No new complaints. Per Rn Bp elevated  VITAL SIGNS:  Blood pressure (!) 173/78, pulse 72, temperature 98.3 F (36.8 C), temperature source Oral, resp. rate 20, height 6\' 2"  (1.88 m), weight 71.5 kg, SpO2 96 %.  I/O:    Intake/Output Summary (Last 24 hours) at 04/13/2019 0812 Last data filed at 04/13/2019 0657 Gross per 24 hour  Intake 360 ml   Output 1200 ml  Net -840 ml    PHYSICAL EXAMINATION:  GENERAL:  85 y.o.-year-old patient lying in the bed with no acute distress.  EYES: Pupils equal, round, reactive to light and accommodation. No scleral icterus. Extraocular muscles intact.  HEENT: Head atraumatic, normocephalic. Oropharynx and nasopharynx clear. Left eye patch+ NECK:  Supple, no jugular venous distention. No thyroid enlargement, no tenderness.  LUNGS: Normal breath sounds bilaterally, no wheezing, rales,rhonchi or crepitation. No use of accessory muscles of respiration.  CARDIOVASCULAR: S1, S2 normal. No murmurs, rubs, or gallops.  ABDOMEN: Soft, non-tender, non-distended. Bowel sounds present. No organomegaly or mass.  EXTREMITIES: No pedal edema, cyanosis, or clubbing.  NEUROLOGIC: right hemiparesis with diplopia.Sensation intact. Gait not checked.  PSYCHIATRIC: The patient is alert and oriented x 3.  SKIN: No obvious rash, lesion, or ulcer.   DATA REVIEW:   CBC  Recent Labs  Lab 04/11/19 0629  WBC 10.7*  HGB 12.3*  HCT 37.3*  PLT 217    Chemistries  Recent Labs  Lab 04/10/19 1500 04/11/19 0629  NA 141 140  K 3.5 3.7  CL 107 110  CO2 23 22  GLUCOSE 131* 92  BUN 21 20  CREATININE 1.26* 1.19  CALCIUM 9.4 8.2*  AST 22  --   ALT 18  --   ALKPHOS 80  --   BILITOT 1.0  --     Microbiology Results   Recent Results (from the past 240 hour(s))  Urine Culture     Status: Abnormal (Preliminary result)   Collection Time: 04/10/19  5:35 PM   Specimen: Urine, Random  Result Value Ref Range Status   Specimen Description   Final    URINE, RANDOM Performed at [facility], [address], [place], [place] [address]    Special Requests   Final    NONE Performed at [facility], [address], [place], [place] [address]    Culture (A)  Final    >=100,000 COLONIES/mL STAPHYLOCOCCUS EPIDERMIDIS SUSCEPTIBILITIES TO FOLLOW Performed at [facility], [address]  [place], [place] [address]    Report Status PENDING  Incomplete  SARS CORONAVIRUS 2 (TAT 6-24 HRS) Nasopharyngeal Nasopharyngeal Swab     Status: None   Collection Time: 04/10/19  6:20 PM   Specimen: Nasopharyngeal Swab  Result Value Ref Range Status   SARS Coronavirus 2 NEGATIVE NEGATIVE Final    Comment: (NOTE) SARS-CoV-2 target nucleic acids are NOT DETECTED. The SARS-CoV-2 RNA is generally detectable in upper and lower respiratory specimens during the acute phase of infection. Negative results do not preclude SARS-CoV-2 infection, do not rule out co-infections with other pathogens, and should not be used as the sole basis for treatment or other patient management decisions. Negative results must be combined with clinical observations, patient history, and epidemiological information. The expected result is Negative. Fact Sheet for Patients: HairSlick.no Fact Sheet for Healthcare Providers: quierodirigir.com This test is not yet approved or cleared by the [place] FDA and  has been authorized for detection and/or diagnosis of SARS-CoV-2 by FDA under an Emergency Use Authorization (EUA). This EUA will remain  in effect (meaning this test can be used) for the duration of the COVID-19 declaration under Section 56 4(b)(1) of the Act, 21 U.S.C. section 360bbb-3(b)(1), unless the authorization is terminated or revoked sooner. Performed at [facility], [address], [place], [place] [address]     RADIOLOGY:  No results found.   CODE STATUS:     Code Status Orders  (From admission, onward)         Start     Ordered   04/10/19 1942  Full code  Continuous     04/10/19 1950        Code Status History    Date Active Date Inactive Code Status Order ID Comments User Context   08/15/2016 1810 08/18/2016 1740 Full Code [id number]  Starleen Arms, MD Inpatient   05/25/2016 2225 05/28/2016 1550 Full Code [id number]   Auburn Bilberry, MD Inpatient   Advance Care Planning Activity      TOTAL TIME TAKING CARE OF THIS PATIENT: *40* minutes.    Enedina Finner M.D on 04/13/2019 at 8:12 AM  Between 7am to 6pm - Pager - [phone] After 6pm go to [email or web address] - password [email or web address]  [facility]    CC: Primary care physician; Rolm Gala, MD

## 2019-04-13 NOTE — Plan of Care (Signed)
  Problem: Education: Goal: Knowledge of General Education information will improve Description: Including pain rating scale, medication(s)/side effects and non-pharmacologic comfort measures Outcome: Progressing   Problem: Health Behavior/Discharge Planning: Goal: Ability to manage health-related needs will improve Outcome: Progressing   Problem: Clinical Measurements: Goal: Ability to maintain clinical measurements within normal limits will improve Outcome: Progressing Goal: Will remain free from infection Outcome: Progressing Goal: Diagnostic test results will improve Outcome: Progressing Goal: Respiratory complications will improve Outcome: Progressing Goal: Cardiovascular complication will be avoided Outcome: Progressing   Problem: Activity: Goal: Risk for activity intolerance will decrease Outcome: Progressing   Problem: Nutrition: Goal: Adequate nutrition will be maintained Outcome: Progressing   Problem: Coping: Goal: Level of anxiety will decrease Outcome: Progressing   Problem: Elimination: Goal: Will not experience complications related to bowel motility Outcome: Progressing Goal: Will not experience complications related to urinary retention Outcome: Progressing   Problem: Pain Managment: Goal: General experience of comfort will improve Outcome: Progressing   Problem: Safety: Goal: Ability to remain free from injury will improve Outcome: Progressing   Problem: Skin Integrity: Goal: Risk for impaired skin integrity will decrease Outcome: Progressing   Problem: Education: Goal: Knowledge of disease or condition will improve Outcome: Progressing Goal: Knowledge of secondary prevention will improve Outcome: Progressing Goal: Knowledge of patient specific risk factors addressed and post discharge goals established will improve Outcome: Progressing   Problem: Coping: Goal: Will verbalize positive feelings about self Outcome: Progressing Goal: Will  identify appropriate support needs Outcome: Progressing   Problem: Health Behavior/Discharge Planning: Goal: Ability to manage health-related needs will improve Outcome: Progressing   Problem: Self-Care: Goal: Ability to participate in self-care as condition permits will improve Outcome: Progressing Goal: Verbalization of feelings and concerns over difficulty with self-care will improve Outcome: Progressing   Problem: Ischemic Stroke/TIA Tissue Perfusion: Goal: Complications of ischemic stroke/TIA will be minimized Outcome: Progressing

## 2019-04-13 NOTE — TOC Progression Note (Signed)
Transition of Care [facility]) - Discharge Note    Patient Details  Name: Matthew Greer MRN: [id number] Date of Birth: 1926/01/23  Transition of [facility] CM/SW Contact  Matthew Greer, [place], [place] Phone Number: 04/13/2019, 12:29 PM  Clinical Narrative:    Patient to discharge home today with [name]. Matthew Greer will  transport  patient home upon discharge. Patient has an aid through [employer] that comes from 9am-11am daily to assist patient with his ADL's.   [place] services arranged through [facility] (PT, OT, RN, aid). Patient's significant other had a question about where to obtain thick it. Phone call made to [facility] pharmacy who confirmed that thick it is sold over the counter, no prescription needed.  Matthew Palla, LCSW Clinical Social Work [phone]     Expected Discharge Plan: [place] Barriers to Discharge: Continued Medical Work up  Expected Discharge Plan and Services Expected Discharge Plan: [place] In-house Referral: Clinical Social Work Discharge Planning Services: CM Consult Post Acute Care Choice: Durable Medical Equipment, Home Health Living arrangements for the past 2 months: Single Family Home Expected Discharge Date: 04/13/19                         HH Arranged: PT, OT, Refused SNF, Nurse's Aide           Social Determinants of Health (SDOH) Interventions    Readmission Risk Interventions No flowsheet data found.

## 2019-04-13 NOTE — Plan of Care (Signed)
  Problem: Education: Goal: Knowledge of General Education information will improve Description: Including pain rating scale, medication(s)/side effects and non-pharmacologic comfort measures Outcome: Adequate for Discharge   Problem: Health Behavior/Discharge Planning: Goal: Ability to manage health-related needs will improve Outcome: Adequate for Discharge   Problem: Clinical Measurements: Goal: Ability to maintain clinical measurements within normal limits will improve Outcome: Adequate for Discharge Goal: Will remain free from infection Outcome: Adequate for Discharge Goal: Diagnostic test results will improve Outcome: Adequate for Discharge Goal: Respiratory complications will improve Outcome: Adequate for Discharge Goal: Cardiovascular complication will be avoided Outcome: Adequate for Discharge   Problem: Activity: Goal: Risk for activity intolerance will decrease Outcome: Adequate for Discharge   Problem: Nutrition: Goal: Adequate nutrition will be maintained Outcome: Adequate for Discharge   Problem: Coping: Goal: Level of anxiety will decrease Outcome: Adequate for Discharge   Problem: Elimination: Goal: Will not experience complications related to bowel motility Outcome: Adequate for Discharge Goal: Will not experience complications related to urinary retention Outcome: Adequate for Discharge   Problem: Pain Managment: Goal: General experience of comfort will improve Outcome: Adequate for Discharge   Problem: Safety: Goal: Ability to remain free from injury will improve Outcome: Adequate for Discharge   Problem: Skin Integrity: Goal: Risk for impaired skin integrity will decrease Outcome: Adequate for Discharge   Problem: Education: Goal: Knowledge of disease or condition will improve Outcome: Adequate for Discharge Goal: Knowledge of secondary prevention will improve Outcome: Adequate for Discharge Goal: Knowledge of patient specific risk factors  addressed and post discharge goals established will improve Outcome: Adequate for Discharge   Problem: Coping: Goal: Will verbalize positive feelings about self Outcome: Adequate for Discharge Goal: Will identify appropriate support needs Outcome: Adequate for Discharge   Problem: Health Behavior/Discharge Planning: Goal: Ability to manage health-related needs will improve Outcome: Adequate for Discharge   Problem: Self-Care: Goal: Ability to participate in self-care as condition permits will improve Outcome: Adequate for Discharge Goal: Verbalization of feelings and concerns over difficulty with self-care will improve Outcome: Adequate for Discharge   Problem: Ischemic Stroke/TIA Tissue Perfusion: Goal: Complications of ischemic stroke/TIA will be minimized Outcome: Adequate for Discharge

## 2019-04-23 ENCOUNTER — Ambulatory Visit: Payer: Medicare Other | Admitting: Physician Assistant

## 2019-04-24 ENCOUNTER — Encounter: Payer: Self-pay | Admitting: Physician Assistant

## 2019-04-24 ENCOUNTER — Ambulatory Visit: Payer: Medicare Other | Admitting: Physician Assistant

## 2019-04-24 ENCOUNTER — Ambulatory Visit (INDEPENDENT_AMBULATORY_CARE_PROVIDER_SITE_OTHER): Payer: Medicare Other | Admitting: Physician Assistant

## 2019-04-24 ENCOUNTER — Other Ambulatory Visit: Payer: Self-pay

## 2019-04-24 VITALS — BP 166/70 | HR 73 | Ht 74.0 in | Wt 150.0 lb

## 2019-04-24 DIAGNOSIS — R339 Retention of urine, unspecified: Secondary | ICD-10-CM

## 2019-04-24 LAB — BLADDER SCAN AMB NON-IMAGING

## 2019-04-24 NOTE — Progress Notes (Signed)
Fill and Pull Catheter Removal  Patient is present today for a catheter removal.  Patient was cleaned and prepped in a sterile fashion 270ml of sterile saline was instilled into the bladder when the patient felt the urge to urinate. 31ml of water was then drained from the balloon.  A 16FR foley cath was removed from the bladder no complications were noted .  Patient was then given some time to void on their own.  Patient can void  42ml on their own after some time.  Patient tolerated well.  Performed by: Debroah Loop, PA-C   Follow up/ Additional notes: Patient to attempt to urinate at home and return this afternoon for PVR.   Afternoon PVR  Results for orders placed or performed in visit on 04/24/19  Bladder Scan (Post Void Residual) in office  Result Value Ref Range   Scan Result 115ml    Patient return to the clinic this afternoon for repeat PVR.  Bladder scan with 175 mL.  He reports he has urinated twice at home and believes he passed a significant volume each time.  Will defer catheter replacement today.  Would like patient to return tomorrow for repeat PVR to establish stability of a slightly elevated PVR.  Debroah Loop, PA-C 04/24/19 3:06 PM

## 2019-04-25 ENCOUNTER — Ambulatory Visit: Payer: Medicare Other

## 2019-04-25 ENCOUNTER — Ambulatory Visit (INDEPENDENT_AMBULATORY_CARE_PROVIDER_SITE_OTHER): Payer: Medicare Other | Admitting: Family Medicine

## 2019-04-25 ENCOUNTER — Other Ambulatory Visit: Payer: Self-pay

## 2019-04-25 DIAGNOSIS — R339 Retention of urine, unspecified: Secondary | ICD-10-CM

## 2019-04-25 LAB — BLADDER SCAN AMB NON-IMAGING: Scan Result: 265

## 2019-04-25 MED ORDER — TAMSULOSIN HCL 0.4 MG PO CAPS: 0.4000 mg | ORAL_CAPSULE | Freq: Every day | ORAL | 0 refills | Status: DC

## 2019-04-25 NOTE — Progress Notes (Signed)
Patient presents today for a bladder scan for urinary retention. PVR results are 258ml today. Sam was notified and a RX for Tamsulosin was sent to the pharmacy. He is to retrurn to the office next week for a PVR.

## 2019-05-06 ENCOUNTER — Encounter: Payer: Self-pay | Admitting: Physician Assistant

## 2019-05-06 ENCOUNTER — Ambulatory Visit (INDEPENDENT_AMBULATORY_CARE_PROVIDER_SITE_OTHER): Payer: Medicare Other | Admitting: Physician Assistant

## 2019-05-06 ENCOUNTER — Other Ambulatory Visit: Payer: Self-pay

## 2019-05-06 VITALS — BP 124/74 | HR 78 | Ht 72.0 in | Wt 150.0 lb

## 2019-05-06 DIAGNOSIS — R339 Retention of urine, unspecified: Secondary | ICD-10-CM

## 2019-05-06 LAB — BLADDER SCAN AMB NON-IMAGING: Scan Result: 358

## 2019-05-06 NOTE — Progress Notes (Signed)
05/06/2019 2:18 PM   Matthew Greer 12/23/1925 [id number]  CC: Follow-up PVR on tamsulosin  HPI: Matthew Greer is a 85 y.o. male who presents today for follow-up PVR. He is an established [facility] patient who last saw me on 04/24/2019 for voiding trial following an episode of acute urinary retention while hospitalized for CVA. Afternoon PVR was that day, with repeat scan the next morning of . He was started on Flomax at that time and advised to come back in one week for repeat PVR.  PVR today . He does not feel the urge to urinate right now. He reports some urinary incontinence, especially at night.  PMH: Past Medical History:  Diagnosis Date  . Hypertension   . Stroke [facility]) 2013   "minor" - no deficits    Surgical History: Past Surgical History:  Procedure Laterality Date  . ESOPHAGEAL DILATION  02/24/2016   Procedure: ESOPHAGEAL DILATION;  Surgeon: Midge Minium, MD;  Location: [facility];  Service: Endoscopy;;  . ESOPHAGOGASTRODUODENOSCOPY (EGD) WITH PROPOFOL N/A 02/24/2016   Procedure: ESOPHAGOGASTRODUODENOSCOPY (EGD) WITH PROPOFOL;  Surgeon: Midge Minium, MD;  Location: [facility];  Service: Endoscopy;  Laterality: N/A;  . ESOPHAGOGASTRODUODENOSCOPY ENDOSCOPY  03/2013   Dr. Servando Snare  . THORACOTOMY  1953    Home Medications:  Allergies as of 05/06/2019      Reactions   Aspirin Other (See Comments)   On plavix.  Has h.o GI bleed.   Finasteride Nausea Only, Other (See Comments)   Reaction:  Confusion    Levofloxacin Other (See Comments)   Lisinopril Other (See Comments)   Reaction:  Confusion      Medication List       Accurate as of May 06, 2019  2:18 PM. If you have any questions, ask your nurse or doctor.        acetaminophen 325 MG tablet Commonly known as: TYLENOL Take 325-650 mg by mouth every 6 (six) hours as needed for mild pain, moderate pain, fever or headache.   amLODipine 5 MG tablet Commonly known as: NORVASC Take 1  tablet (5 mg total) by mouth daily.   aspirin 81 MG EC tablet Take 1 tablet (81 mg total) by mouth daily.   atorvastatin 10 MG tablet Commonly known as: LIPITOR Take 10 mg by mouth every evening.   clopidogrel 75 MG tablet Commonly known as: PLAVIX Take 75 mg by mouth daily.   metoprolol tartrate 50 MG tablet Commonly known as: LOPRESSOR Take 1 tablet (50 mg total) by mouth 2 (two) times daily.   multivitamin with minerals Tabs tablet Take 1 tablet by mouth daily.   tamsulosin 0.4 MG Caps capsule Commonly known as: FLOMAX Take 1 capsule (0.4 mg total) by mouth daily.   triamcinolone cream 0.1 % Commonly known as: KENALOG Apply 1 application topically 2 (two) times daily.   vitamin B-12 1000 MCG tablet Commonly known as: CYANOCOBALAMIN Take 1,000 mcg by mouth daily.       Allergies:  Allergies  Allergen Reactions  . Aspirin Other (See Comments)    On plavix.  Has h.o GI bleed.  . Finasteride Nausea Only and Other (See Comments)    Reaction:  Confusion   . Levofloxacin Other (See Comments)  . Lisinopril Other (See Comments)    Reaction:  Confusion    Family History: No family history on file.  Social History:   reports that he has quit smoking. He has never used smokeless tobacco. He reports that he  does not drink alcohol. No history on file for drug.  ROS: UROLOGY Frequent Urination?: Yes Hard to postpone urination?: Yes Burning/pain with urination?: No Get up at night to urinate?: No Leakage of urine?: No Urine stream starts and stops?: No Trouble starting stream?: No Do you have to strain to urinate?: No Blood in urine?: No Urinary tract infection?: No Sexually transmitted disease?: No Injury to kidneys or bladder?: No Painful intercourse?: No Weak stream?: No Erection problems?: No Penile pain?: No  Gastrointestinal Nausea?: No Vomiting?: No Indigestion/heartburn?: No Diarrhea?: No Constipation?: No  Constitutional Fever: No Night  sweats?: No Weight loss?: No Fatigue?: No  Skin Skin rash/lesions?: No Itching?: No  Eyes Blurred vision?: No Double vision?: No  Ears/Nose/Throat Sore throat?: No Sinus problems?: No  Hematologic/Lymphatic Swollen glands?: No Easy bruising?: No  Cardiovascular Leg swelling?: No Chest pain?: No  Respiratory Cough?: No Shortness of breath?: No  Endocrine Excessive thirst?: No  Musculoskeletal Back pain?: No Joint pain?: No  Neurological Headaches?: No Dizziness?: No  Psychologic Depression?: No Anxiety?: No  Physical Exam: BP 124/74   Pulse 78   Ht 6' (1.829 m)   Wt 150 lb (68 kg)   BMI 20.34 kg/m   Constitutional:  Alert and oriented, no acute distress, nontoxic appearing HEENT: [place], AT Cardiovascular: No clubbing, cyanosis, or edema Respiratory: Normal respiratory effort, no increased work of breathing Skin: No rashes, bruises or suspicious lesions Neurologic: Right-sided hemiparesis, right hand contracted.  Shuffling gait, requires standby assist for ambulation.  Psychiatric: Normal mood and affect  Laboratory Data: Results for orders placed or performed in visit on 05/06/19  Bladder Scan (Post Void Residual) in office  Result Value Ref Range   Scan Result 358    Assessment & Plan:   1. Urinary retention 85 year old male with acute urinary retention during hospitalization for CVA with right-sided weakness here for repeat PVR following voiding trial.  Residuals continues to rise despite initiation of Flomax with patient lacking sensation of bladder fullness.    I suspect a component of neurogenic bladder contributing to his current picture and I am concerned by the progressive rise in his bladder scan volumes despite therapy.  I explained to the patient that I am concerned that a failure to intervene may lead to another acute retention episode.  I discussed the role of CIC in keeping his bladder drained long-term despite an adequate bladder  sensation.  He is willing to try this.  I instructed him in CIC today, procedure notes below.  I clarified that this should augment his toileting regimen and that he should continue to void as needed despite CIC.  Patient to continue Flomax and follow-up with me in 3 months.  He expressed understanding.  Continuous Intermittent Catheterization Due to incomplete bladder emptying patient is present today for a teaching of self I & O Catheterization. Patient was given detailed verbal and printed instructions of self catheterization. Patient was cleaned and prepped in a clean fashion.  With instruction and assistance patient inserted a 14FR coude [employer] SpeediCath Standard and urine return was noted 325 ml, urine was cloudy yellow in color. Patient tolerated well, no complications were noted. Patient was given a sample bag with supplies to take home.  Instructions were given for patient to cath 1 time daily.  An order was placed with [facility] for catheters to be sent to the patient's home.  Performed by: Debroah Loop, PA-C   - Bladder Scan (Post Void Residual) in office  Return in  about 3 months (around 08/04/2019) for Retention f/u.  Carman Ching, PA-C  [facility] [address] [place], [place] [address] [phone]

## 2019-05-08 ENCOUNTER — Ambulatory Visit: Payer: Medicare Other

## 2019-05-12 ENCOUNTER — Other Ambulatory Visit: Payer: Self-pay | Admitting: Nephrology

## 2019-05-12 DIAGNOSIS — N179 Acute kidney failure, unspecified: Secondary | ICD-10-CM

## 2019-05-12 DIAGNOSIS — N1831 Chronic kidney disease, stage 3a: Secondary | ICD-10-CM

## 2019-05-19 ENCOUNTER — Ambulatory Visit
Admission: RE | Admit: 2019-05-19 | Discharge: 2019-05-19 | Disposition: A | Discharge status: Home / Self Care | Payer: Medicare Other | Source: Ambulatory Visit | Attending: Nephrology | Admitting: Nephrology

## 2019-05-19 ENCOUNTER — Other Ambulatory Visit: Payer: Self-pay

## 2019-05-19 DIAGNOSIS — N179 Acute kidney failure, unspecified: Secondary | ICD-10-CM | POA: Diagnosis present

## 2019-05-19 DIAGNOSIS — N1831 Chronic kidney disease, stage 3a: Secondary | ICD-10-CM | POA: Diagnosis present

## 2019-06-03 ENCOUNTER — Telehealth: Payer: Self-pay

## 2019-06-03 NOTE — Telephone Encounter (Signed)
Patient's wife called stating patient is seeing some blood with CIC. She was reassured that this can happen with large prostates and as long as it was not a large amount or large clots it was ok. Should the amount increase she should call back for further evaluation

## 2019-06-04 ENCOUNTER — Telehealth: Payer: Self-pay | Admitting: Urology

## 2019-06-04 NOTE — Telephone Encounter (Signed)
Matthew Greer needs to self cath indefinitely with a Coude catheter due to inability to pass a straight catheter.

## 2019-06-20 ENCOUNTER — Telehealth: Payer: Self-pay | Admitting: Family Medicine

## 2019-06-20 NOTE — Telephone Encounter (Signed)
Dr. Laurey Morale office called and wanted you to be aware that the RUS that had done shows moderate to severe Hydro. Please advise.

## 2019-06-20 NOTE — Telephone Encounter (Signed)
Sam saw this patient in the hospital for [place].  I would let her know.  I don't know if there is much for [place] to do there since it looks like the right hydro has been present for several years.

## 2019-06-20 NOTE — Telephone Encounter (Signed)
Nothing to do.

## 2019-06-20 NOTE — Telephone Encounter (Signed)
Patient has a known history of chronic right UPJ obstruction with stable creatinine.Marland Kitchen

## 2019-06-22 DIAGNOSIS — H9193 Unspecified hearing loss, bilateral: Secondary | ICD-10-CM

## 2019-07-15 ENCOUNTER — Other Ambulatory Visit: Payer: Self-pay | Admitting: Student in an Organized Health Care Education/Training Program

## 2019-07-21 ENCOUNTER — Emergency Department: Payer: Medicare Other

## 2019-07-21 ENCOUNTER — Other Ambulatory Visit: Payer: Self-pay

## 2019-07-21 ENCOUNTER — Emergency Department
Admission: EM | Admit: 2019-07-21 | Discharge: 2019-07-21 | Disposition: A | Discharge status: Home / Self Care | Payer: Medicare Other | Attending: Student | Admitting: Student

## 2019-07-21 DIAGNOSIS — Z87891 Personal history of nicotine dependence: Secondary | ICD-10-CM | POA: Insufficient documentation

## 2019-07-21 DIAGNOSIS — I10 Essential (primary) hypertension: Secondary | ICD-10-CM | POA: Insufficient documentation

## 2019-07-21 DIAGNOSIS — Y999 Unspecified external cause status: Secondary | ICD-10-CM | POA: Diagnosis not present

## 2019-07-21 DIAGNOSIS — Y929 Unspecified place or not applicable: Secondary | ICD-10-CM | POA: Diagnosis not present

## 2019-07-21 DIAGNOSIS — W010XXA Fall on same level from slipping, tripping and stumbling without subsequent striking against object, initial encounter: Secondary | ICD-10-CM | POA: Diagnosis not present

## 2019-07-21 DIAGNOSIS — S41111A Laceration without foreign body of right upper arm, initial encounter: Secondary | ICD-10-CM | POA: Diagnosis present

## 2019-07-21 DIAGNOSIS — Z79899 Other long term (current) drug therapy: Secondary | ICD-10-CM | POA: Insufficient documentation

## 2019-07-21 DIAGNOSIS — Z7901 Long term (current) use of anticoagulants: Secondary | ICD-10-CM | POA: Insufficient documentation

## 2019-07-21 DIAGNOSIS — Z7982 Long term (current) use of aspirin: Secondary | ICD-10-CM | POA: Insufficient documentation

## 2019-07-21 DIAGNOSIS — Y939 Activity, unspecified: Secondary | ICD-10-CM | POA: Insufficient documentation

## 2019-07-21 NOTE — Discharge Instructions (Signed)
Continue pressure dressing and return back in 2 days for reevaluation.

## 2019-07-21 NOTE — ED Notes (Signed)
Pt states he slipped out of his wheelchair Saturday and developed a minor lac to the inner aspect of his right arm. Pt had It bandaged at [facility] and was sent home. Dressing has continued to bleed. Per pt, the dressing was changed earlier today. There appears to be some saturation of the bandage and recent bleeding. Pt reports he is on blood thinners

## 2019-07-21 NOTE — ED Triage Notes (Signed)
Pt states he slipped out his wheelchair Saturday and cut his right arm and is on blood thinners, states he was sent here today from [facility], pt arrives with clear pressure dressing.

## 2019-07-21 NOTE — ED Provider Notes (Signed)
[facility] Emergency Department Provider Note   ____________________________________________   First MD Initiated Contact with Patient 07/21/19 1457     (approximate)  I have reviewed the triage vital signs and the nursing notes.   HISTORY  Chief Complaint Laceration    HPI Matthew Greer is a 86 y.o. male patient presents with multiple lacerations of right upper extremity secondary to a slip and fall 2 days ago and he stepped out of a chair causing a laceration to his upper arm.  Patient did purchase a new chair and the cushion was.  Caused him to slip and fall lacerating his right upper extremity.  Patient state bleeding is not controlled and he went to urgent care clinic and he was sent to the emergency room.  Patient is taking Plavix.  Patient denies pain.  Patient status post hemiplegic affecting the right side.  Pressure dressing applied at fast med.  No imaging performed.         Past Medical History:  Diagnosis Date  . Hypertension   . Stroke [facility]) 2013   "minor" - no deficits    Patient Active Problem List   Diagnosis Date Noted  . HTN (hypertension), malignant   . Acute cystitis without hematuria   . AKI (acute kidney injury) ([place])   . Hearing impaired person, bilateral   . Obstructive uropathy 04/10/2019  . Acute CVA (cerebrovascular accident) ([place]) 04/10/2019  . Acute blood loss anemia 08/15/2016  . Hip hematoma, right, initial encounter 08/15/2016  . CVA (cerebral vascular accident) ([place]) 05/25/2016  . Problems with swallowing and mastication   . Stricture and stenosis of esophagus   . Benign essential hypertension 01/27/2016  . Kidney stones 01/27/2016  . Other nonspecific abnormal finding of lung field 01/27/2016  . Pure hypercholesterolemia 01/27/2016  . Stroke ([place]) 01/27/2016  . 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 Rotator cuff tendonitis 11/10/2013  . Hearing loss 04/09/2013  . Eczema 07/26/2012    . Abdominal pain 12/27/2011    Past Surgical History:  Procedure Laterality Date  . ESOPHAGEAL DILATION  02/24/2016   Procedure: ESOPHAGEAL DILATION;  Surgeon: Lucilla Lame, MD;  Location: [place];  Service: Endoscopy;;  . ESOPHAGOGASTRODUODENOSCOPY (EGD) WITH PROPOFOL N/A 02/24/2016   Procedure: ESOPHAGOGASTRODUODENOSCOPY (EGD) WITH PROPOFOL;  Surgeon: Lucilla Lame, MD;  Location: [place];  Service: Endoscopy;  Laterality: N/A;  . ESOPHAGOGASTRODUODENOSCOPY ENDOSCOPY  03/2013   Dr. Allen Norris  . THORACOTOMY  1953    Prior to Admission medications   Medication Sig Start Date End Date Taking? Authorizing Provider  acetaminophen (TYLENOL) 325 MG tablet Take 325-650 mg by mouth every 6 (six) hours as needed for mild pain, moderate pain, fever or headache.     [provider]  amLODipine (NORVASC) 5 MG tablet Take 1 tablet (5 mg total) by mouth daily. 04/13/19   Fritzi Mandes, MD  aspirin EC 81 MG EC tablet Take 1 tablet (81 mg total) by mouth daily. 04/13/19   Fritzi Mandes, MD  atorvastatin (LIPITOR) 10 MG tablet Take 10 mg by mouth every evening.     [provider]  clopidogrel (PLAVIX) 75 MG tablet Take 75 mg by mouth daily.    [provider]  metoprolol (LOPRESSOR) 50 MG tablet Take 1 tablet (50 mg total) by mouth 2 (two) times daily. 05/28/16   Bettey Costa, MD  Multiple Vitamin (MULTIVITAMIN WITH MINERALS) TABS tablet Take 1 tablet by mouth daily.    [provider]  tamsulosin (FLOMAX) 0.4 MG CAPS capsule Take 1 capsule (0.4 mg total) by mouth daily. 04/25/19   Vaillancourt, Lelon Mast, PA-C  triamcinolone cream (KENALOG) 0.1 % Apply 1 application topically 2 (two) times daily.    [provider]  vitamin B-12 (CYANOCOBALAMIN) 1000 MCG tablet Take 1,000 mcg by mouth daily.    [provider]    Allergies Aspirin, Finasteride, Levofloxacin, and Lisinopril,  No family history on file.  Social History Social History    Tobacco Use  . Smoking status: Former Games developer  . Smokeless tobacco: Never Used  Substance Use Topics  . Alcohol use: No  . Drug use: Not on file    Review of Systems  Constitutional: No fever/chills Eyes: No visual changes. ENT: No sore throat. Cardiovascular: Denies chest pain. Respiratory: Denies shortness of breath. Gastrointestinal: No abdominal pain.  No nausea, no vomiting.  No diarrhea.  No constipation. Genitourinary: Negative for dysuria. Musculoskeletal: Negative for back pain. Skin: Negative for rash.  Laceration and skin tears to the right upper extremity. Neurological: Negative for headaches, focal weakness or numbness. Allergic/Immunilogical: Aspirin,Finasteride, Levaquin, and lisinopril. ____________________________________________   PHYSICAL EXAM:  VITAL SIGNS: ED Triage Vitals  Enc Vitals Group     BP 07/21/19 1359 (!) 144/79     Pulse Rate 07/21/19 1359 (!) 59     Resp 07/21/19 1359 18     Temp 07/21/19 1359 97.8 F (36.6 C)     Temp Source 07/21/19 1359 Oral     SpO2 07/21/19 1359 100 %     Weight 07/21/19 1400 150 lb (68 kg)     Height 07/21/19 1400 5\' 11"  (1.803 m)     Head Circumference --      Peak Flow --      Pain Score 07/21/19 1400 0     Pain Loc --      Pain Edu? --      Excl. in [place]? --     Constitutional: Alert and oriented. Well appearing and in no acute distress. Eyes: Conjunctivae are normal. PERRL. EOMI. Head: Atraumatic. Nose: No congestion/rhinnorhea. Mouth/Throat: Mucous membranes are moist.  Oropharynx non-erythematous. Cardiovascular: Normal rate, regular rhythm. Grossly normal heart sounds.  Good peripheral circulation.  Mild hemorrhaging from right upper extremity. Respiratory: Normal respiratory effort.  No retractions. Lungs CTAB. Musculoskeletal: No lower extremity tenderness nor edema.  No joint effusions. Neurologic:  Normal speech and language. No gross focal neurologic deficits are appreciated. No gait  instability. Skin: Skin tear and mild bleeding to the right upper extremity.  Ecchymosis secondary to contusion. Psychiatric: Mood and affect are normal. Speech and behavior are normal.  ____________________________________________   LABS (all labs ordered are listed, but only abnormal results are displayed)  Labs Reviewed - No data to display ____________________________________________  EKG   ____________________________________________  RADIOLOGY  ED MD interpretation:    Official radiology report(s): No results found.  ____________________________________________   PROCEDURES  Procedure(s) performed (including Critical Care):  Procedures   ____________________________________________   INITIAL IMPRESSION / ASSESSMENT AND PLAN / ED COURSE  As part of my medical decision making, I reviewed the following data within the electronic MEDICAL RECORD NUMBER     Patient presents with bleeding secondary to laceration to right upper extremity which occurred 2 days ago.  Discussed rationale for not trying to close the wounds which are skin tears.  Pressure dressings were applied to the area.  Discussed negative x-ray findings with patient.  Advised patient return back in  2 days for wound recheck.    Matthew Greer was evaluated in Emergency Department on 07/21/2019 for the symptoms described in the history of present illness. He was evaluated in the context of the global COVID-19 pandemic, which necessitated consideration that the patient might be at risk for infection with the SARS-CoV-2 virus that causes COVID-19. Institutional protocols and algorithms that pertain to the evaluation of patients at risk for COVID-19 are in a state of rapid change based on information released by regulatory bodies including the CDC and federal and state organizations. These policies and algorithms were followed during the patient's care in the ED.        ____________________________________________   FINAL CLINICAL IMPRESSION(S) / ED DIAGNOSES  Final diagnoses:  Laceration of right upper extremity, initial encounter     ED Discharge Orders    None       Note:  This document was prepared using Dragon voice recognition software and may include unintentional dictation errors.    Joni Reining, PA-C 07/21/19 1639    Miguel Aschoff., MD 07/21/19 [phone]

## 2019-07-23 ENCOUNTER — Other Ambulatory Visit: Payer: Self-pay

## 2019-07-23 ENCOUNTER — Encounter: Payer: Self-pay | Admitting: Emergency Medicine

## 2019-07-23 ENCOUNTER — Emergency Department
Admission: EM | Admit: 2019-07-23 | Discharge: 2019-07-23 | Disposition: A | Discharge status: Home / Self Care | Payer: Medicare Other | Attending: Emergency Medicine | Admitting: Emergency Medicine

## 2019-07-23 DIAGNOSIS — I10 Essential (primary) hypertension: Secondary | ICD-10-CM | POA: Insufficient documentation

## 2019-07-23 DIAGNOSIS — Z4800 Encounter for change or removal of nonsurgical wound dressing: Secondary | ICD-10-CM | POA: Diagnosis present

## 2019-07-23 DIAGNOSIS — Z79899 Other long term (current) drug therapy: Secondary | ICD-10-CM | POA: Insufficient documentation

## 2019-07-23 DIAGNOSIS — Z5189 Encounter for other specified aftercare: Secondary | ICD-10-CM

## 2019-07-23 NOTE — ED Notes (Signed)
Case management contacted regarding getting home health to change pt's dressing. PA states there was an order placed on last visit, however CM explained that order was placed after CM had left for the day and then pt was discharged, so it did not show up on her work list. New order placed.

## 2019-07-23 NOTE — ED Notes (Addendum)
See triage note. Pt states he was seen 07/21/19 in ED for lac/skin tear and told to return today for dressing change. Dressing intact with dried blood to posterior side.

## 2019-07-23 NOTE — ED Triage Notes (Signed)
Patient reports he fell Saturday and received skin tears to right arm. Patient seen in ED and arm bandaged. Told to come back today for wound check and dressing change.

## 2019-07-23 NOTE — ED Notes (Signed)
Dressing applied with restore per PA, covered with abd pad and wrapped loosely with gauze wrap and coban. Pt spouse states they have HH aide already set up with [facility]. CM informed, states she will see if nurse can be added to pt's existing services. Awaiting callback.

## 2019-07-23 NOTE — ED Notes (Signed)
Dressing removed, skin tears still oozing slowly. telfa pad stiff with bloody drainage. Surgicell gauze applied to oozing areas, covered with abd pad and small pieces of tape for easy assessment by PA.

## 2019-07-23 NOTE — Social Work (Signed)
TOC CSW:  Spoke with Matthew Greer nurse Helmut Muster and she stated patient needs wound care at home.  Patient is currently receiving services from [facility], they do not do wound care/home health.  This SW reached out to [facility] [phone] to set up wound care.    Patient discharged this SW will contact patient to explain which agency will be assisting with wound care.

## 2019-07-23 NOTE — Discharge Instructions (Addendum)
Follow-up with your primary care provider and also find out if the [facility] home health provides anyone who can take care of wounds.  The dressing that was placed today needs to stay on for 7 days as this is a skin tear. No evidence of infection was seen.

## 2019-07-23 NOTE — Social Work (Signed)
TOC CSW:  Attempted to contact patient at home to explain home health/wound care process.  Patient discharged before this SW was able to speak with him. Left message awaiting patient return call.

## 2019-07-23 NOTE — ED Provider Notes (Signed)
[facility] Emergency Department Provider Note   ____________________________________________   First MD Initiated Contact with Patient 07/23/19 1147     (approximate)  I have reviewed the triage vital signs and the nursing notes.   HISTORY  Chief Complaint Wound Check   HPI Matthew Greer is a 86 y.o. male presents to the ED for wound recheck and dressing change.  Patient was seen in the ED 2 days ago after he hit his right forearm causing multiple skin tears.  Family member with the patient states that he is on Plavix and continues to have bleeding.  He denies any pain at this time.     Past Medical History:  Diagnosis Date  . Hypertension   . Stroke [facility]) 2013   "minor" - no deficits    Patient Active Problem List   Diagnosis Date Noted  . HTN (hypertension), malignant   . Acute cystitis without hematuria   . AKI (acute kidney injury) ([facility])   . Hearing impaired person, bilateral   . Obstructive uropathy 04/10/2019  . Acute CVA (cerebrovascular accident) ([facility]) 04/10/2019  . Acute blood loss anemia 08/15/2016  . Hip hematoma, right, initial encounter 08/15/2016  . CVA (cerebral vascular accident) ([facility]) 05/25/2016  . Problems with swallowing and mastication   . Stricture and stenosis of esophagus   . Benign essential hypertension 01/27/2016  . Kidney stones 01/27/2016  . Other nonspecific abnormal finding of lung field 01/27/2016  . Pure hypercholesterolemia 01/27/2016  . Stroke ([facility]) 01/27/2016  . 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 Rotator cuff tendonitis 11/10/2013  . Hearing loss 04/09/2013  . Eczema 07/26/2012  . Abdominal pain 12/27/2011    Past Surgical History:  Procedure Laterality Date  . ESOPHAGEAL DILATION  02/24/2016   Procedure: ESOPHAGEAL DILATION;  Surgeon: Midge Minium, MD;  Location: [facility];  Service: Endoscopy;;  . ESOPHAGOGASTRODUODENOSCOPY (EGD) WITH PROPOFOL N/A 02/24/2016   Procedure: ESOPHAGOGASTRODUODENOSCOPY (EGD) WITH PROPOFOL;  Surgeon: Midge Minium, MD;  Location: [facility];  Service: Endoscopy;  Laterality: N/A;  . ESOPHAGOGASTRODUODENOSCOPY ENDOSCOPY  03/2013   Dr. Servando Snare  . THORACOTOMY  1953    Prior to Admission medications   Medication Sig Start Date End Date Taking? Authorizing Provider  acetaminophen (TYLENOL) 325 MG tablet Take 325-650 mg by mouth every 6 (six) hours as needed for mild pain, moderate pain, fever or headache.     [provider]  amLODipine (NORVASC) 5 MG tablet Take 1 tablet (5 mg total) by mouth daily. 04/13/19   Enedina Finner, MD  aspirin EC 81 MG EC tablet Take 1 tablet (81 mg total) by mouth daily. 04/13/19   Enedina Finner, MD  atorvastatin (LIPITOR) 10 MG tablet Take 10 mg by mouth every evening.     [provider]  clopidogrel (PLAVIX) 75 MG tablet Take 75 mg by mouth daily.    [provider]  metoprolol (LOPRESSOR) 50 MG tablet Take 1 tablet (50 mg total) by mouth 2 (two) times daily. 05/28/16   Adrian Saran, MD  Multiple Vitamin (MULTIVITAMIN WITH MINERALS) TABS tablet Take 1 tablet by mouth daily.    [provider]  tamsulosin (FLOMAX) 0.4 MG CAPS capsule Take 1 capsule (0.4 mg total) by mouth daily. 04/25/19   Vaillancourt, Lelon Mast, PA-C  triamcinolone cream (KENALOG) 0.1 % Apply 1 application topically 2 (two) times daily.    [provider]  vitamin B-12 (CYANOCOBALAMIN) 1000 MCG tablet Take 1,000 mcg by mouth daily.  [provider]    Allergies Aspirin, Finasteride, Levofloxacin, and Lisinopril  No family history on file.  Social History Social History   Tobacco Use  . Smoking status: Former Research scientist (life sciences)  . Smokeless tobacco: Never Used  Substance Use Topics  . Alcohol use: No  . Drug use: Not on file    Review of Systems Constitutional: No fever/chills Eyes: No visual changes. ENT: No sore throat. Cardiovascular: Denies chest pain. Respiratory:  Denies shortness of breath. Musculoskeletal: Negative for back pain. Skin: Multiple skin tears right upper extremity.  ____________________________________________   PHYSICAL EXAM:  VITAL SIGNS: ED Triage Vitals [07/23/19 1124]  Enc Vitals Group     BP 139/71     Pulse Rate 65     Resp 16     Temp 97.6 F (36.4 C)     Temp Source Oral     SpO2 99 %     Weight 149 lb 14.6 oz (68 kg)     Height 5\' 11"  (1.803 m)     Head Circumference      Peak Flow      Pain Score 0     Pain Loc      Pain Edu?      Excl. in [place]?     Constitutional: Alert and oriented. Well appearing and in no acute distress. Eyes: Conjunctivae are normal.  Head: Atraumatic. Neck: No stridor.   Cardiovascular: Normal rate, regular rhythm. Grossly normal heart sounds.  Good peripheral circulation. Respiratory: Normal respiratory effort.  No retractions. Lungs CTAB. Neurologic:  Normal speech and language. No gross focal neurologic deficits are appreciated. No gait instability. Skin:  Skin is warm, dry.  Hemostat Surgicel was applied on his previous visit to the ED.  This was removed and no active bleeding however patient continues to have some serosanguineous drainage.  Areas look clean and no evidence of infection was noted. Psychiatric: Mood and affect are normal. Speech and behavior are normal.  ____________________________________________   LABS (all labs ordered are listed, but only abnormal results are displayed)  Labs Reviewed - No data to display  PROCEDURES  Procedure(s) performed (including Critical Care):  Procedures  Restore dressing was applied by RN to the area. ____________________________________________   INITIAL IMPRESSION / ASSESSMENT AND PLAN / ED COURSE  As part of my medical decision making, I reviewed the following data within the electronic MEDICAL RECORD NUMBER Notes from prior ED visits and  Controlled Substance Database  86 year old male presents to the ED for dressing  change and wound recheck.  Patient had an injury to his arm which left multiple skin tears to his right upper extremity.  Surgicel hemostatic dressing was removed and there was no evidence of infection.  There appeared to be some serous sanguinous drainage but no active bleeding.  Restorer dressing was applied to this area.  Patient was made aware that this could stay on for as long as 7 days.  Arrangements are being made to see if home health can help them with dressings at least 3 times a week/or every 7 days with the restore as patient is unable to manage this.  He is also anxious about this area being infected.  He was reassured that there is no signs of infection at this time.  He also is aware that the restore can stay on for 7 days and with the winter weather coming he does not need to be out traveling.  ____________________________________________   FINAL CLINICAL IMPRESSION(S) / ED DIAGNOSES  Final diagnoses:  Encounter for wound re-check     ED Discharge Orders    None       Note:  This document was prepared using Dragon voice recognition software and may include unintentional dictation errors.    Tommi Rumps, PA-C 07/23/19 1341    Minna Antis, MD 07/23/19 1515

## 2019-07-24 ENCOUNTER — Telehealth: Payer: Self-pay | Admitting: Emergency Medicine

## 2019-07-24 NOTE — Telephone Encounter (Signed)
Notified by Social Work that patient needs home health order placed for wound care. Reviewed records from recent visit. See separate encounter notes.

## 2019-08-05 ENCOUNTER — Other Ambulatory Visit: Payer: Self-pay

## 2019-08-05 ENCOUNTER — Ambulatory Visit: Payer: Medicare Other | Admitting: Physician Assistant

## 2019-08-05 ENCOUNTER — Encounter: Payer: Self-pay | Admitting: Physician Assistant

## 2019-08-05 ENCOUNTER — Ambulatory Visit (INDEPENDENT_AMBULATORY_CARE_PROVIDER_SITE_OTHER): Payer: Medicare Other | Admitting: Physician Assistant

## 2019-08-05 VITALS — BP 148/78 | HR 78 | Ht 71.0 in | Wt 150.0 lb

## 2019-08-05 DIAGNOSIS — R339 Retention of urine, unspecified: Secondary | ICD-10-CM

## 2019-08-05 DIAGNOSIS — R3915 Urgency of urination: Secondary | ICD-10-CM | POA: Diagnosis not present

## 2019-08-05 LAB — URINALYSIS, COMPLETE
Bilirubin, UA: NEGATIVE
Glucose, UA: NEGATIVE
Ketones, UA: NEGATIVE
Nitrite, UA: POSITIVE — AB
Specific Gravity, UA: 1.025 (ref 1.005–1.030)
Urobilinogen, Ur: 0.2 mg/dL (ref 0.2–1.0)
pH, UA: 6.5 (ref 5.0–7.5)

## 2019-08-05 LAB — MICROSCOPIC EXAMINATION
Epithelial Cells (non renal): NONE SEEN /hpf (ref 0–10)
WBC, UA: >30 /hpf — AB (ref 0–5)

## 2019-08-05 MED ORDER — SULFAMETHOXAZOLE-TRIMETHOPRIM 800-160 MG PO TABS: 1.0000 | ORAL_TABLET | Freq: Two times a day (BID) | ORAL | 0 refills | Status: AC

## 2019-08-05 NOTE — Progress Notes (Signed)
08/05/2019 11:22 AM   Matthew Greer 1925-10-13 [id number]  CC: Follow-up incomplete bladder emptying  HPI: Matthew Greer is a 86 y.o. male who presents today for follow-up of incomplete bladder emptying.  He was seen most recently in clinic by me on 05/06/2019 for repeat PVR on Flomax.  PVR was elevated at 358 mL at that time; he was instructed on CIC at that visit and counseled to cath himself once daily to drain his bladder.  In the interim, he reports he has continued with once daily CIC.  He has not had any problems with this.  He does note that he has stopped being able to urinate on his own and now relies exclusively on self-catheterization to empty his bladder.  He estimates draining approximately 800 mL of urine from his bladder every night before bed.  Additionally, he reports an approximate 4-day history of urinary urgency and is concerned that he might have a urinary infection today.  He denies frequency and gross hematuria.  In-office catheterized UA today positive for trace-intact blood, 1+ protein, nitrites, and 3+ leukocyte esterase; urine microscopy with >30 WBCs/HPF.  PMH: Past Medical History:  Diagnosis Date  . Hypertension   . Stroke [facility]) 2013   "minor" - no deficits    Surgical History: Past Surgical History:  Procedure Laterality Date  . ESOPHAGEAL DILATION  02/24/2016   Procedure: ESOPHAGEAL DILATION;  Surgeon: Lucilla Lame, MD;  Location: [place];  Service: Endoscopy;;  . ESOPHAGOGASTRODUODENOSCOPY (EGD) WITH PROPOFOL N/A 02/24/2016   Procedure: ESOPHAGOGASTRODUODENOSCOPY (EGD) WITH PROPOFOL;  Surgeon: Lucilla Lame, MD;  Location: [place];  Service: Endoscopy;  Laterality: N/A;  . ESOPHAGOGASTRODUODENOSCOPY ENDOSCOPY  03/2013   Dr. Allen Norris  . THORACOTOMY  1953    Home Medications:  Allergies as of 08/05/2019      Reactions   Aspirin Other (See Comments)   On plavix.  Has h.o GI bleed.   Finasteride Nausea Only, Other (See Comments)    Reaction:  Confusion    Levofloxacin Other (See Comments)   Lisinopril Other (See Comments)   Reaction:  Confusion      Medication List       Accurate as of August 05, 2019 11:22 AM. If you have any questions, ask your nurse or doctor.        acetaminophen 325 MG tablet Commonly known as: TYLENOL Take 325-650 mg by mouth every 6 (six) hours as needed for mild pain, moderate pain, fever or headache.   amLODipine 5 MG tablet Commonly known as: NORVASC Take 1 tablet (5 mg total) by mouth daily.   aspirin 81 MG EC tablet Take 1 tablet (81 mg total) by mouth daily.   atorvastatin 10 MG tablet Commonly known as: LIPITOR Take 10 mg by mouth every evening.   clopidogrel 75 MG tablet Commonly known as: PLAVIX Take 75 mg by mouth daily.   metoprolol tartrate 50 MG tablet Commonly known as: LOPRESSOR Take 1 tablet (50 mg total) by mouth 2 (two) times daily.   multivitamin with minerals Tabs tablet Take 1 tablet by mouth daily.   tamsulosin 0.4 MG Caps capsule Commonly known as: FLOMAX Take 1 capsule (0.4 mg total) by mouth daily.   triamcinolone cream 0.1 % Commonly known as: KENALOG Apply 1 application topically 2 (two) times daily.   vitamin B-12 1000 MCG tablet Commonly known as: CYANOCOBALAMIN Take 1,000 mcg by mouth daily.       Allergies:  Allergies  Allergen Reactions  .  Aspirin Other (See Comments)    On plavix.  Has h.o GI bleed.  . Finasteride Nausea Only and Other (See Comments)    Reaction:  Confusion   . Levofloxacin Other (See Comments)  . Lisinopril Other (See Comments)    Reaction:  Confusion    Family History: No family history on file.  Social History:   reports that he has quit smoking. He has never used smokeless tobacco. He reports that he does not drink alcohol. No history on file for drug.  Physical Exam: BP (!) 148/78   Pulse 78   Ht 5\' 11"  (1.803 m)   Wt 150 lb (68 kg)   BMI 20.92 kg/m   Constitutional:  Alert and oriented,  no acute distress, nontoxic appearing HEENT: [place], AT Cardiovascular: No clubbing, cyanosis, or edema Respiratory: Normal respiratory effort, no increased work of breathing Skin: No rashes, bruises or suspicious lesions Neurologic: Right hemiplegia Psychiatric: Normal mood and affect  Laboratory Data: Results for orders placed or performed in visit on 08/05/19  Microscopic Examination   URINE  Result Value Ref Range   WBC, UA >30 (A) 0 - 5 /hpf   RBC 0-2 0 - 2 /hpf   Epithelial Cells (non renal) None seen 0 - 10 /hpf   Casts Present (A) None seen /lpf   Cast Type Hyaline casts N/A   Crystals Present (A) N/A   Crystal Type Amorphous Sediment N/A   Bacteria, UA Few None seen/Few  Urinalysis, Complete  Result Value Ref Range   Specific Gravity, UA 1.025 1.005 - 1.030   pH, UA 6.5 5.0 - 7.5   Color, UA Yellow Yellow   Appearance Ur Cloudy (A) Clear   Leukocytes,UA 3+ (A) Negative   Protein,UA 1+ (A) Negative/Trace   Glucose, UA Negative Negative   Ketones, UA Negative Negative   RBC, UA Trace (A) Negative   Bilirubin, UA Negative Negative   Urobilinogen, Ur 0.2 0.2 - 1.0 mg/dL   Nitrite, UA Positive (A) Negative   Microscopic Examination See below:    Assessment & Plan:   1. Urinary urgency 86 year old male with a history of incomplete bladder emptying on CIC reports an approximate 4-day history of urinary urgency.  UA grossly infected today.  Will start him on empiric Bactrim and send for culture.  I counseled the patient that I would contact him if his urine culture indicated a necessary change in therapy.  He expressed understanding. - Urinalysis, Complete - CULTURE, URINE COMPREHENSIVE - sulfamethoxazole-trimethoprim (BACTRIM DS) 800-160 MG tablet; Take 1 tablet by mouth 2 (two) times daily for 7 days.  Dispense: 14 tablet; Refill: 0  2. Urinary retention Given reports of the inability to urinate, I counseled the patient today to increase his frequency of  self-catheterization from once daily to twice daily.  He expressed understanding.  Return in about 1 year (around 08/04/2020) for Retention follow-up.  10/04/2020, PA-C  [facility] [address] [place], [place] [phone]

## 2019-08-05 NOTE — Patient Instructions (Signed)
Increase self cath to twice daily     Step 1 Get all of your supplies ready and place near you. Step 2 Wash your hands, or put on gloves. Step 3 Wash around the tip of your penis with warm antibacterial soapy water. Step 4 Take catheter out of package and drain the lubricant over toilet. Step 5 While holding the penis at a 45 degree angle from the stomach in one hand and the catheter in the other hand  Step 6 Insert the catheter slowly into your urethra. If there is resistance when the catheter reaches the sphincter muscle,              take a deep breath and gently apply steady pressure.              DO NOT FORCE THE CATHETER Step 7 When the urine begins to flow insert another inch and lower penis. Allow the urine to flow into the toilet. Step 8 When the flow of urine stops, slowly remove the catheter.

## 2019-08-06 ENCOUNTER — Telehealth: Payer: Self-pay

## 2019-08-06 NOTE — Telephone Encounter (Signed)
Patient called wanting to know if he was to be taking Flomax? It is on his medication list but unclear if he is to continue

## 2019-08-07 ENCOUNTER — Ambulatory Visit: Payer: Medicare Other | Admitting: Physician Assistant

## 2019-08-07 ENCOUNTER — Telehealth: Payer: Self-pay

## 2019-08-07 NOTE — Telephone Encounter (Signed)
Left patient a VM to return call.

## 2019-08-07 NOTE — Telephone Encounter (Signed)
Incoming call from pt's caregiver, she wants to know if pt should resume taking Flomax (rx was sent in back in November 30 day supply with no refills) if so they will need a new RX sent to [facility] in [place]. Please advise. Thanks

## 2019-08-07 NOTE — Telephone Encounter (Signed)
Please contact the patient and inform him that he does not need to continue Flomax unless he thinks it was helping with ease of self-catheterization. If so, I am happy to refill it for them.

## 2019-08-09 LAB — CULTURE, URINE COMPREHENSIVE

## 2019-08-18 ENCOUNTER — Telehealth: Payer: Self-pay

## 2019-08-18 NOTE — Telephone Encounter (Signed)
No, no more antibiotics needed.  I am glad he is feeling better.

## 2019-08-18 NOTE — Telephone Encounter (Signed)
Left pt mess to call 

## 2019-08-18 NOTE — Telephone Encounter (Signed)
Patient's wife called stating he has completed the Bactrim and is feeling better.

## 2019-08-18 NOTE — Telephone Encounter (Signed)
Pt informed no additional antibiotics needed.

## 2019-09-30 ENCOUNTER — Emergency Department: Payer: Medicare Other

## 2019-09-30 ENCOUNTER — Observation Stay
Admission: EM | Admit: 2019-09-30 | Discharge: 2019-10-02 | Disposition: A | Discharge status: Home / Self Care | Payer: Medicare Other | Attending: Internal Medicine | Admitting: Internal Medicine

## 2019-09-30 ENCOUNTER — Other Ambulatory Visit: Payer: Self-pay

## 2019-09-30 ENCOUNTER — Encounter: Payer: Self-pay | Admitting: *Deleted

## 2019-09-30 DIAGNOSIS — N179 Acute kidney failure, unspecified: Secondary | ICD-10-CM | POA: Diagnosis present

## 2019-09-30 DIAGNOSIS — Z7982 Long term (current) use of aspirin: Secondary | ICD-10-CM | POA: Diagnosis not present

## 2019-09-30 DIAGNOSIS — N139 Obstructive and reflux uropathy, unspecified: Secondary | ICD-10-CM | POA: Diagnosis present

## 2019-09-30 DIAGNOSIS — Z7902 Long term (current) use of antithrombotics/antiplatelets: Secondary | ICD-10-CM | POA: Diagnosis not present

## 2019-09-30 DIAGNOSIS — N1831 Chronic kidney disease, stage 3a: Secondary | ICD-10-CM | POA: Insufficient documentation

## 2019-09-30 DIAGNOSIS — H9190 Unspecified hearing loss, unspecified ear: Secondary | ICD-10-CM | POA: Insufficient documentation

## 2019-09-30 DIAGNOSIS — E785 Hyperlipidemia, unspecified: Secondary | ICD-10-CM | POA: Insufficient documentation

## 2019-09-30 DIAGNOSIS — I10 Essential (primary) hypertension: Secondary | ICD-10-CM | POA: Diagnosis present

## 2019-09-30 DIAGNOSIS — R531 Weakness: Secondary | ICD-10-CM | POA: Diagnosis not present

## 2019-09-30 DIAGNOSIS — R42 Dizziness and giddiness: Secondary | ICD-10-CM | POA: Diagnosis not present

## 2019-09-30 DIAGNOSIS — Z87891 Personal history of nicotine dependence: Secondary | ICD-10-CM | POA: Insufficient documentation

## 2019-09-30 DIAGNOSIS — Z87440 Personal history of urinary (tract) infections: Secondary | ICD-10-CM | POA: Diagnosis not present

## 2019-09-30 DIAGNOSIS — E860 Dehydration: Secondary | ICD-10-CM | POA: Insufficient documentation

## 2019-09-30 DIAGNOSIS — N3000 Acute cystitis without hematuria: Secondary | ICD-10-CM | POA: Diagnosis not present

## 2019-09-30 DIAGNOSIS — R55 Syncope and collapse: Secondary | ICD-10-CM | POA: Insufficient documentation

## 2019-09-30 DIAGNOSIS — Z79899 Other long term (current) drug therapy: Secondary | ICD-10-CM | POA: Diagnosis not present

## 2019-09-30 DIAGNOSIS — Z20822 Contact with and (suspected) exposure to covid-19: Secondary | ICD-10-CM | POA: Insufficient documentation

## 2019-09-30 DIAGNOSIS — N401 Enlarged prostate with lower urinary tract symptoms: Secondary | ICD-10-CM | POA: Insufficient documentation

## 2019-09-30 DIAGNOSIS — Z8673 Personal history of transient ischemic attack (TIA), and cerebral infarction without residual deficits: Secondary | ICD-10-CM | POA: Insufficient documentation

## 2019-09-30 DIAGNOSIS — N189 Chronic kidney disease, unspecified: Secondary | ICD-10-CM

## 2019-09-30 DIAGNOSIS — N390 Urinary tract infection, site not specified: Secondary | ICD-10-CM | POA: Diagnosis present

## 2019-09-30 DIAGNOSIS — I129 Hypertensive chronic kidney disease with stage 1 through stage 4 chronic kidney disease, or unspecified chronic kidney disease: Secondary | ICD-10-CM | POA: Diagnosis not present

## 2019-09-30 DIAGNOSIS — N138 Other obstructive and reflux uropathy: Secondary | ICD-10-CM | POA: Diagnosis not present

## 2019-09-30 DIAGNOSIS — Z66 Do not resuscitate: Secondary | ICD-10-CM | POA: Insufficient documentation

## 2019-09-30 LAB — RESPIRATORY PANEL BY RT PCR (FLU A&B, COVID)
Influenza A by PCR: NEGATIVE
Influenza B by PCR: NEGATIVE
SARS Coronavirus 2 by RT PCR: NEGATIVE

## 2019-09-30 LAB — BASIC METABOLIC PANEL
Anion gap: 9 (ref 5–15)
BUN: 30 mg/dL — ABNORMAL HIGH (ref 8–23)
CO2: 21 mmol/L — ABNORMAL LOW (ref 22–32)
Calcium: 8.9 mg/dL (ref 8.9–10.3)
Chloride: 106 mmol/L (ref 98–111)
Creatinine, Ser: 1.4 mg/dL — ABNORMAL HIGH (ref 0.61–1.24)
GFR calc Af Amer: 51 mL/min — ABNORMAL LOW (ref >60)
GFR calc non Af Amer: 44 mL/min — ABNORMAL LOW (ref >60)
Glucose, Bld: 116 mg/dL — ABNORMAL HIGH (ref 70–99)
Potassium: 3.9 mmol/L (ref 3.5–5.1)
Sodium: 136 mmol/L (ref 135–145)

## 2019-09-30 LAB — URINALYSIS, COMPLETE (UACMP) WITH MICROSCOPIC
Bilirubin Urine: NEGATIVE
Glucose, UA: NEGATIVE mg/dL
Ketones, ur: NEGATIVE mg/dL
Nitrite: POSITIVE — AB
Protein, ur: 100 mg/dL — AB
Specific Gravity, Urine: 1.013 (ref 1.005–1.030)
WBC, UA: >50 WBC/hpf — ABNORMAL HIGH (ref 0–5)
pH: 5 (ref 5.0–8.0)

## 2019-09-30 LAB — CBC
HCT: 36.3 % — ABNORMAL LOW (ref 39.0–52.0)
Hemoglobin: 11.8 g/dL — ABNORMAL LOW (ref 13.0–17.0)
MCH: 30 pg (ref 26.0–34.0)
MCHC: 32.5 g/dL (ref 30.0–36.0)
MCV: 92.4 fL (ref 80.0–100.0)
Platelets: 201 10*3/uL (ref 150–400)
RBC: 3.93 MIL/uL — ABNORMAL LOW (ref 4.22–5.81)
RDW: 13.6 % (ref 11.5–15.5)
WBC: 9.4 10*3/uL (ref 4.0–10.5)
nRBC: 0 % (ref 0.0–0.2)

## 2019-09-30 LAB — PROTIME-INR
INR: 1.2 (ref 0.8–1.2)
Prothrombin Time: 14.6 seconds (ref 11.4–15.2)

## 2019-09-30 LAB — TROPONIN I (HIGH SENSITIVITY)
Troponin I (High Sensitivity): 6 ng/L (ref <18)
Troponin I (High Sensitivity): 5 ng/L (ref <18)

## 2019-09-30 LAB — LACTIC ACID, PLASMA: Lactic Acid, Venous: 1.4 mmol/L (ref 0.5–1.9)

## 2019-09-30 MED ORDER — LOSARTAN POTASSIUM 50 MG PO TABS: 25.0000 mg | ORAL_TABLET | Freq: Every day | ORAL | Status: DC

## 2019-09-30 MED ORDER — ATORVASTATIN CALCIUM 10 MG PO TABS
10.0000 mg | ORAL_TABLET | Freq: Every day | ORAL | Status: DC
  Administered 2019-10-01: 17:00:00 10 mg via ORAL
  Filled 2019-09-30: qty 1

## 2019-09-30 MED ORDER — ONDANSETRON HCL 4 MG/2ML IJ SOLN: 4.0000 mg | Freq: Four times a day (QID) | INTRAMUSCULAR | Status: DC | PRN

## 2019-09-30 MED ORDER — SODIUM CHLORIDE 0.9 % IV SOLN
2.0000 g | Freq: Once | INTRAVENOUS | Status: AC
  Administered 2019-09-30: 21:00:00 2 g via INTRAVENOUS
  Filled 2019-09-30 (×2): qty 20

## 2019-09-30 MED ORDER — METOPROLOL TARTRATE 50 MG PO TABS
50.0000 mg | ORAL_TABLET | Freq: Two times a day (BID) | ORAL | Status: DC
  Administered 2019-09-30 – 2019-10-02 (×4): 50 mg via ORAL
  Filled 2019-09-30 (×4): qty 1

## 2019-09-30 MED ORDER — CLOPIDOGREL BISULFATE 75 MG PO TABS
75.0000 mg | ORAL_TABLET | Freq: Every day | ORAL | Status: DC
  Administered 2019-10-01 – 2019-10-02 (×2): 75 mg via ORAL
  Filled 2019-09-30 (×2): qty 1

## 2019-09-30 MED ORDER — SODIUM CHLORIDE 0.9 % IV SOLN: 500.0000 mg | Freq: Once | INTRAVENOUS | Status: DC

## 2019-09-30 MED ORDER — LABETALOL HCL 5 MG/ML IV SOLN: 20.0000 mg | INTRAVENOUS | Status: DC | PRN

## 2019-09-30 MED ORDER — ASPIRIN EC 81 MG PO TBEC
81.0000 mg | DELAYED_RELEASE_TABLET | Freq: Every day | ORAL | Status: DC
  Administered 2019-09-30 – 2019-10-02 (×3): 81 mg via ORAL
  Filled 2019-09-30 (×3): qty 1

## 2019-09-30 MED ORDER — ONDANSETRON HCL 4 MG PO TABS: 4.0000 mg | ORAL_TABLET | Freq: Four times a day (QID) | ORAL | Status: DC | PRN

## 2019-09-30 MED ORDER — SODIUM CHLORIDE 0.9 % IV SOLN: INTRAVENOUS | Status: DC

## 2019-09-30 MED ORDER — MAGNESIUM HYDROXIDE 400 MG/5ML PO SUSP: 30.0000 mL | Freq: Every day | ORAL | Status: DC | PRN

## 2019-09-30 MED ORDER — ADULT MULTIVITAMIN W/MINERALS CH
1.0000 | ORAL_TABLET | Freq: Every day | ORAL | Status: DC
  Administered 2019-09-30 – 2019-10-02 (×3): 1 via ORAL
  Filled 2019-09-30 (×3): qty 1

## 2019-09-30 MED ORDER — ENOXAPARIN SODIUM 40 MG/0.4ML ~~LOC~~ SOLN
40.0000 mg | SUBCUTANEOUS | Status: DC
  Administered 2019-10-01 – 2019-10-02 (×2): 40 mg via SUBCUTANEOUS
  Filled 2019-09-30 (×2): qty 0.4

## 2019-09-30 MED ORDER — SODIUM CHLORIDE 0.9% FLUSH
3.0000 mL | Freq: Once | INTRAVENOUS | Status: AC
  Administered 2019-09-30: 22:00:00 3 mL via INTRAVENOUS

## 2019-09-30 MED ORDER — FUROSEMIDE 10 MG/ML IJ SOLN: 40.0000 mg | Freq: Two times a day (BID) | INTRAMUSCULAR | Status: DC

## 2019-09-30 MED ORDER — AMLODIPINE BESYLATE 5 MG PO TABS
5.0000 mg | ORAL_TABLET | Freq: Every day | ORAL | Status: DC
  Administered 2019-10-01 – 2019-10-02 (×2): 5 mg via ORAL
  Filled 2019-09-30 (×2): qty 1

## 2019-09-30 MED ORDER — TAMSULOSIN HCL 0.4 MG PO CAPS
0.4000 mg | ORAL_CAPSULE | Freq: Every day | ORAL | Status: DC
  Administered 2019-09-30 – 2019-10-02 (×3): 0.4 mg via ORAL
  Filled 2019-09-30 (×3): qty 1

## 2019-09-30 MED ORDER — VITAMIN B-12 1000 MCG PO TABS
1000.0000 ug | ORAL_TABLET | Freq: Every day | ORAL | Status: DC
  Administered 2019-10-01 – 2019-10-02 (×2): 1000 ug via ORAL
  Filled 2019-09-30 (×2): qty 1

## 2019-09-30 MED ORDER — TRAZODONE HCL 50 MG PO TABS: 25.0000 mg | ORAL_TABLET | Freq: Every evening | ORAL | Status: DC | PRN

## 2019-09-30 MED ORDER — ACETAMINOPHEN 325 MG PO TABS: 650.0000 mg | ORAL_TABLET | Freq: Four times a day (QID) | ORAL | Status: DC | PRN

## 2019-09-30 MED ORDER — SODIUM CHLORIDE 0.9 % IV SOLN
1.0000 g | INTRAVENOUS | Status: DC
  Administered 2019-10-01: 17:00:00 1 g via INTRAVENOUS
  Filled 2019-09-30 (×2): qty 10
  Filled 2019-09-30: qty 1

## 2019-09-30 MED ORDER — ACETAMINOPHEN 650 MG RE SUPP: 650.0000 mg | Freq: Four times a day (QID) | RECTAL | Status: DC | PRN

## 2019-09-30 MED ORDER — SODIUM CHLORIDE 0.9 % IV BOLUS
1000.0000 mL | Freq: Once | INTRAVENOUS | Status: AC
  Administered 2019-09-30: 1000 mL via INTRAVENOUS

## 2019-09-30 NOTE — H&P (Signed)
[place] at [facility]   PATIENT NAME: Matthew Greer    MR#:  [id number]  DATE OF BIRTH:  1925-07-24  DATE OF ADMISSION:  09/30/2019  PRIMARY CARE PHYSICIAN: Rolm Gala, MD   REQUESTING/REFERRING PHYSICIAN: Shaune Pollack, MD CHIEF COMPLAINT:   Chief Complaint  Patient presents with  . Weakness    HISTORY OF PRESENT ILLNESS:  Matthew Greer  is a 86 y.o. pleasant Caucasian male with a known history of hearing loss, CVA and hypertension, presented to the emergency room with the onset of generalized weakness with low-grade fever of 100.2 and possibly presyncope while he was seen at his primary care physician's office today.  He had his second dose of COVID-19 vaccine more than 2 weeks ago.  He denied any cough or wheezing or dyspnea.  No nausea or vomiting or abdominal pain.  He has been having mild urgency without other urinary symptoms including dysuria or hematuria or oliguria or flank pain.  No chest pain or palpitations.  Upon presentation to the emergency room, temperature 100.2 with otherwise normal vital signs and her blood pressure was 151/96.  COVID-19 PCR and influenza antigens came back negative.  Lactic acid was 1.4.  Blood cultures were drawn.  Two-view chest x-ray showed mild left basal atelectasis.  UA was strongly positive for UTI.  BMP was remarkable for a BUN of 30 and creatinine 1.4 compared to 20 and 1.19 on 04/11/2019 and CBC showed mild anemia close to baseline.  The patient was given IV Rocephin and 1 L bolus of IV normal saline.  He will be admitted to a medically monitored bed for further evaluation and management.   PAST MEDICAL HISTORY:   Past Medical History:  Diagnosis Date  . Hypertension   . Stroke [facility]) 2013   "minor" - no deficits    PAST SURGICAL HISTORY:   Past Surgical History:  Procedure Laterality Date  . ESOPHAGEAL DILATION  02/24/2016   Procedure: ESOPHAGEAL DILATION;  Surgeon: Midge Minium, MD;  Location: [facility];   Service: Endoscopy;;  . ESOPHAGOGASTRODUODENOSCOPY (EGD) WITH PROPOFOL N/A 02/24/2016   Procedure: ESOPHAGOGASTRODUODENOSCOPY (EGD) WITH PROPOFOL;  Surgeon: Midge Minium, MD;  Location: [facility];  Service: Endoscopy;  Laterality: N/A;  . ESOPHAGOGASTRODUODENOSCOPY ENDOSCOPY  03/2013   Dr. Servando Snare  . THORACOTOMY  1953    SOCIAL HISTORY:   Social History   Tobacco Use  . Smoking status: Former Games developer  . Smokeless tobacco: Never Used  Substance Use Topics  . Alcohol use: No    FAMILY HISTORY:  No family history on file.  DRUG ALLERGIES:   Allergies  Allergen Reactions  . Aspirin Other (See Comments)    On plavix.  Has h.o GI bleed.  . Finasteride Nausea Only and Other (See Comments)    Reaction:  Confusion   . Levofloxacin Other (See Comments)  . Lisinopril Other (See Comments)    Reaction:  Confusion    REVIEW OF SYSTEMS:   ROS As per history of present illness. All pertinent systems were reviewed above. Constitutional,  HEENT, cardiovascular, respiratory, GI, GU, musculoskeletal, neuro, psychiatric, endocrine,  integumentary and hematologic systems were reviewed and are otherwise  negative/unremarkable except for positive findings mentioned above in the HPI.   MEDICATIONS AT HOME:   Prior to Admission medications   Medication Sig Start Date End Date Taking? Authorizing Provider  acetaminophen (TYLENOL) 325 MG tablet Take 325-650 mg by mouth every 6 (six) hours as needed for mild pain, moderate pain,  fever or headache.    Yes [provider]  amLODipine (NORVASC) 5 MG tablet Take 1 tablet (5 mg total) by mouth daily. 04/13/19  Yes Fritzi Mandes, MD  aspirin EC 81 MG EC tablet Take 1 tablet (81 mg total) by mouth daily. 04/13/19  Yes Fritzi Mandes, MD  atorvastatin (LIPITOR) 10 MG tablet Take 10 mg by mouth every evening.    Yes [provider]  clobetasol cream (TEMOVATE) 3.41 % Apply 1 application topically daily. (for up to 2 weeks) 09/15/19  Yes  [provider]  clopidogrel (PLAVIX) 75 MG tablet Take 75 mg by mouth daily.   Yes [provider]  losartan (COZAAR) 25 MG tablet Take 25 mg by mouth daily. 09/15/19  Yes [provider]  metoprolol (LOPRESSOR) 50 MG tablet Take 1 tablet (50 mg total) by mouth 2 (two) times daily. 05/28/16  Yes Bettey Costa, MD  Multiple Vitamin (MULTIVITAMIN WITH MINERALS) TABS tablet Take 1 tablet by mouth daily.   Yes [provider]  tamsulosin (FLOMAX) 0.4 MG CAPS capsule Take 1 capsule (0.4 mg total) by mouth daily. 04/25/19  Yes Vaillancourt, Samantha, PA-C  vitamin B-12 (CYANOCOBALAMIN) 1000 MCG tablet Take 1,000 mcg by mouth daily.   Yes [provider]      VITAL SIGNS:  Blood pressure (!) 164/75, pulse 85, temperature 100.2 F (37.9 C), temperature source Oral, resp. rate 16, height 5\' 11"  (1.803 m), weight 68 kg, SpO2 100 %.  PHYSICAL EXAMINATION:  Physical Exam  GENERAL:  86 y.o.-year-old Caucasian male patient lying in the bed with no acute distress.  EYES: Pupils equal, round, reactive to light and accommodation. No scleral icterus. Extraocular muscles intact.  HEENT: Head atraumatic, normocephalic. Oropharynx and nasopharynx clear.  NECK:  Supple, no jugular venous distention. No thyroid enlargement, no tenderness.  LUNGS: Normal breath sounds bilaterally, no wheezing, rales,rhonchi or crepitation. No use of accessory muscles of respiration.  CARDIOVASCULAR: Regular rate and rhythm, S1, S2 normal. No murmurs, rubs, or gallops.  ABDOMEN: Soft, nondistended, nontender. Bowel sounds present. No organomegaly or mass.  EXTREMITIES: No pedal edema, cyanosis, or clubbing.  NEUROLOGIC: Cranial nerves II through XII are intact. Muscle strength 5/5 in all extremities. Sensation intact. Gait not checked.  PSYCHIATRIC: The patient is alert and oriented x 3.  Normal affect and good eye contact. SKIN: No obvious rash, lesion, or ulcer.   LABORATORY PANEL:    CBC Recent Labs  Lab 09/30/19 1636  WBC 9.4  HGB 11.8*  HCT 36.3*  PLT 201   ------------------------------------------------------------------------------------------------------------------  Chemistries  Recent Labs  Lab 09/30/19 1636  NA 136  K 3.9  CL 106  CO2 21*  GLUCOSE 116*  BUN 30*  CREATININE 1.40*  CALCIUM 8.9   ------------------------------------------------------------------------------------------------------------------  Cardiac Enzymes No results for input(s): TROPONINI in the last 168 hours. ------------------------------------------------------------------------------------------------------------------  RADIOLOGY:  DG Chest 2 View  Result Date: 09/30/2019 CLINICAL DATA:  Weakness EXAM: CHEST - 2 VIEW COMPARISON:  04/10/2019 FINDINGS: Cardiac shadow is stable. Aortic calcifications are noted. The lungs are well aerated bilaterally. Minimal left basilar atelectasis is noted. No sizable effusion is seen. No focal infiltrate is noted. No acute bony abnormality is seen. IMPRESSION: Mild left basilar atelectasis. Electronically Signed   By: Inez Catalina M.D.   On: 09/30/2019 17:23      IMPRESSION AND PLAN:  1.  UTI with associated fever without other clear signs of sepsis.. -The patient will be admitted to medical monitored bed. -We will continue  IV Rocephin. -We will follow urine and blood cultures. -The patient will be hydrated with IV normal saline.  2.  Generalized weakness and presyncope. -This likely secondary to volume depletion and dehydration as well as UTI. -Management as above. -We will check his orthostatics. -He will be monitored for arrhythmias.  3.  Acute kidney injury superimposed on stage IIIa chronic kidney disease. -The patient will be hydrated with IV normal saline and will follow his BMP. -This is likely prerenal due to volume depletion and dehydration and is likely contributing to #2. -Nephrotoxins will be avoided.  4.   Hypertension. -We will continue Lopressor and Norvasc and hold off Cozaar given acute kidney injury.  5.  History of CVA. -We will continue the patient on aspirin and Plavix.  6. BPH. -We will continue Flomax.  7.  DVT prophylaxis. -Subtenons Lovenox    All the records are reviewed and case discussed with ED provider. The plan of care was discussed in details with the patient (and family). I answered all questions. The patient agreed to proceed with the above mentioned plan. Further management will depend upon hospital course.   CODE STATUS: The patient wants to be DNR/DNI per his power of attorney who was with him in the room.  Status is: Inpatient  Remains inpatient appropriate because:Unsafe d/c plan, IV treatments appropriate due to intensity of illness or inability to take PO and Inpatient level of care appropriate due to severity of illness   Dispo: The patient is from: Home              Anticipated d/c is to: Home              Anticipated d/c date is: 2 days              Patient currently is not medically stable to d/c.   TOTAL TIME TAKING CARE OF THIS PATIENT: 55 minutes.    Hannah Beat M.D on 09/30/2019 at 10:50 PM  [facility]   From 7 PM-7 AM, contact night-coverage [email or web address]  CC: Primary care physician; Rolm Gala, MD   Note: This dictation was prepared with Dragon dictation along with smaller phrase technology. Any transcriptional errors that result from this process are unintentional.

## 2019-09-30 NOTE — ED Notes (Signed)
Pt spouse went home for night, states that he will visit tomorrow during visiting hours. Pt denies any further needs at this time.

## 2019-09-30 NOTE — ED Notes (Signed)
Pt given ice water.

## 2019-09-30 NOTE — ED Triage Notes (Signed)
Pt to triage via wheelchair.  Pt sent from [facility] primary care for eval of weakness and uti sx.  Pt alert  Speech clear

## 2019-09-30 NOTE — ED Provider Notes (Signed)
[place]  [facility] Emergency Department Provider Note  ____________________________________________   First MD Initiated Contact with Patient 09/30/19 1910     (approximate)  I have reviewed the triage vital signs and the nursing notes.   HISTORY  Chief Complaint Weakness    HPI Matthew Greer is a 86 y.o. male  With h/o HTN, HLD, chronic urinary retention with ISC, recurrent UTI, here with weakness. Pt reports weakness for the past 2 days, with little to no energy. He has been self cathing and does not recall change in urine. He did not want to even get out of bed or the chair today. He also spiked a fever. He called his PCP and was told to come here. He had a UTI last month that was treated, felt better. No n/v. No diarrhea though he did have one loose BM yesterday. No other complaints.        Past Medical History:  Diagnosis Date  . Hypertension   . Stroke [facility]) 2013   "minor" - no deficits    Patient Active Problem List   Diagnosis Date Noted  . HTN (hypertension), malignant   . Acute cystitis without hematuria   . AKI (acute kidney injury) ([place])   . Hearing impaired person, bilateral   . Obstructive uropathy 04/10/2019  . Acute CVA (cerebrovascular accident) ([place]) 04/10/2019  . Acute blood loss anemia 08/15/2016  . Hip hematoma, right, initial encounter 08/15/2016  . CVA (cerebral vascular accident) ([place]) 05/25/2016  . Problems with swallowing and mastication   . Stricture and stenosis of esophagus   . Benign essential hypertension 01/27/2016  . Kidney stones 01/27/2016  . Other nonspecific abnormal finding of lung field 01/27/2016  . Pure hypercholesterolemia 01/27/2016  . Stroke ([place]) 01/27/2016  . 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 Rotator cuff tendonitis 11/10/2013  . Hearing loss 04/09/2013  . Eczema 07/26/2012  . Abdominal pain 12/27/2011    Past Surgical History:  Procedure Laterality Date  .  ESOPHAGEAL DILATION  02/24/2016   Procedure: ESOPHAGEAL DILATION;  Surgeon: Lucilla Lame, MD;  Location: [place];  Service: Endoscopy;;  . ESOPHAGOGASTRODUODENOSCOPY (EGD) WITH PROPOFOL N/A 02/24/2016   Procedure: ESOPHAGOGASTRODUODENOSCOPY (EGD) WITH PROPOFOL;  Surgeon: Lucilla Lame, MD;  Location: [place];  Service: Endoscopy;  Laterality: N/A;  . ESOPHAGOGASTRODUODENOSCOPY ENDOSCOPY  03/2013   Dr. Allen Norris  . THORACOTOMY  1953    Prior to Admission medications   Medication Sig Start Date End Date Taking? Authorizing Provider  acetaminophen (TYLENOL) 325 MG tablet Take 325-650 mg by mouth every 6 (six) hours as needed for mild pain, moderate pain, fever or headache.     [provider]  amLODipine (NORVASC) 5 MG tablet Take 1 tablet (5 mg total) by mouth daily. 04/13/19   Fritzi Mandes, MD  aspirin EC 81 MG EC tablet Take 1 tablet (81 mg total) by mouth daily. 04/13/19   Fritzi Mandes, MD  atorvastatin (LIPITOR) 10 MG tablet Take 10 mg by mouth every evening.     [provider]  clopidogrel (PLAVIX) 75 MG tablet Take 75 mg by mouth daily.    [provider]  metoprolol (LOPRESSOR) 50 MG tablet Take 1 tablet (50 mg total) by mouth 2 (two) times daily. 05/28/16   Bettey Costa, MD  Multiple Vitamin (MULTIVITAMIN WITH MINERALS) TABS tablet Take 1 tablet by mouth daily.    [provider]  tamsulosin (FLOMAX) 0.4 MG CAPS capsule Take 1 capsule (0.4  mg total) by mouth daily. 04/25/19   Vaillancourt, Lelon Mast, PA-C  triamcinolone cream (KENALOG) 0.1 % Apply 1 application topically 2 (two) times daily.    [provider]  vitamin B-12 (CYANOCOBALAMIN) 1000 MCG tablet Take 1,000 mcg by mouth daily.    [provider]    Allergies Aspirin, Finasteride, Levofloxacin, and Lisinopril  No family history on file.  Social History Social History   Tobacco Use  . Smoking status: Former Games developer  . Smokeless tobacco: Never Used  Substance  Use Topics  . Alcohol use: No  . Drug use: Not on file    Review of Systems  Review of Systems  Constitutional: Positive for fatigue. Negative for chills and fever.  HENT: Negative for sore throat.   Respiratory: Negative for shortness of breath.   Cardiovascular: Negative for chest pain.  Gastrointestinal: Negative for abdominal pain.  Genitourinary: Negative for flank pain.  Musculoskeletal: Negative for neck pain.  Skin: Negative for rash and wound.  Allergic/Immunologic: Negative for immunocompromised state.  Neurological: Positive for weakness. Negative for numbness.  Hematological: Does not bruise/bleed easily.  All other systems reviewed and are negative.    ____________________________________________  PHYSICAL EXAM:      VITAL SIGNS: ED Triage Vitals  Enc Vitals Group     BP 09/30/19 1630 133/78     Pulse Rate 09/30/19 1630 79     Resp 09/30/19 1630 20     Temp 09/30/19 1630 100.2 F (37.9 C)     Temp Source 09/30/19 1630 Oral     SpO2 09/30/19 1630 96 %     Weight 09/30/19 1633 150 lb (68 kg)     Height 09/30/19 1633 5\' 11"  (1.803 m)     Head Circumference --      Peak Flow --      Pain Score 09/30/19 1633 0     Pain Loc --      Pain Edu? --      Excl. in GC? --      Physical Exam Vitals and nursing note reviewed.  Constitutional:      General: He is not in acute distress.    Appearance: He is well-developed.  HENT:     Head: Normocephalic and atraumatic.     Mouth/Throat:     Mouth: Mucous membranes are dry.  Eyes:     Conjunctiva/sclera: Conjunctivae normal.  Cardiovascular:     Rate and Rhythm: Normal rate and regular rhythm.     Heart sounds: Normal heart sounds.  Pulmonary:     Effort: Pulmonary effort is normal. No respiratory distress.     Breath sounds: No wheezing.  Abdominal:     General: There is no distension.     Tenderness: There is abdominal tenderness in the suprapubic area.  Musculoskeletal:     Cervical back: Neck supple.    Skin:    General: Skin is warm.     Capillary Refill: Capillary refill takes less than 2 seconds.     Findings: No rash.  Neurological:     Mental Status: He is alert and oriented to person, place, and time.     Motor: No abnormal muscle tone.       ____________________________________________   LABS (all labs ordered are listed, but only abnormal results are displayed)  Labs Reviewed  BASIC METABOLIC PANEL - Abnormal; Notable for the following components:      Result Value   CO2 21 (*)    Glucose, Bld 116 (*)  BUN 30 (*)    Creatinine, Ser 1.40 (*)    GFR calc non Af Amer 44 (*)    GFR calc Af Amer 51 (*)    All other components within normal limits  CBC - Abnormal; Notable for the following components:   RBC 3.93 (*)    Hemoglobin 11.8 (*)    HCT 36.3 (*)    All other components within normal limits  URINALYSIS, COMPLETE (UACMP) WITH MICROSCOPIC - Abnormal; Notable for the following components:   Color, Urine YELLOW (*)    APPearance CLOUDY (*)    Hgb urine dipstick MODERATE (*)    Protein, ur 100 (*)    Nitrite POSITIVE (*)    Leukocytes,Ua LARGE (*)    WBC, UA >50 (*)    Bacteria, UA MANY (*)    All other components within normal limits  CULTURE, BLOOD (ROUTINE X 2)  CULTURE, BLOOD (ROUTINE X 2)  URINE CULTURE  PROTIME-INR  LACTIC ACID, PLASMA  LACTIC ACID, PLASMA  TROPONIN I (HIGH SENSITIVITY)  TROPONIN I (HIGH SENSITIVITY)    ____________________________________________  EKG: Normal sinus rhythm, ventricular rate 74.  PR 220, QRS 80, QTc 4 4.  No acute ST elevations or depressions. ________________________________________  RADIOLOGY All imaging, including plain films, CT scans, and ultrasounds, independently reviewed by me, and interpretations confirmed via formal radiology reads.  ED MD interpretation:   Chest x-ray: Mild left basilar atelectasis  Official radiology report(s): DG Chest 2 View  Result Date: 09/30/2019 CLINICAL DATA:  Weakness  EXAM: CHEST - 2 VIEW COMPARISON:  04/10/2019 FINDINGS: Cardiac shadow is stable. Aortic calcifications are noted. The lungs are well aerated bilaterally. Minimal left basilar atelectasis is noted. No sizable effusion is seen. No focal infiltrate is noted. No acute bony abnormality is seen. IMPRESSION: Mild left basilar atelectasis. Electronically Signed   By: Alcide Clever M.D.   On: 09/30/2019 17:23    ____________________________________________  PROCEDURES   Procedure(s) performed (including Critical Care):  Procedures  ____________________________________________  INITIAL IMPRESSION / MDM / ASSESSMENT AND PLAN / ED COURSE  As part of my medical decision making, I reviewed the following data within the electronic MEDICAL RECORD NUMBER Nursing notes reviewed and incorporated, Old chart reviewed, Notes from prior ED visits, and  Controlled Substance Database       *RAMONDO DIETZE was evaluated in Emergency Department on 09/30/2019 for the symptoms described in the history of present illness. He was evaluated in the context of the global COVID-19 pandemic, which necessitated consideration that the patient might be at risk for infection with the SARS-CoV-2 virus that causes COVID-19. Institutional protocols and algorithms that pertain to the evaluation of patients at risk for COVID-19 are in a state of rapid change based on information released by regulatory bodies including the CDC and federal and state organizations. These policies and algorithms were followed during the patient's care in the ED.  Some ED evaluations and interventions may be delayed as a result of limited staffing during the pandemic.*     Medical Decision Making: 86 year old male here with generalized weakness.  Lab work from triage and medical history reviewed per records.  He intermittently self caths and has a history of recent UTI treated with Bactrim.  Clinically, he does not appear toxic but lab work does show likely mild  AKI as well as positive UTI.  No history of multidrug-resistant organisms per my review of his previous cultures.  Given his age, low-grade fever, mild AKI, and weakness, will  admit for IV fluids and antibiotics.  ____________________________________________  FINAL CLINICAL IMPRESSION(S) / ED DIAGNOSES  Final diagnoses:  Lower urinary tract infectious disease  Weakness  Dehydration     MEDICATIONS GIVEN DURING THIS VISIT:  Medications  sodium chloride flush (NS) 0.9 % injection 3 mL (has no administration in time range)  cefTRIAXone (ROCEPHIN) 2 g in sodium chloride 0.9 % 100 mL IVPB (has no administration in time range)  sodium chloride 0.9 % bolus 1,000 mL (has no administration in time range)     ED Discharge Orders    None       Note:  This document was prepared using Dragon voice recognition software and may include unintentional dictation errors.   Shaune Pollack, MD 09/30/19 [phone]

## 2019-09-30 NOTE — ED Notes (Signed)
This RN is waiting for pharmacy to resend Rocephin due to the seal being defective on the first one sent up

## 2019-10-01 DIAGNOSIS — N179 Acute kidney failure, unspecified: Secondary | ICD-10-CM

## 2019-10-01 DIAGNOSIS — N390 Urinary tract infection, site not specified: Secondary | ICD-10-CM | POA: Diagnosis present

## 2019-10-01 DIAGNOSIS — R531 Weakness: Secondary | ICD-10-CM

## 2019-10-01 LAB — CBC
HCT: 35.1 % — ABNORMAL LOW (ref 39.0–52.0)
Hemoglobin: 11 g/dL — ABNORMAL LOW (ref 13.0–17.0)
MCH: 29.5 pg (ref 26.0–34.0)
MCHC: 31.3 g/dL (ref 30.0–36.0)
MCV: 94.1 fL (ref 80.0–100.0)
Platelets: 181 10*3/uL (ref 150–400)
RBC: 3.73 MIL/uL — ABNORMAL LOW (ref 4.22–5.81)
RDW: 13.6 % (ref 11.5–15.5)
WBC: 7.4 10*3/uL (ref 4.0–10.5)
nRBC: 0 % (ref 0.0–0.2)

## 2019-10-01 LAB — BASIC METABOLIC PANEL
Anion gap: 8 (ref 5–15)
BUN: 30 mg/dL — ABNORMAL HIGH (ref 8–23)
CO2: 23 mmol/L (ref 22–32)
Calcium: 8.8 mg/dL — ABNORMAL LOW (ref 8.9–10.3)
Chloride: 109 mmol/L (ref 98–111)
Creatinine, Ser: 1.34 mg/dL — ABNORMAL HIGH (ref 0.61–1.24)
GFR calc Af Amer: 53 mL/min — ABNORMAL LOW (ref >60)
GFR calc non Af Amer: 46 mL/min — ABNORMAL LOW (ref >60)
Glucose, Bld: 94 mg/dL (ref 70–99)
Potassium: 3.6 mmol/L (ref 3.5–5.1)
Sodium: 140 mmol/L (ref 135–145)

## 2019-10-01 LAB — URINE CULTURE: Special Requests: NORMAL

## 2019-10-01 NOTE — Care Management Obs Status (Signed)
MEDICARE OBSERVATION STATUS NOTIFICATION   Patient Details  Name: Matthew Greer MRN: [id number] Date of Birth: 17-Oct-1925   Medicare Observation Status Notification Given:  Yes    Trenton Founds, RN 10/01/2019, 1:41 PM

## 2019-10-01 NOTE — TOC Initial Note (Addendum)
Transition of Care [facility]) - Initial/Assessment Note    Patient Details  Name: Matthew Greer MRN: [id number] Date of Birth: 1925-06-07  Transition of Care [facility]) CM/SW Contact:    Su Hilt, RN Phone Number: 10/01/2019, 10:10 AM  Clinical Narrative:                 Me with the patient to discuss DC plan and needs, He lives at home with his companion.  He has a hospital bed a wheelchair and a rolling wlaker, he stated he does not see the need for a BSC. His companion provides transportation when needed He has done [facility] previously but cant recall the agency that was used. He is agreeable to [facility] but not sure about rehab, he said he wanted to work with PT here to see how he does and what the recommendation is, I asked the physician for a PT and OT eval and treat order, will continue to monitor for needs  Expected Discharge Plan: [place] Barriers to Discharge: Continued Medical Work up   Patient Goals and CMS Choice Patient states their goals for this hospitalization and ongoing recovery are:: get better      Expected Discharge Plan and Services Expected Discharge Plan: [place]   Discharge Planning Services: CM Consult   Living arrangements for the past 2 months: Single Family Home                 DME Arranged: N/A                    Prior Living Arrangements/Services Living arrangements for the past 2 months: Single Family Home Lives with:: Significant Other, Domestic Partner Patient language and need for interpreter reviewed:: Yes Do you feel safe going back to the place where you live?: Yes      Need for Family Participation in Patient Care: No (Comment) Care giver support system in place?: Yes (comment) Current home services: DME(hospital bed, Wheelchair and rolling walker) Criminal Activity/Legal Involvement Pertinent to Current Situation/Hospitalization: No - Comment as needed  Activities of Daily Living Home Assistive  Devices/Equipment: Wheelchair, Eyeglasses, Hearing aid ADL Screening (condition at time of admission) Patient's cognitive ability adequate to safely complete daily activities?: Yes Is the patient deaf or have difficulty hearing?: Yes Does the patient have difficulty seeing, even when wearing glasses/contacts?: No Does the patient have difficulty concentrating, remembering, or making decisions?: No Patient able to express need for assistance with ADLs?: Yes Does the patient have difficulty dressing or bathing?: Yes Independently performs ADLs?: No Does the patient have difficulty walking or climbing stairs?: Yes Weakness of Legs: Right Weakness of Arms/Hands: Right  Permission Sought/Granted   Permission granted to share information with : Yes, Verbal Permission Granted              Emotional Assessment Appearance:: Appears stated age Attitude/Demeanor/Rapport: Engaged Affect (typically observed): Appropriate Orientation: : Oriented to Self, Oriented to Place, Oriented to  Time, Oriented to Situation Alcohol / Substance Use: Not Applicable Psych Involvement: No (comment)  Admission diagnosis:  Dehydration [E86.0] Lower urinary tract infectious disease [N39.0] Weakness [R53.1] Patient Active Problem List   Diagnosis Date Noted  . Weakness 09/30/2019  . HTN (hypertension), malignant   . Acute cystitis without hematuria   . AKI (acute kidney injury) ([place])   . Hearing impaired person, bilateral   . Obstructive uropathy 04/10/2019  . Acute CVA (cerebrovascular accident) ([place]) 04/10/2019  .  Acute blood loss anemia 08/15/2016  . Hip hematoma, right, initial encounter 08/15/2016  . CVA (cerebral vascular accident) (HCC) 05/25/2016  . Problems with swallowing and mastication   . Stricture and stenosis of esophagus   . Benign essential hypertension 01/27/2016  . Kidney stones 01/27/2016  . Other nonspecific abnormal finding of lung field 01/27/2016  . Pure hypercholesterolemia  01/27/2016  . Stroke (HCC) 01/27/2016  . 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 Rotator cuff tendonitis 11/10/2013  . Hearing loss 04/09/2013  . Eczema 07/26/2012  . Abdominal pain 12/27/2011   PCP:  Rolm Gala, MD Pharmacy:   [facility] [phone] [facility] [place] - [address] AT [facility] OF [address] [facility] [place] [address] Phone: [phone] Fax: [phone]  [facility] #[id number] [place], [place] - [address] AT [facility] OF [address] [place] [address] Phone: [phone] Fax: [phone]     Social Determinants of Health (SDOH) Interventions    Readmission Risk Interventions No flowsheet data found.

## 2019-10-01 NOTE — TOC Progression Note (Signed)
Transition of Care [facility]) - Progression Note    Patient Details  Name: Matthew Greer MRN: [id number] Date of Birth: [birth year]  Transition [facility]) CM/SW Contact  Barrie Dunker, RN Phone Number: 10/01/2019, 2:37 PM  Clinical Narrative:    Patient is open with [facility] for nursing and will open with PT and OT once order received, I made physician aware   Expected Discharge Plan: Home w Home Health Services Barriers to Discharge: Continued Medical Work up  Expected Discharge Plan and Services Expected Discharge Plan: Home w Home Health Services   Discharge Planning Services: CM Consult   Living arrangements for the past 2 months: Single Family Home                 DME Arranged: N/A                     Social Determinants of Health (SDOH) Interventions    Readmission Risk Interventions No flowsheet data found.

## 2019-10-01 NOTE — Progress Notes (Signed)
PROGRESS NOTE    Matthew Greer [facility]  XKG:[id number] DOB: Apr 08, 1926 DOA: 09/30/2019 PCP: Rolm Gala, MD    Chief Complaint  Patient presents with  . Weakness    Brief Narrative:  86 year old male with a history of CVA, hypertension and severely impaired hearing presented with generalized weakness, low-grade fever 100.2 F and a possible presyncopal episode while he was at PCP office.  Patient reported having urinary urgency for past few days.  In the ED patient tested negative for COVID-19 PCR and influenza antigen.  Initial blood work was unremarkable except for mild AKI with creatinine of 1.4. UA suggestive of UTI.  Patient placed on observation .  Assessment & Plan:   Principal problem Urinary tract infection (HCC) Continue empiric IV Rocephin.  Follow urine cultures.  Patient reported to the nurse that he does straight cath at home.  Will confirm.   Acute kidney injury superimposed on stage III chronic kidney disease Likely prerenal with dehydration.  Continue gentle IV hydration for now.  Avoid nephrotoxins.  History of CVA Continue aspirin, Plavix and statin  Essential hypertension Continue amlodipine and metoprolol. BPH Continue Flomax.  Generalized weakness with presyncopal symptoms. Likely dehydration with UTI and AKI.  DVT prophylaxis: Subcu Lovenox Code Status: DNR Family Communication: None Disposition: Home possibly tomorrow if weakness and renal function improves.  Pending final urine culture results.  Status is: Observation  The patient remains OBS appropriate and will d/c before 2 midnights.  Dispo: The patient is from: Home              Anticipated d/c is to: Home              Anticipated d/c date is: 1 day              Patient currently is not medically stable to d/c.       Consultants:   None   Procedures: None   Antimicrobials: Rocephin   Subjective: Seen and examined.  Very hard of hearing.  Denies any dysuria.  Improved urinary  frequency.  Objective: Vitals:   09/30/19 2200 09/30/19 2332 10/01/19 0331 10/01/19 0818  BP: (!) 164/75 (!) 170/83 140/68 135/62  Pulse: 85 86 79 77  Resp: 16 18 18 18   Temp:  99.5 F (37.5 C) 98.3 F (36.8 C) 99 F (37.2 C)  TempSrc:  Oral Oral Oral  SpO2: 100% 97% 99% 96%  Weight:      Height:        Intake/Output Summary (Last 24 hours) at 10/01/2019 1422 Last data filed at 10/01/2019 10/03/2019 Gross per 24 hour  Intake --  Output 200 ml  Net -200 ml   Filed Weights   09/30/19 1633  Weight: 68 kg    Examination:  General: Elderly male very hard of hearing, not in distress HEENT: Moist mucosa, supple neck Chest: Clear CVs: Normal S1-S2 GI: Soft, nondistended, nontender Musculoskeletal: Warm, no edema   Data Reviewed: I have personally reviewed following labs and imaging studies  CBC: Recent Labs  Lab 09/30/19 1636 10/01/19 0452  WBC 9.4 7.4  HGB 11.8* 11.0*  HCT 36.3* 35.1*  MCV 92.4 94.1  PLT 201 181    Basic Metabolic Panel: Recent Labs  Lab 09/30/19 1636 10/01/19 0452  NA 136 140  K 3.9 3.6  CL 106 109  CO2 21* 23  GLUCOSE 116* 94  BUN 30* 30*  CREATININE 1.40* 1.34*  CALCIUM 8.9 8.8*    GFR: Estimated Creatinine Clearance: 34.5 mL/min (  A) (by C-G formula based on SCr of 1.34 mg/dL (H)).  Liver Function Tests: No results for input(s): AST, ALT, ALKPHOS, BILITOT, PROT, ALBUMIN in the last 168 hours.  CBG: No results for input(s): GLUCAP in the last 168 hours.   Recent Results (from the past 240 hour(s))  Respiratory Panel by RT PCR (Flu A&B, Covid) - Nasopharyngeal Swab     Status: None   Collection Time: 09/30/19  7:45 PM   Specimen: Nasopharyngeal Swab  Result Value Ref Range Status   SARS Coronavirus 2 by RT PCR NEGATIVE NEGATIVE Final    Comment: (NOTE) SARS-CoV-2 target nucleic acids are NOT DETECTED. The SARS-CoV-2 RNA is generally detectable in upper respiratoy specimens during the acute phase of infection. The  lowest concentration of SARS-CoV-2 viral copies this assay can detect is 131 copies/mL. A negative result does not preclude SARS-Cov-2 infection and should not be used as the sole basis for treatment or other patient management decisions. A negative result may occur with  improper specimen collection/handling, submission of specimen other than nasopharyngeal swab, presence of viral mutation(s) within the areas targeted by this assay, and inadequate number of viral copies (<131 copies/mL). A negative result must be combined with clinical observations, patient history, and epidemiological information. The expected result is Negative. Fact Sheet for Patients:  https://www.moore.com/ Fact Sheet for Healthcare Providers:  https://www.young.biz/ This test is not yet ap proved or cleared by the [place] FDA and  has been authorized for detection and/or diagnosis of SARS-CoV-2 by FDA under an Emergency Use Authorization (EUA). This EUA will remain  in effect (meaning this test can be used) for the duration of the COVID-19 declaration under Section 564(b)(1) of the Act, 21 U.S.C. section 360bbb-3(b)(1), unless the authorization is terminated or revoked sooner.    Influenza A by PCR NEGATIVE NEGATIVE Final   Influenza B by PCR NEGATIVE NEGATIVE Final    Comment: (NOTE) The Xpert Xpress SARS-CoV-2/FLU/RSV assay is intended as an aid in  the diagnosis of influenza from Nasopharyngeal swab specimens and  should not be used as a sole basis for treatment. Nasal washings and  aspirates are unacceptable for Xpert Xpress SARS-CoV-2/FLU/RSV  testing. Fact Sheet for Patients: https://www.moore.com/ Fact Sheet for Healthcare Providers: https://www.young.biz/ This test is not yet approved or cleared by the [place] FDA and  has been authorized for detection and/or diagnosis of SARS-CoV-2 by  FDA under an Emergency  Use Authorization (EUA). This EUA will remain  in effect (meaning this test can be used) for the duration of the  Covid-19 declaration under Section 564(b)(1) of the Act, 21  U.S.C. section 360bbb-3(b)(1), unless the authorization is  terminated or revoked. Performed at [facility], [address] [place], [place] [address]   Blood culture (routine x 2)     Status: None (Preliminary result)   Collection Time: 09/30/19  7:49 PM   Specimen: BLOOD  Result Value Ref Range Status   Specimen Description BLOOD LEFT ANTECUBITAL  Final   Special Requests   Final    BOTTLES DRAWN AEROBIC AND ANAEROBIC Blood Culture adequate volume   Culture   Final    NO GROWTH < 12 HOURS Performed at [facility], [address] [place], [place] [address]    Report Status PENDING  Incomplete  Blood culture (routine x 2)     Status: None (Preliminary result)   Collection Time: 09/30/19  7:52 PM   Specimen: BLOOD  Result Value Ref Range Status  Specimen Description BLOOD RIGHT ANTECUBITAL  Final   Special Requests   Final    BOTTLES DRAWN AEROBIC AND ANAEROBIC Blood Culture adequate volume   Culture   Final    NO GROWTH < 12 HOURS Performed at [facility], [address], [place], [place] [address]    Report Status PENDING  Incomplete         Radiology Studies: DG Chest 2 View  Result Date: 09/30/2019 CLINICAL DATA:  Weakness EXAM: CHEST - 2 VIEW COMPARISON:  04/10/2019 FINDINGS: Cardiac shadow is stable. Aortic calcifications are noted. The lungs are well aerated bilaterally. Minimal left basilar atelectasis is noted. No sizable effusion is seen. No focal infiltrate is noted. No acute bony abnormality is seen. IMPRESSION: Mild left basilar atelectasis. Electronically Signed   By: Inez Catalina M.D.   On: 09/30/2019 17:23        Scheduled Meds: . amLODipine  5 mg Oral Daily  . aspirin EC  81 mg Oral Daily  . atorvastatin  10 mg Oral q1800  . clopidogrel  75  mg Oral Daily  . enoxaparin (LOVENOX) injection  40 mg Subcutaneous Q24H  . metoprolol tartrate  50 mg Oral BID  . multivitamin with minerals  1 tablet Oral Daily  . tamsulosin  0.4 mg Oral Daily  . vitamin B-12  1,000 mcg Oral Daily   Continuous Infusions: . sodium chloride 100 mL/hr at 09/30/19 2326  . cefTRIAXone (ROCEPHIN)  IV       LOS: 1 day    Time spent: 25 minutes   Brynleigh Sequeira, MD [facility]   To contact the attending provider between 7A-7P or the covering provider during after hours 7P-7A, please log into the web site [email or web address] and access using universal [place] password for that web site. If you do not have the password, please call the hospital operator.  10/01/2019, 2:22 PM

## 2019-10-01 NOTE — Care Management CC44 (Signed)
Condition Code 44 Documentation Completed  Patient Details  Name: DIVANTE KOTCH MRN: [id number] Date of Birth: 07/25/1925   Condition Code 44 given:  Yes Patient signature on Condition Code 44 notice:  Yes Documentation of 2 MD's agreement:  Yes Code 44 added to claim:  Yes    Trenton Founds, RN 10/01/2019, 2:50 PM

## 2019-10-02 DIAGNOSIS — N3000 Acute cystitis without hematuria: Secondary | ICD-10-CM

## 2019-10-02 DIAGNOSIS — N179 Acute kidney failure, unspecified: Secondary | ICD-10-CM | POA: Diagnosis not present

## 2019-10-02 DIAGNOSIS — I10 Essential (primary) hypertension: Secondary | ICD-10-CM | POA: Diagnosis not present

## 2019-10-02 DIAGNOSIS — N139 Obstructive and reflux uropathy, unspecified: Secondary | ICD-10-CM

## 2019-10-02 LAB — BASIC METABOLIC PANEL
Anion gap: 6 (ref 5–15)
BUN: 28 mg/dL — ABNORMAL HIGH (ref 8–23)
CO2: 22 mmol/L (ref 22–32)
Calcium: 8.4 mg/dL — ABNORMAL LOW (ref 8.9–10.3)
Chloride: 110 mmol/L (ref 98–111)
Creatinine, Ser: 1.28 mg/dL — ABNORMAL HIGH (ref 0.61–1.24)
GFR calc Af Amer: 56 mL/min — ABNORMAL LOW (ref >60)
GFR calc non Af Amer: 49 mL/min — ABNORMAL LOW (ref >60)
Glucose, Bld: 90 mg/dL (ref 70–99)
Potassium: 3.5 mmol/L (ref 3.5–5.1)
Sodium: 138 mmol/L (ref 135–145)

## 2019-10-02 MED ORDER — CEPHALEXIN 500 MG PO CAPS: 500.0000 mg | ORAL_CAPSULE | Freq: Two times a day (BID) | ORAL | 0 refills | Status: AC

## 2019-10-02 NOTE — Plan of Care (Signed)

## 2019-10-02 NOTE — Discharge Instructions (Signed)
[facility], Physical Therapy and Occupational Therapy

## 2019-10-02 NOTE — Discharge Summary (Signed)
Physician Discharge Summary  Matthew Greer [facility] FBP:[id number] DOB: 01/07/1926 DOA: 09/30/2019  PCP: Rolm Gala, MD  Admit date: 09/30/2019 Discharge date: 10/02/2019  Admitted From: Home Disposition: Home  Recommendations for Outpatient Follow-up:  Follow-up with PCP in 1 week.  Patient will be discharged on oral Keflex for 5 more days to complete 7-day course of antibiotic for UTI.  Home Health: None Equipment/Devices: None  Discharge Condition: Fair CODE STATUS: DNR Diet recommendation: Regular   Discharge Diagnoses:  Principal Problem: Acute cystitis without hematuria  Active Problems:   Benign essential hypertension   Obstructive uropathy   AKI (acute kidney injury) (HCC)   Weakness  Brief narrative/HPI 86 year old male with a history of CVA, hypertension and severely impaired hearing presented with generalized weakness, low-grade fever 100.2 F and a possible presyncopal episode while he was at PCP office.  Patient reported having urinary urgency for past few days.  In the ED patient tested negative for COVID-19 PCR and influenza antigen.  Initial blood work was unremarkable except for mild AKI with creatinine of 1.4. UA suggestive of UTI.  Patient placed on observation .   Principal problem Urinary tract infection (HCC) Received empiric IV Rocephin.  Urine culture contaminated.  Remains afebrile and denies further urinary frequency.  Will be discharged home on oral Keflex to complete 7-day course of antibiotic.   Acute kidney injury superimposed on stage III chronic kidney disease Likely prerenal with dehydration.    Improved with gentle hydration.  History of CVA Continue aspirin, Plavix and statin  Essential hypertension Stable on amlodipine and metoprolol.  BPH Continue Flomax.  Reports doing straight cath twice daily at home.  Generalized weakness with presyncopal symptoms. Likely dehydration with UTI and AKI.  Symptoms improved.  Resumed home health  PT and RN upon discharge.  Procedure: None Disposition: Home Family communication: None  Discharge Instructions   Allergies as of 10/02/2019      Reactions   Aspirin Other (See Comments)   On plavix.  Has h.o GI bleed.   Chlorhexidine    Finasteride Nausea Only, Other (See Comments)   Reaction:  Confusion    Levofloxacin Other (See Comments)   Lisinopril Other (See Comments)   Reaction:  Confusion      Medication List    TAKE these medications   acetaminophen 325 MG tablet Commonly known as: TYLENOL Take 325-650 mg by mouth every 6 (six) hours as needed for mild pain, moderate pain, fever or headache.   amLODipine 5 MG tablet Commonly known as: NORVASC Take 1 tablet (5 mg total) by mouth daily.   aspirin 81 MG EC tablet Take 1 tablet (81 mg total) by mouth daily.   atorvastatin 10 MG tablet Commonly known as: LIPITOR Take 10 mg by mouth every evening.   cephALEXin 500 MG capsule Commonly known as: KEFLEX Take 1 capsule (500 mg total) by mouth 2 (two) times daily for 5 days. Start taking on: October 03, 2019   clobetasol cream 0.05 % Commonly known as: TEMOVATE Apply 1 application topically daily. (for up to 2 weeks)   clopidogrel 75 MG tablet Commonly known as: PLAVIX Take 75 mg by mouth daily.   losartan 25 MG tablet Commonly known as: COZAAR Take 25 mg by mouth daily.   metoprolol tartrate 50 MG tablet Commonly known as: LOPRESSOR Take 1 tablet (50 mg total) by mouth 2 (two) times daily.   multivitamin with minerals Tabs tablet Take 1 tablet by mouth daily.   tamsulosin 0.4 MG Caps  capsule Commonly known as: FLOMAX Take 1 capsule (0.4 mg total) by mouth daily.   vitamin B-12 1000 MCG tablet Commonly known as: CYANOCOBALAMIN Take 1,000 mcg by mouth daily.      Follow-up Information    Rolm Gala, MD Follow up in 2 week(s).   Specialty: Family Medicine Contact information: [address] [place] [address] [phone]           Allergies  Allergen Reactions  . Aspirin Other (See Comments)    On plavix.  Has h.o GI bleed.  . Chlorhexidine   . Finasteride Nausea Only and Other (See Comments)    Reaction:  Confusion   . Levofloxacin Other (See Comments)  . Lisinopril Other (See Comments)    Reaction:  Confusion      Procedures/Studies: DG Chest 2 View  Result Date: 09/30/2019 CLINICAL DATA:  Weakness EXAM: CHEST - 2 VIEW COMPARISON:  04/10/2019 FINDINGS: Cardiac shadow is stable. Aortic calcifications are noted. The lungs are well aerated bilaterally. Minimal left basilar atelectasis is noted. No sizable effusion is seen. No focal infiltrate is noted. No acute bony abnormality is seen. IMPRESSION: Mild left basilar atelectasis. Electronically Signed   By: Alcide Clever M.D.   On: 09/30/2019 17:23       Subjective: No overnight events.  Remains afebrile.  Denies further urinary frequency or dysuria.  Discharge Exam: Vitals:   10/02/19 0401 10/02/19 0842  BP: 139/66 140/62  Pulse: 60 (!) 58  Resp: 17 16  Temp:  98.5 F (36.9 C)  SpO2: 96% 95%   Vitals:   10/01/19 2117 10/02/19 0004 10/02/19 0401 10/02/19 0842  BP: 136/62 139/74 139/66 140/62  Pulse: 60 (!) 50 60 (!) 58  Resp:  18 17 16   Temp:    98.5 F (36.9 C)  TempSrc:    Oral  SpO2: 99% 97% 96% 95%  Weight:      Height:        General: Elderly male very hard of hearing, not in distress HEENT: Moist mucosa, supple neck Chest: Clear CVs: Normal S1-S2 GI: Soft, nondistended, nontender Musculoskeletal: Warm, no edema   The results of significant diagnostics from this hospitalization (including imaging, microbiology, ancillary and laboratory) are listed below for reference.     Microbiology: Recent Results (from the past 240 hour(s))  Urine culture     Status: Abnormal   Collection Time: 09/30/19  4:40 PM   Specimen: Urine, Clean Catch  Result Value Ref Range Status   Specimen Description   Final    URINE, CLEAN  CATCH Performed at [facility], [address], [place], [place]    Special Requests   Final    Normal Performed at [facility], [address] [place], [place]    Culture MULTIPLE SPECIES PRESENT, SUGGEST RECOLLECTION (A)  Final   Report Status 10/01/2019 FINAL  Final  Respiratory Panel by RT PCR (Flu A&B, Covid) - Nasopharyngeal Swab     Status: None   Collection Time: 09/30/19  7:45 PM   Specimen: Nasopharyngeal Swab  Result Value Ref Range Status   SARS Coronavirus 2 by RT PCR NEGATIVE NEGATIVE Final    Comment: (NOTE) SARS-CoV-2 target nucleic acids are NOT DETECTED. The SARS-CoV-2 RNA is generally detectable in upper respiratoy specimens during the acute phase of infection. The lowest concentration of SARS-CoV-2 viral copies this assay can detect is 131 copies/mL. A negative result does not preclude SARS-Cov-2 infection and should not be used  as the sole basis for treatment or other patient management decisions. A negative result may occur with  improper specimen collection/handling, submission of specimen other than nasopharyngeal swab, presence of viral mutation(s) within the areas targeted by this assay, and inadequate number of viral copies (<131 copies/mL). A negative result must be combined with clinical observations, patient history, and epidemiological information. The expected result is Negative. Fact Sheet for Patients:  PinkCheek.be Fact Sheet for Healthcare Providers:  GravelBags.it This test is not yet ap proved or cleared by the [place] FDA and  has been authorized for detection and/or diagnosis of SARS-CoV-2 by FDA under an Emergency Use Authorization (EUA). This EUA will remain  in effect (meaning this test can be used) for the duration of the COVID-19 declaration under Section 564(b)(1) of the Act, 21 U.S.C. section 360bbb-3(b)(1), unless the  authorization is terminated or revoked sooner.    Influenza A by PCR NEGATIVE NEGATIVE Final   Influenza B by PCR NEGATIVE NEGATIVE Final    Comment: (NOTE) The Xpert Xpress SARS-CoV-2/FLU/RSV assay is intended as an aid in  the diagnosis of influenza from Nasopharyngeal swab specimens and  should not be used as a sole basis for treatment. Nasal washings and  aspirates are unacceptable for Xpert Xpress SARS-CoV-2/FLU/RSV  testing. Fact Sheet for Patients: PinkCheek.be Fact Sheet for Healthcare Providers: GravelBags.it This test is not yet approved or cleared by the [place] FDA and  has been authorized for detection and/or diagnosis of SARS-CoV-2 by  FDA under an Emergency Use Authorization (EUA). This EUA will remain  in effect (meaning this test can be used) for the duration of the  Covid-19 declaration under Section 564(b)(1) of the Act, 21  U.S.C. section 360bbb-3(b)(1), unless the authorization is  terminated or revoked. Performed at [facility], [address] [place], [place] [address]   Blood culture (routine x 2)     Status: None (Preliminary result)   Collection Time: 09/30/19  7:49 PM   Specimen: BLOOD  Result Value Ref Range Status   Specimen Description BLOOD LEFT ANTECUBITAL  Final   Special Requests   Final    BOTTLES DRAWN AEROBIC AND ANAEROBIC Blood Culture adequate volume   Culture   Final    NO GROWTH 2 DAYS Performed at [facility], [address], [place], [place] [address]    Report Status PENDING  Incomplete  Blood culture (routine x 2)     Status: None (Preliminary result)   Collection Time: 09/30/19  7:52 PM   Specimen: BLOOD  Result Value Ref Range Status   Specimen Description BLOOD RIGHT ANTECUBITAL  Final   Special Requests   Final    BOTTLES DRAWN AEROBIC AND ANAEROBIC Blood Culture adequate volume   Culture   Final    NO GROWTH 2 DAYS Performed at [facility], [address] [place],  [address]    Report Status PENDING  Incomplete     Labs: BNP (last 3 results) No results for input(s): BNP in the last 8760 hours. Basic Metabolic Panel: Recent Labs  Lab 09/30/19 1636 10/01/19 0452 10/02/19 0501  NA 136 140 138  K 3.9 3.6 3.5  CL 106 109 110  CO2 21* 23 22  GLUCOSE 116* 94 90  BUN 30* 30* 28*  CREATININE 1.40* 1.34* 1.28*  CALCIUM 8.9 8.8* 8.4*   Liver Function Tests: No results for input(s): AST, ALT, ALKPHOS, BILITOT, PROT, ALBUMIN in the last 168 hours. No results for input(s):  LIPASE, AMYLASE in the last 168 hours. No results for input(s): AMMONIA in the last 168 hours. CBC: Recent Labs  Lab 09/30/19 1636 10/01/19 0452  WBC 9.4 7.4  HGB 11.8* 11.0*  HCT 36.3* 35.1*  MCV 92.4 94.1  PLT 201 181   Cardiac Enzymes: No results for input(s): CKTOTAL, CKMB, CKMBINDEX, TROPONINI in the last 168 hours. BNP: Invalid input(s): POCBNP CBG: No results for input(s): GLUCAP in the last 168 hours. D-Dimer No results for input(s): DDIMER in the last 72 hours. Hgb A1c No results for input(s): HGBA1C in the last 72 hours. Lipid Profile No results for input(s): CHOL, HDL, LDLCALC, TRIG, CHOLHDL, LDLDIRECT in the last 72 hours. Thyroid function studies No results for input(s): TSH, T4TOTAL, T3FREE, THYROIDAB in the last 72 hours.  Invalid input(s): FREET3 Anemia work up No results for input(s): VITAMINB12, FOLATE, FERRITIN, TIBC, IRON, RETICCTPCT in the last 72 hours. Urinalysis    Component Value Date/Time   COLORURINE YELLOW (A) 09/30/2019 1648   APPEARANCEUR CLOUDY (A) 09/30/2019 1648   APPEARANCEUR Cloudy (A) 08/05/2019 1151   LABSPEC 1.013 09/30/2019 1648   LABSPEC 1.010 07/12/2011 1236   PHURINE 5.0 09/30/2019 1648   GLUCOSEU NEGATIVE 09/30/2019 1648   GLUCOSEU Negative 07/12/2011 1236   HGBUR MODERATE (A) 09/30/2019 1648   BILIRUBINUR NEGATIVE 09/30/2019 1648   BILIRUBINUR Negative 08/05/2019  1151   BILIRUBINUR Negative 07/12/2011 1236   KETONESUR NEGATIVE 09/30/2019 1648   PROTEINUR 100 (A) 09/30/2019 1648   NITRITE POSITIVE (A) 09/30/2019 1648   LEUKOCYTESUR LARGE (A) 09/30/2019 1648   LEUKOCYTESUR Negative 07/12/2011 1236   Sepsis Labs Invalid input(s): PROCALCITONIN,  WBC,  LACTICIDVEN Microbiology Recent Results (from the past 240 hour(s))  Urine culture     Status: Abnormal   Collection Time: 09/30/19  4:40 PM   Specimen: Urine, Clean Catch  Result Value Ref Range Status   Specimen Description   Final    URINE, CLEAN CATCH Performed at [facility], [address] [place], [place] [address]    Special Requests   Final    Normal Performed at [facility], [address] [place], [place] [address]    Culture MULTIPLE SPECIES PRESENT, SUGGEST RECOLLECTION (A)  Final   Report Status 10/01/2019 FINAL  Final  Respiratory Panel by RT PCR (Flu A&B, Covid) - Nasopharyngeal Swab     Status: None   Collection Time: 09/30/19  7:45 PM   Specimen: Nasopharyngeal Swab  Result Value Ref Range Status   SARS Coronavirus 2 by RT PCR NEGATIVE NEGATIVE Final    Comment: (NOTE) SARS-CoV-2 target nucleic acids are NOT DETECTED. The SARS-CoV-2 RNA is generally detectable in upper respiratoy specimens during the acute phase of infection. The lowest concentration of SARS-CoV-2 viral copies this assay can detect is 131 copies/mL. A negative result does not preclude SARS-Cov-2 infection and should not be used as the sole basis for treatment or other patient management decisions. A negative result may occur with  improper specimen collection/handling, submission of specimen other than nasopharyngeal swab, presence of viral mutation(s) within the areas targeted by this assay, and inadequate number of viral copies (<131 copies/mL). A negative result must be combined with clinical observations, patient history, and epidemiological information. The expected result  is Negative. Fact Sheet for Patients:  https://www.moore.com/ Fact Sheet for Healthcare Providers:  https://www.young.biz/ This test is not yet ap proved or cleared by the [place] FDA and  has been authorized for detection and/or diagnosis of SARS-CoV-2 by FDA  under an Emergency Use Authorization (EUA). This EUA will remain  in effect (meaning this test can be used) for the duration of the COVID-19 declaration under Section 564(b)(1) of the Act, 21 U.S.C. section 360bbb-3(b)(1), unless the authorization is terminated or revoked sooner.    Influenza A by PCR NEGATIVE NEGATIVE Final   Influenza B by PCR NEGATIVE NEGATIVE Final    Comment: (NOTE) The Xpert Xpress SARS-CoV-2/FLU/RSV assay is intended as an aid in  the diagnosis of influenza from Nasopharyngeal swab specimens and  should not be used as a sole basis for treatment. Nasal washings and  aspirates are unacceptable for Xpert Xpress SARS-CoV-2/FLU/RSV  testing. Fact Sheet for Patients: https://www.moore.com/ Fact Sheet for Healthcare Providers: https://www.young.biz/ This test is not yet approved or cleared by the [place] FDA and  has been authorized for detection and/or diagnosis of SARS-CoV-2 by  FDA under an Emergency Use Authorization (EUA). This EUA will remain  in effect (meaning this test can be used) for the duration of the  Covid-19 declaration under Section 564(b)(1) of the Act, 21  U.S.C. section 360bbb-3(b)(1), unless the authorization is  terminated or revoked. Performed at [facility], [address] [place], [place] [address]   Blood culture (routine x 2)     Status: None (Preliminary result)   Collection Time: 09/30/19  7:49 PM   Specimen: BLOOD  Result Value Ref Range Status   Specimen Description BLOOD LEFT ANTECUBITAL  Final   Special Requests   Final    BOTTLES DRAWN AEROBIC AND ANAEROBIC Blood Culture  adequate volume   Culture   Final    NO GROWTH 2 DAYS Performed at [facility], [address], [place], [place] [address]    Report Status PENDING  Incomplete  Blood culture (routine x 2)     Status: None (Preliminary result)   Collection Time: 09/30/19  7:52 PM   Specimen: BLOOD  Result Value Ref Range Status   Specimen Description BLOOD RIGHT ANTECUBITAL  Final   Special Requests   Final    BOTTLES DRAWN AEROBIC AND ANAEROBIC Blood Culture adequate volume   Culture   Final    NO GROWTH 2 DAYS Performed at [facility], [address], [place], [place] [address]    Report Status PENDING  Incomplete     Time coordinating discharge: 25 minutes  SIGNED:   Eddie North, MD  [facility] 10/02/2019, 9:51 AM Pager   If 7PM-7AM, please contact night-coverage [email or web address] Password [email or web address]

## 2019-10-02 NOTE — Progress Notes (Signed)
MD order received to discharge pt home with home health today; TOC previously established Home Health Nursing, PT and OT with [facility]; verbally reviewed AVS with pt, no questions voiced at this time; pt discharged via his personal wheelchair by nursing to [facility]

## 2019-10-05 LAB — CULTURE, BLOOD (ROUTINE X 2)
Culture: NO GROWTH
Culture: NO GROWTH
Special Requests: ADEQUATE
Special Requests: ADEQUATE

## 2019-10-17 ENCOUNTER — Ambulatory Visit (INDEPENDENT_AMBULATORY_CARE_PROVIDER_SITE_OTHER): Payer: Medicare Other | Admitting: Physician Assistant

## 2019-10-17 ENCOUNTER — Other Ambulatory Visit: Payer: Self-pay

## 2019-10-17 ENCOUNTER — Encounter: Payer: Self-pay | Admitting: Physician Assistant

## 2019-10-17 DIAGNOSIS — Z87440 Personal history of urinary (tract) infections: Secondary | ICD-10-CM | POA: Diagnosis not present

## 2019-10-17 DIAGNOSIS — N3000 Acute cystitis without hematuria: Secondary | ICD-10-CM

## 2019-10-17 MED ORDER — CEFDINIR 300 MG PO CAPS: 300.0000 mg | ORAL_CAPSULE | Freq: Two times a day (BID) | ORAL | 0 refills | Status: AC

## 2019-10-17 NOTE — Patient Instructions (Signed)
1. Start taking Miralax to manage your constipation. Your goal is to have consistent, formed bowel movements that are easy for you to pass. You may adjust the recommended dose (one capful daily) up or down to achieve this goal. 2. Start taking an over-the-counter cranberry supplement for urinary tract health. Take this once or twice daily on an empty stomach, e.g. right before bed. 3. Start cathing three times daily. I will adjust your catheter supply order so that you will have enough to do this.

## 2019-10-17 NOTE — Progress Notes (Signed)
10/17/2019 1:54 PM   Matthew Greer [facility] 1925/11/23 [id number]  CC: Bladder pain  HPI: Matthew Greer is a 86 y.o. male BPH with urinary retention managed by CIC who presents today for evaluation of possible UTI.  I saw him in clinic most recently on 08/05/2019 for the same.  At the time, he reported having lost the ability to spontaneously urinate and was relying exclusively on once daily CIC to empty his bladder.  He was draining approximately 800 mL of urine from his bladder every night at that time.  UA grossly infected; urine culture ultimately grew MRSA.  I counseled him to start twice daily self-catheterization and treated him with Bactrim DS twice daily x7 days.  Patient subsequently presented to the ED on 09/30/2019 with reports of generalized weakness.  He was admitted for IV fluids and antibiotics for management of low-grade fever, mild AKI, and suspicion of UTI.  Urine culture ultimately grew multiple species, recollection suggested.  He received 2 doses of ceftriaxone IV and was discharged with a 5-day course of oral Keflex.  Today, patient reports his symptoms resolved following Keflex and he felt fine for approximately 1 week.  Thereafter, he developed lower abdominal pain relieved with catheterization.  Additionally, patient reports intermittent constipation that is chronic and stable.  He has continued to self catheterize twice daily with drainage of approximately 500 mL of urine each time.  In-office UA today positive for trace-lysed blood and 2+ leukocyte esterase; urine microscopy with >30 WBCs/HPF and many bacteria.  PMH: Past Medical History:  Diagnosis Date  . Hypertension   . Stroke [facility]) 2013   "minor" - no deficits    Surgical History: Past Surgical History:  Procedure Laterality Date  . ESOPHAGEAL DILATION  02/24/2016   Procedure: ESOPHAGEAL DILATION;  Surgeon: Midge Minium, MD;  Location: [facility];  Service: Endoscopy;;  . ESOPHAGOGASTRODUODENOSCOPY  (EGD) WITH PROPOFOL N/A 02/24/2016   Procedure: ESOPHAGOGASTRODUODENOSCOPY (EGD) WITH PROPOFOL;  Surgeon: Midge Minium, MD;  Location: [facility];  Service: Endoscopy;  Laterality: N/A;  . ESOPHAGOGASTRODUODENOSCOPY ENDOSCOPY  03/2013   Dr. Servando Snare  . THORACOTOMY  1953    Home Medications:  Allergies as of 10/17/2019      Reactions   Aspirin Other (See Comments)   On plavix.  Has h.o GI bleed.   Chlorhexidine    Finasteride Nausea Only, Other (See Comments)   Reaction:  Confusion    Levofloxacin Other (See Comments)   Lisinopril Other (See Comments)   Reaction:  Confusion      Medication List       Accurate as of Oct 17, 2019  1:54 PM. If you have any questions, ask your nurse or doctor.        acetaminophen 325 MG tablet Commonly known as: TYLENOL Take 325-650 mg by mouth every 6 (six) hours as needed for mild pain, moderate pain, fever or headache.   amLODipine 5 MG tablet Commonly known as: NORVASC Take 1 tablet (5 mg total) by mouth daily.   aspirin 81 MG EC tablet Take 1 tablet (81 mg total) by mouth daily.   atorvastatin 10 MG tablet Commonly known as: LIPITOR Take 10 mg by mouth every evening.   cefdinir 300 MG capsule Commonly known as: OMNICEF Take 1 capsule (300 mg total) by mouth 2 (two) times daily for 7 days. Started by: Carman Ching, PA-C   clobetasol cream 0.05 % Commonly known as: TEMOVATE Apply 1 application topically daily. (for up to 2  weeks)   clopidogrel 75 MG tablet Commonly known as: PLAVIX Take 75 mg by mouth daily.   losartan 25 MG tablet Commonly known as: COZAAR Take 25 mg by mouth daily.   metoprolol tartrate 50 MG tablet Commonly known as: LOPRESSOR Take 1 tablet (50 mg total) by mouth 2 (two) times daily.   multivitamin with minerals Tabs tablet Take 1 tablet by mouth daily.   tamsulosin 0.4 MG Caps capsule Commonly known as: FLOMAX Take 1 capsule (0.4 mg total) by mouth daily.   vitamin B-12 1000 MCG  tablet Commonly known as: CYANOCOBALAMIN Take 1,000 mcg by mouth daily.       Allergies:  Allergies  Allergen Reactions  . Aspirin Other (See Comments)    On plavix.  Has h.o GI bleed.  . Chlorhexidine   . Finasteride Nausea Only and Other (See Comments)    Reaction:  Confusion   . Levofloxacin Other (See Comments)  . Lisinopril Other (See Comments)    Reaction:  Confusion    Family History: No family history on file.  Social History:   reports that he has quit smoking. He has never used smokeless tobacco. He reports that he does not drink alcohol. No history on file for drug.  Physical Exam: There were no vitals taken for this visit.  Constitutional:  Alert and oriented, no acute distress, nontoxic appearing HEENT: , AT Cardiovascular: No clubbing, cyanosis, or edema Respiratory: Normal respiratory effort, no increased work of breathing Skin: No rashes, bruises or suspicious lesions Neurologic: Grossly intact, no focal deficits, moving all 4 extremities Psychiatric: Normal mood and affect  Laboratory Data: Results for orders placed or performed in visit on 10/17/19  CULTURE, URINE COMPREHENSIVE   Specimen: Urine   UR  Result Value Ref Range   Urine Culture, Comprehensive Preliminary report    Organism ID, Bacteria Comment    Organism ID, Bacteria Comment   Microscopic Examination   URINE  Result Value Ref Range   WBC, UA >30 (A) 0 - 5 /hpf   RBC 0-2 0 - 2 /hpf   Epithelial Cells (non renal) 0-10 0 - 10 /hpf   Bacteria, UA Many (A) None seen/Few  Urinalysis, Complete  Result Value Ref Range   Specific Gravity, UA 1.015 1.005 - 1.030   pH, UA 5.5 5.0 - 7.5   Color, UA Yellow Yellow   Appearance Ur Cloudy (A) Clear   Leukocytes,UA 2+ (A) Negative   Protein,UA Negative Negative/Trace   Glucose, UA Negative Negative   Ketones, UA Negative Negative   RBC, UA Trace (A) Negative   Bilirubin, UA Negative Negative   Urobilinogen, Ur 0.2 0.2 - 1.0 mg/dL    Nitrite, UA Negative Negative   Microscopic Examination See below:    Assessment & Plan:   1. Acute cystitis without hematuria 86 year old male with a history of BPH with urinary retention managed by twice daily CIC presents with recurrent bladder pain following recent hospitalization for febrile UTI.  UA notable for pyuria and bacteriuria today.  Previous urine culture with multiple species detected.  Unclear if his symptoms represent persistent versus reinfection.  Regardless, will start him on empiric cefdinir 30 mg twice daily x7 days and send urine for culture. - Urinalysis, Complete - CULTURE, URINE COMPREHENSIVE - cefdinir (OMNICEF) 300 MG capsule; Take 1 capsule (300 mg total) by mouth 2 (two) times daily for 7 days.  Dispense: 14 capsule; Refill: 0   2. History of recurrent UTI (urinary tract infection) Counseled patient  to start daily to twice daily cranberry supplements for UTI prevention and a bowel regimen of MiraLAX for management of constipation/UTI prevention.  Additionally, I counseled him to increase his frequency of catheterization to 3 times daily with concern for urine stasis as contributory to his recurrent infections.  I have updated his supply order with [facility] [employer] to reflect this change.  He expressed understanding.  Return if symptoms worsen or fail to improve.  Carman Ching, PA-C  [facility] [address] [place], [place] [address] [phone]

## 2019-10-18 LAB — URINALYSIS, COMPLETE
Bilirubin, UA: NEGATIVE
Glucose, UA: NEGATIVE
Ketones, UA: NEGATIVE
Nitrite, UA: NEGATIVE
Protein,UA: NEGATIVE
Specific Gravity, UA: 1.015 (ref 1.005–1.030)
Urobilinogen, Ur: 0.2 mg/dL (ref 0.2–1.0)
pH, UA: 5.5 (ref 5.0–7.5)

## 2019-10-18 LAB — MICROSCOPIC EXAMINATION: WBC, UA: >30 /hpf — AB (ref 0–5)

## 2019-10-20 ENCOUNTER — Telehealth: Payer: Self-pay

## 2019-10-20 LAB — CULTURE, URINE COMPREHENSIVE

## 2019-10-20 NOTE — Telephone Encounter (Signed)
I just spoke with Mr. Ida Rogue via telephone.  I explained that Matthew Greer urine culture has returned and that Macrobid is an appropriate antibiotic for treatment of his infection.  I agree with switching him from cefdinir to Macrobid to treat his current UTI.  Mr. Ida Rogue reports that Dr. Zada Finders has prescribed this medication and they will pick it up at the pharmacy today.  I am in agreement with this plan.

## 2019-10-20 NOTE — Telephone Encounter (Signed)
Incoming call from pt's caregiver stating that the pt was started on cefdinir by you, the caregiver states that they were informed today by the pt's PCP Dr. Zada Finders at [facility] to discontinue the cefdinir and begin Macrobid based on what their culture grew out. I do see that office's urine culture in care everywhere. Caregiver is confused on how to proceed. Please advise.

## 2019-10-21 ENCOUNTER — Ambulatory Visit: Payer: Medicare Other | Admitting: Physician Assistant

## 2020-07-16 DIAGNOSIS — N1831 Chronic kidney disease, stage 3a: Secondary | ICD-10-CM | POA: Insufficient documentation

## 2020-08-09 ENCOUNTER — Ambulatory Visit (INDEPENDENT_AMBULATORY_CARE_PROVIDER_SITE_OTHER): Payer: Medicare Other | Admitting: Physician Assistant

## 2020-08-09 ENCOUNTER — Encounter: Payer: Self-pay | Admitting: Physician Assistant

## 2020-08-09 ENCOUNTER — Other Ambulatory Visit: Payer: Self-pay

## 2020-08-09 VITALS — BP 138/67 | HR 76 | Ht 69.5 in | Wt 165.0 lb

## 2020-08-09 DIAGNOSIS — N401 Enlarged prostate with lower urinary tract symptoms: Secondary | ICD-10-CM | POA: Diagnosis not present

## 2020-08-09 DIAGNOSIS — R338 Other retention of urine: Secondary | ICD-10-CM | POA: Diagnosis not present

## 2020-08-09 NOTE — Progress Notes (Signed)
08/09/2020 3:59 PM   Matthew Greer [birth year] [id number]  CC: Chief Complaint  Patient presents with  . Follow-up   HPI: Matthew Greer is a 87 y.o. male with BPH with urinary retention managed by CIC who presents today for annual follow-up.  Today he reports he has been self cathing 3 times daily without difficulty.  He does receive some assistance in self-catheterization due to right-sided hemiplegia following stroke.  He states his largest urinary output is with his a.m. CIC, typically around 660 mL.  His subsequent catheterizations throughout the day produce significantly less urine.  He denies a history of UTIs prior to his most recent documented infection in May 2021.  He occasionally awakens in the middle of the night with a strong urge to urinate that is relieved with self-catheterization.  He continues to not void spontaneously.  He denies gross hematuria.    PMH: Past Medical History:  Diagnosis Date  . Hypertension   . Stroke [facility]) 2013   "minor" - no deficits    Surgical History: Past Surgical History:  Procedure Laterality Date  . ESOPHAGEAL DILATION  02/24/2016   Procedure: ESOPHAGEAL DILATION;  Surgeon: Midge Minium, MD;  Location: [facility];  Service: Endoscopy;;  . ESOPHAGOGASTRODUODENOSCOPY (EGD) WITH PROPOFOL N/A 02/24/2016   Procedure: ESOPHAGOGASTRODUODENOSCOPY (EGD) WITH PROPOFOL;  Surgeon: Midge Minium, MD;  Location: [facility];  Service: Endoscopy;  Laterality: N/A;  . ESOPHAGOGASTRODUODENOSCOPY ENDOSCOPY  03/2013   Dr. Servando Snare  . THORACOTOMY  1953    Home Medications:  Allergies as of 08/09/2020      Reactions   Aspirin Other (See Comments)   On plavix.  Has h.o GI bleed.   Cephalexin Other (See Comments)   weakness   Chlorhexidine    Finasteride Nausea Only, Other (See Comments)   Reaction:  Confusion    Levofloxacin Other (See Comments)   Lisinopril Other (See Comments)   Reaction:  Confusion      Medication List        Accurate as of August 09, 2020  3:59 PM. If you have any questions, ask your nurse or doctor.        STOP taking these medications   tamsulosin 0.4 MG Caps capsule Commonly known as: FLOMAX Stopped by: Carman Ching, PA-C     TAKE these medications   acetaminophen 325 MG tablet Commonly known as: TYLENOL Take 325-650 mg by mouth every 6 (six) hours as needed for mild pain, moderate pain, fever or headache.   amLODipine 5 MG tablet Commonly known as: NORVASC Take 1 tablet (5 mg total) by mouth daily.   aspirin 81 MG EC tablet Take 1 tablet (81 mg total) by mouth daily.   atorvastatin 10 MG tablet Commonly known as: LIPITOR Take 10 mg by mouth every evening.   clobetasol cream 0.05 % Commonly known as: TEMOVATE Apply 1 application topically daily. (for up to 2 weeks)   clopidogrel 75 MG tablet Commonly known as: PLAVIX Take 75 mg by mouth daily.   loratadine 10 MG tablet Commonly known as: CLARITIN Take 10 mg by mouth daily.   losartan 25 MG tablet Commonly known as: COZAAR Take 25 mg by mouth daily.   metoprolol tartrate 50 MG tablet Commonly known as: LOPRESSOR Take 1 tablet (50 mg total) by mouth 2 (two) times daily.   multivitamin with minerals Tabs tablet Take 1 tablet by mouth daily.   mupirocin ointment 2 % Commonly known as: BACTROBAN Apply topically 3 (three)  times daily.   Stool Softener 100 MG capsule Generic drug: docusate sodium   vitamin B-12 1000 MCG tablet Commonly known as: CYANOCOBALAMIN Take 1,000 mcg by mouth daily.       Allergies:  Allergies  Allergen Reactions  . Aspirin Other (See Comments)    On plavix.  Has h.o GI bleed.  . Cephalexin Other (See Comments)    weakness  . Chlorhexidine   . Finasteride Nausea Only and Other (See Comments)    Reaction:  Confusion   . Levofloxacin Other (See Comments)  . Lisinopril Other (See Comments)    Reaction:  Confusion    Family History: No family history on file.  Social  History:   reports that he has quit smoking. He has never used smokeless tobacco. He reports that he does not drink alcohol and does not use drugs.  Physical Exam: BP 138/67 (BP Location: Left Arm, Patient Position: Sitting, Cuff Size: Large)   Pulse 76   Ht 5' 9.5" (1.765 m)   Wt 165 lb (74.8 kg)   BMI 24.02 kg/m   Constitutional:  Alert and oriented, no acute distress, nontoxic appearing HEENT: [place], AT Cardiovascular: No clubbing, cyanosis, or edema Respiratory: Normal respiratory effort, no increased work of breathing Skin: No rashes, bruises or suspicious lesions Neurologic: Right hemiplegia Psychiatric: Normal mood and affect  Assessment & Plan:   1. Benign prostatic hyperplasia with urinary retention Well-managed with CIC 3 times daily.  Counseled patient to continue this and consider increasing frequency or possibly add one occurrence overnight to reduce overnight urinary accumulation.  We discussed adding finasteride, however he denies discomfort or difficulty with self-catheterization and has not been having any gross hematuria.  Will defer this at this time.  I think this is reasonable.  We will plan for annual follow-up, sooner if needed.  We discussed that if self-catheterization becomes too challenging for him, we may consider chronic indwelling Foley catheter versus suprapubic catheter in the future.  Patient expressed understanding.  Return in about 1 year (around 08/09/2021) for Follow up .  Carman Ching, PA-C  [facility] [address], [address] [place], [place] [address] [phone]

## 2020-09-02 ENCOUNTER — Emergency Department: Payer: Medicare Other

## 2020-09-02 ENCOUNTER — Inpatient Hospital Stay
Admission: EM | Admit: 2020-09-02 | Discharge: 2020-09-06 | DRG: 299 | Disposition: A | Discharge status: Home Health | Payer: Medicare Other | Attending: Hospitalist | Admitting: Hospitalist

## 2020-09-02 ENCOUNTER — Other Ambulatory Visit: Payer: Self-pay

## 2020-09-02 DIAGNOSIS — H9190 Unspecified hearing loss, unspecified ear: Secondary | ICD-10-CM | POA: Diagnosis present

## 2020-09-02 DIAGNOSIS — K5730 Diverticulosis of large intestine without perforation or abscess without bleeding: Secondary | ICD-10-CM | POA: Diagnosis present

## 2020-09-02 DIAGNOSIS — R042 Hemoptysis: Secondary | ICD-10-CM

## 2020-09-02 DIAGNOSIS — I319 Disease of pericardium, unspecified: Secondary | ICD-10-CM | POA: Diagnosis present

## 2020-09-02 DIAGNOSIS — Z888 Allergy status to other drugs, medicaments and biological substances status: Secondary | ICD-10-CM

## 2020-09-02 DIAGNOSIS — I63312 Cerebral infarction due to thrombosis of left middle cerebral artery: Secondary | ICD-10-CM | POA: Diagnosis not present

## 2020-09-02 DIAGNOSIS — Z66 Do not resuscitate: Secondary | ICD-10-CM | POA: Diagnosis present

## 2020-09-02 DIAGNOSIS — M19012 Primary osteoarthritis, left shoulder: Secondary | ICD-10-CM | POA: Diagnosis present

## 2020-09-02 DIAGNOSIS — K8020 Calculus of gallbladder without cholecystitis without obstruction: Secondary | ICD-10-CM | POA: Diagnosis present

## 2020-09-02 DIAGNOSIS — I2510 Atherosclerotic heart disease of native coronary artery without angina pectoris: Secondary | ICD-10-CM | POA: Diagnosis present

## 2020-09-02 DIAGNOSIS — I739 Peripheral vascular disease, unspecified: Secondary | ICD-10-CM | POA: Diagnosis present

## 2020-09-02 DIAGNOSIS — N401 Enlarged prostate with lower urinary tract symptoms: Secondary | ICD-10-CM | POA: Diagnosis not present

## 2020-09-02 DIAGNOSIS — G8191 Hemiplegia, unspecified affecting right dominant side: Secondary | ICD-10-CM

## 2020-09-02 DIAGNOSIS — R079 Chest pain, unspecified: Secondary | ICD-10-CM | POA: Diagnosis not present

## 2020-09-02 DIAGNOSIS — Z993 Dependence on wheelchair: Secondary | ICD-10-CM

## 2020-09-02 DIAGNOSIS — Z886 Allergy status to analgesic agent status: Secondary | ICD-10-CM

## 2020-09-02 DIAGNOSIS — D62 Acute posthemorrhagic anemia: Secondary | ICD-10-CM | POA: Diagnosis present

## 2020-09-02 DIAGNOSIS — R531 Weakness: Secondary | ICD-10-CM

## 2020-09-02 DIAGNOSIS — Z881 Allergy status to other antibiotic agents status: Secondary | ICD-10-CM

## 2020-09-02 DIAGNOSIS — Z7982 Long term (current) use of aspirin: Secondary | ICD-10-CM

## 2020-09-02 DIAGNOSIS — I10 Essential (primary) hypertension: Secondary | ICD-10-CM | POA: Diagnosis not present

## 2020-09-02 DIAGNOSIS — Z87891 Personal history of nicotine dependence: Secondary | ICD-10-CM

## 2020-09-02 DIAGNOSIS — I639 Cerebral infarction, unspecified: Secondary | ICD-10-CM | POA: Diagnosis present

## 2020-09-02 DIAGNOSIS — R5381 Other malaise: Secondary | ICD-10-CM | POA: Diagnosis present

## 2020-09-02 DIAGNOSIS — K226 Gastro-esophageal laceration-hemorrhage syndrome: Secondary | ICD-10-CM | POA: Diagnosis present

## 2020-09-02 DIAGNOSIS — I824Y1 Acute embolism and thrombosis of unspecified deep veins of right proximal lower extremity: Secondary | ICD-10-CM

## 2020-09-02 DIAGNOSIS — I69351 Hemiplegia and hemiparesis following cerebral infarction affecting right dominant side: Secondary | ICD-10-CM

## 2020-09-02 DIAGNOSIS — M19011 Primary osteoarthritis, right shoulder: Secondary | ICD-10-CM | POA: Diagnosis present

## 2020-09-02 DIAGNOSIS — K922 Gastrointestinal hemorrhage, unspecified: Secondary | ICD-10-CM | POA: Diagnosis present

## 2020-09-02 DIAGNOSIS — I824Z1 Acute embolism and thrombosis of unspecified deep veins of right distal lower extremity: Secondary | ICD-10-CM

## 2020-09-02 DIAGNOSIS — I440 Atrioventricular block, first degree: Secondary | ICD-10-CM | POA: Diagnosis present

## 2020-09-02 DIAGNOSIS — E785 Hyperlipidemia, unspecified: Secondary | ICD-10-CM | POA: Diagnosis present

## 2020-09-02 DIAGNOSIS — I82411 Acute embolism and thrombosis of right femoral vein: Principal | ICD-10-CM | POA: Diagnosis present

## 2020-09-02 DIAGNOSIS — Z79899 Other long term (current) drug therapy: Secondary | ICD-10-CM

## 2020-09-02 DIAGNOSIS — K449 Diaphragmatic hernia without obstruction or gangrene: Secondary | ICD-10-CM | POA: Diagnosis present

## 2020-09-02 DIAGNOSIS — I480 Paroxysmal atrial fibrillation: Secondary | ICD-10-CM | POA: Diagnosis present

## 2020-09-02 DIAGNOSIS — Z7902 Long term (current) use of antithrombotics/antiplatelets: Secondary | ICD-10-CM

## 2020-09-02 DIAGNOSIS — Z20822 Contact with and (suspected) exposure to covid-19: Secondary | ICD-10-CM | POA: Diagnosis present

## 2020-09-02 DIAGNOSIS — K640 First degree hemorrhoids: Secondary | ICD-10-CM | POA: Diagnosis present

## 2020-09-02 DIAGNOSIS — R338 Other retention of urine: Secondary | ICD-10-CM | POA: Diagnosis present

## 2020-09-02 DIAGNOSIS — M19019 Primary osteoarthritis, unspecified shoulder: Secondary | ICD-10-CM | POA: Diagnosis present

## 2020-09-02 LAB — CBC WITH DIFFERENTIAL/PLATELET
Abs Immature Granulocytes: 0.03 10*3/uL (ref 0.00–0.07)
Basophils Absolute: 0 10*3/uL (ref 0.0–0.1)
Basophils Relative: 0 %
Eosinophils Absolute: 0.1 10*3/uL (ref 0.0–0.5)
Eosinophils Relative: 1 %
HCT: 40.3 % (ref 39.0–52.0)
Hemoglobin: 13.4 g/dL (ref 13.0–17.0)
Immature Granulocytes: 0 %
Lymphocytes Relative: 15 %
Lymphs Abs: 1.6 10*3/uL (ref 0.7–4.0)
MCH: 30 pg (ref 26.0–34.0)
MCHC: 33.3 g/dL (ref 30.0–36.0)
MCV: 90.2 fL (ref 80.0–100.0)
Monocytes Absolute: 0.9 10*3/uL (ref 0.1–1.0)
Monocytes Relative: 9 %
Neutro Abs: 7.6 10*3/uL (ref 1.7–7.7)
Neutrophils Relative %: 75 %
Platelets: 267 10*3/uL (ref 150–400)
RBC: 4.47 MIL/uL (ref 4.22–5.81)
RDW: 13.4 % (ref 11.5–15.5)
WBC: 10.3 10*3/uL (ref 4.0–10.5)
nRBC: 0 % (ref 0.0–0.2)

## 2020-09-02 LAB — URINALYSIS, COMPLETE (UACMP) WITH MICROSCOPIC
Bilirubin Urine: NEGATIVE
Glucose, UA: NEGATIVE mg/dL
Ketones, ur: NEGATIVE mg/dL
Nitrite: POSITIVE — AB
Protein, ur: NEGATIVE mg/dL
Specific Gravity, Urine: 1.021 (ref 1.005–1.030)
Squamous Epithelial / HPF: NONE SEEN (ref 0–5)
WBC, UA: >50 WBC/hpf — ABNORMAL HIGH (ref 0–5)
pH: 5 (ref 5.0–8.0)

## 2020-09-02 LAB — TROPONIN I (HIGH SENSITIVITY)
Troponin I (High Sensitivity): 5 ng/L (ref <18)
Troponin I (High Sensitivity): 3 ng/L (ref <18)

## 2020-09-02 LAB — PROTIME-INR
INR: 1.2 (ref 0.8–1.2)
Prothrombin Time: 14.4 seconds (ref 11.4–15.2)

## 2020-09-02 LAB — COMPREHENSIVE METABOLIC PANEL
ALT: 14 U/L (ref 0–44)
AST: 19 U/L (ref 15–41)
Albumin: 3.8 g/dL (ref 3.5–5.0)
Alkaline Phosphatase: 89 U/L (ref 38–126)
Anion gap: 12 (ref 5–15)
BUN: 24 mg/dL — ABNORMAL HIGH (ref 8–23)
CO2: 21 mmol/L — ABNORMAL LOW (ref 22–32)
Calcium: 9.7 mg/dL (ref 8.9–10.3)
Chloride: 107 mmol/L (ref 98–111)
Creatinine, Ser: 1.41 mg/dL — ABNORMAL HIGH (ref 0.61–1.24)
GFR, Estimated: 47 mL/min — ABNORMAL LOW (ref >60)
Glucose, Bld: 106 mg/dL — ABNORMAL HIGH (ref 70–99)
Potassium: 3.6 mmol/L (ref 3.5–5.1)
Sodium: 140 mmol/L (ref 135–145)
Total Bilirubin: 1 mg/dL (ref 0.3–1.2)
Total Protein: 7 g/dL (ref 6.5–8.1)

## 2020-09-02 LAB — CK: Total CK: 39 U/L — ABNORMAL LOW (ref 49–397)

## 2020-09-02 LAB — RESP PANEL BY RT-PCR (FLU A&B, COVID) ARPGX2
Influenza A by PCR: NEGATIVE
Influenza B by PCR: NEGATIVE
SARS Coronavirus 2 by RT PCR: NEGATIVE

## 2020-09-02 LAB — LIPASE, BLOOD: Lipase: 40 U/L (ref 11–51)

## 2020-09-02 LAB — BRAIN NATRIURETIC PEPTIDE: B Natriuretic Peptide: 104.8 pg/mL — ABNORMAL HIGH (ref 0.0–100.0)

## 2020-09-02 LAB — APTT: aPTT: 38 seconds — ABNORMAL HIGH (ref 24–36)

## 2020-09-02 MED ORDER — ACETAMINOPHEN 500 MG PO TABS: 1000.0000 mg | ORAL_TABLET | Freq: Once | ORAL | Status: DC

## 2020-09-02 MED ORDER — AMLODIPINE BESYLATE 5 MG PO TABS
5.0000 mg | ORAL_TABLET | Freq: Every day | ORAL | Status: DC
  Administered 2020-09-02 – 2020-09-06 (×5): 5 mg via ORAL
  Filled 2020-09-02 (×6): qty 1

## 2020-09-02 MED ORDER — ONDANSETRON HCL 4 MG/2ML IJ SOLN
4.0000 mg | Freq: Once | INTRAMUSCULAR | Status: AC
  Administered 2020-09-02: 4 mg via INTRAVENOUS
  Filled 2020-09-02: qty 2

## 2020-09-02 MED ORDER — ONDANSETRON HCL 4 MG/2ML IJ SOLN: 4.0000 mg | Freq: Four times a day (QID) | INTRAMUSCULAR | Status: DC | PRN

## 2020-09-02 MED ORDER — CLOPIDOGREL BISULFATE 75 MG PO TABS
75.0000 mg | ORAL_TABLET | Freq: Every day | ORAL | Status: DC
  Administered 2020-09-02 – 2020-09-03 (×2): 75 mg via ORAL
  Filled 2020-09-02 (×3): qty 1

## 2020-09-02 MED ORDER — HEPARIN (PORCINE) 25000 UT/250ML-% IV SOLN
1300.0000 [IU]/h | INTRAVENOUS | Status: DC
  Administered 2020-09-02: 1300 [IU]/h via INTRAVENOUS
  Filled 2020-09-02: qty 250

## 2020-09-02 MED ORDER — HEPARIN BOLUS VIA INFUSION
4500.0000 [IU] | Freq: Once | INTRAVENOUS | Status: AC
  Administered 2020-09-02: 4500 [IU] via INTRAVENOUS
  Filled 2020-09-02: qty 4500

## 2020-09-02 MED ORDER — ATORVASTATIN CALCIUM 10 MG PO TABS
10.0000 mg | ORAL_TABLET | Freq: Every evening | ORAL | Status: DC
  Administered 2020-09-02 – 2020-09-05 (×4): 10 mg via ORAL
  Filled 2020-09-02 (×4): qty 1

## 2020-09-02 MED ORDER — LOSARTAN POTASSIUM 25 MG PO TABS
25.0000 mg | ORAL_TABLET | Freq: Every day | ORAL | Status: DC
Start: 2020-09-02 — End: 2020-09-06
  Administered 2020-09-02 – 2020-09-06 (×5): 25 mg via ORAL
  Filled 2020-09-02 (×6): qty 1

## 2020-09-02 MED ORDER — MORPHINE SULFATE (PF) 2 MG/ML IV SOLN
2.0000 mg | Freq: Once | INTRAVENOUS | Status: AC
  Administered 2020-09-02: 2 mg via INTRAVENOUS
  Filled 2020-09-02: qty 1

## 2020-09-02 MED ORDER — TRAMADOL HCL 50 MG PO TABS
50.0000 mg | ORAL_TABLET | Freq: Four times a day (QID) | ORAL | Status: DC | PRN
  Administered 2020-09-02 – 2020-09-06 (×4): 50 mg via ORAL
  Filled 2020-09-02 (×4): qty 1

## 2020-09-02 MED ORDER — ADULT MULTIVITAMIN W/MINERALS CH
1.0000 | ORAL_TABLET | Freq: Every day | ORAL | Status: DC
  Administered 2020-09-02 – 2020-09-06 (×5): 1 via ORAL
  Filled 2020-09-02 (×6): qty 1

## 2020-09-02 MED ORDER — METOPROLOL TARTRATE 50 MG PO TABS
50.0000 mg | ORAL_TABLET | Freq: Two times a day (BID) | ORAL | Status: DC
  Administered 2020-09-02 – 2020-09-06 (×9): 50 mg via ORAL
  Filled 2020-09-02 (×10): qty 1

## 2020-09-02 MED ORDER — ACETAMINOPHEN 650 MG RE SUPP: 650.0000 mg | Freq: Four times a day (QID) | RECTAL | Status: AC | PRN

## 2020-09-02 MED ORDER — VITAMIN B-12 1000 MCG PO TABS
1000.0000 ug | ORAL_TABLET | Freq: Every day | ORAL | Status: DC
  Administered 2020-09-02 – 2020-09-06 (×4): 1000 ug via ORAL
  Filled 2020-09-02 (×6): qty 1

## 2020-09-02 MED ORDER — MORPHINE SULFATE (PF) 2 MG/ML IV SOLN
2.0000 mg | INTRAVENOUS | Status: DC | PRN
Start: 2020-09-02 — End: 2020-09-03
  Administered 2020-09-03: 2 mg via INTRAVENOUS
  Filled 2020-09-02: qty 1

## 2020-09-02 MED ORDER — IOHEXOL 350 MG/ML SOLN
85.0000 mL | Freq: Once | INTRAVENOUS | Status: AC | PRN
  Administered 2020-09-02: 75 mL via INTRAVENOUS

## 2020-09-02 MED ORDER — ONDANSETRON HCL 4 MG PO TABS: 4.0000 mg | ORAL_TABLET | Freq: Four times a day (QID) | ORAL | Status: DC | PRN

## 2020-09-02 MED ORDER — ACETAMINOPHEN 325 MG PO TABS
650.0000 mg | ORAL_TABLET | Freq: Four times a day (QID) | ORAL | Status: AC | PRN
  Administered 2020-09-02: 650 mg via ORAL
  Filled 2020-09-02: qty 2

## 2020-09-02 MED ORDER — LORATADINE 10 MG PO TABS
10.0000 mg | ORAL_TABLET | Freq: Every day | ORAL | Status: DC
  Administered 2020-09-02 – 2020-09-06 (×5): 10 mg via ORAL
  Filled 2020-09-02 (×6): qty 1

## 2020-09-02 NOTE — Consult Note (Signed)
ANTICOAGULATION CONSULT NOTE - Initial Consult  Pharmacy Consult for heparin drip Indication: DVT  Allergies  Allergen Reactions  . Aspirin Other (See Comments)    On plavix.  Has h.o GI bleed.  . Cephalexin Other (See Comments)    weakness  . Chlorhexidine   . Finasteride Nausea Only and Other (See Comments)    Reaction:  Confusion   . Levofloxacin Other (See Comments)  . Lisinopril Other (See Comments)    Reaction:  Confusion    Patient Measurements: Weight: 79.6 kg (175 lb 7.8 oz) Heparin Dosing Weight: 79.6kg  Vital Signs: Temp: 97.5 F (36.4 C) (03/31 0919) Temp Source: Oral (03/31 0919) BP: 164/81 (03/31 1200) Pulse Rate: 76 (03/31 1330)  Labs: Recent Labs    09/02/20 0938 09/02/20 1145  HGB 13.4  --   HCT 40.3  --   PLT 267  --   CREATININE 1.41*  --   CKTOTAL 39*  --   TROPONINIHS 5 3    Estimated Creatinine Clearance: 34 mL/min (A) (by C-G formula based on SCr of 1.41 mg/dL (H)).   Medical History: Past Medical History:  Diagnosis Date  . Hypertension   . Stroke [facility]) 2013   "minor" - no deficits    Medications:  No PTA anticoagulation of record - of note pt on clopidogrel/asa 81mg .   Assessment: 87 yo male with pmh htn/stroke presenting with CP without elevated troponins and 99 with evidence of DVT.    Goal of Therapy:  Heparin level 0.3-0.7 units/ml Monitor platelets by anticoagulation protocol: Yes   Plan:  Will obtain INR/aPTT  Will start 4500 unit loading dose heparin, followed by 1300 units/hr  Will obtain heparin level in 8 hours after start of infusion per protocol.     [place], PharmD, BCPS Clinical Pharmacist 09/02/2020 2:30 PM

## 2020-09-02 NOTE — ED Notes (Signed)
Pt resting quietly. Call light in reach. Pts partner at bedside. Fall precautions in place.

## 2020-09-02 NOTE — ED Notes (Signed)
Transport nurse reported when she went to get patient to take him upstairs, pt was choking on food. Pt may have aspirated.

## 2020-09-02 NOTE — ED Triage Notes (Signed)
Cp that started at 0300 this morning.CP radiates down left arm and per ems appears to be generalized and made worse with movement. HOH and hx of stroke 4 yrs ago with preexisting right side facial droop. Pt is bedbound from home. Baseline hypertensive no meds this morning, ems initiated 20g iv in left hand.

## 2020-09-02 NOTE — H&P (Signed)
History and Physical   Matthew Greer YHC:[id number] DOB: 20-Feb-1926 DOA: 09/02/2020  PCP: Rolm Gala, MD  Outpatient Specialists: Dr. Cherylann Ratel, nephrology Patient coming from: home   I have personally briefly reviewed patient's old medical records in [facility] EMR.  Chief Concern: right leg pain   HPI: Matthew Greer is a 87 y.o. male with medical history significant for Prior CVA with right-sided hemiparesis, Hypertension, peripheral vascular disease, presents to the emergency department for chief concerns of right leg pain.    Patient states that the pain started at approximately 3 AM on day of presentation.  He denies trauma.  Patient is mostly bedbound and wheelchair-bound due to history of hemiparesis from stroke.  He denies fever, nausea, vomiting, shortness of breath, abdominal pain.  He does endorse generalized pain everywhere in his body.  He denies dysuria.  He states that the pain in his leg is a 10 out of 10.  He states that he does not know how to make the pain better.  Social history: Patient has same-sex relationship and lives with his partner.  He denies tobacco, EtOH, recreational drug use.  Vaccination: Patient is vaccinated for COVID-19, 2 doses  ROS: Constitutional: no weight change, no fever ENT/Mouth: no sore throat, no rhinorrhea Eyes: no eye pain, no vision changes Cardiovascular: + chest pain, no dyspnea,  no edema, no palpitations Respiratory: no cough, no sputum, no wheezing Gastrointestinal: no nausea, no vomiting, no diarrhea, no constipation Genitourinary: no urinary incontinence, no dysuria, no hematuria Musculoskeletal: no arthralgias, + myalgias Skin: no skin lesions, no pruritus, Neuro: + weakness, no loss of consciousness, no syncope Psych: no anxiety, no depression, + decrease appetite Heme/Lymph: no bruising, no bleeding  ED Course: Discussed with ED provider, patient requiring hospitalization due to right lower extremity femoral  DVT.  Vitals in the emergency department was remarkable for temperature of 97.5, respiration rate of 17, heart rate of 76, blood pressure 164/81, patient saturating at 98% on room air.  CBC was unremarkable. BMP was remarkable for serum creatinine of 1.41 which is at patient's baseline, nonfasting blood glucose of 106, BUN 23, bicarb 21, BNP elevated at 104.8, troponin was 5 and decreased to 3.  Covid test was negative.  Assessment/Plan  Principal Problem:   Right femoral vein DVT (HCC) Active Problems:   Degenerative joint disease of shoulder region   Benign essential hypertension   Hearing loss   CVA (cerebral vascular accident) (HCC)   Weakness   Benign prostatic hyperplasia with urinary retention   Right hemiplegia (HCC)   Right femoral DVT-Heparin GTT continue -Suspect secondary to chronic bedbound state -CTA of the chest for PE was negative for pulmonary embolism. -Discussed with patient that he will need p.o. anticoagulation on discharge  CAD-resumed home Plavix, holding aspirin as patient is now on heparin GTT  Cholelithiasis-asymptomatic  Hypertension-resumed home metoprolol 50 mg twice daily, amlodipine 5 mg daily, losartan 25 mg daily  Hyperlipidemia-atorvastatin 10 mg daily in the evening  Debility-patient gets daily PT at home, we will resume this here  Chart reviewed.   DVT prophylaxis: Heparin GTT Code Status: DNR Diet: Heart healthy Family Communication: Discussed with partner at bedside Disposition Plan: Pending clinical course Consults called: None at this time Admission status: Progressive cardiac, observation, telemetry  Past Medical History:  Diagnosis Date  . Hypertension   . Stroke [facility]) 2013   "minor" - no deficits   Past Surgical History:  Procedure Laterality Date  . ESOPHAGEAL DILATION  02/24/2016  Procedure: ESOPHAGEAL DILATION;  Surgeon: Midge Minium, MD;  Location: [facility];  Service: Endoscopy;;  .  ESOPHAGOGASTRODUODENOSCOPY (EGD) WITH PROPOFOL N/A 02/24/2016   Procedure: ESOPHAGOGASTRODUODENOSCOPY (EGD) WITH PROPOFOL;  Surgeon: Midge Minium, MD;  Location: [facility];  Service: Endoscopy;  Laterality: N/A;  . ESOPHAGOGASTRODUODENOSCOPY ENDOSCOPY  03/2013   Dr. Servando Snare  . THORACOTOMY  1953   Social History:  reports that he has quit smoking. He has never used smokeless tobacco. He reports that he does not drink alcohol and does not use drugs.  Allergies  Allergen Reactions  . Aspirin Other (See Comments)    On plavix.  Has h.o GI bleed.  . Cephalexin Other (See Comments)    weakness  . Chlorhexidine   . Finasteride Nausea Only and Other (See Comments)    Reaction:  Confusion   . Levofloxacin Other (See Comments)  . Lisinopril Other (See Comments)    Reaction:  Confusion   History reviewed. No pertinent family history. Family history: Family history reviewed and not pertinent  Prior to Admission medications   Medication Sig Start Date End Date Taking? Authorizing Provider  acetaminophen (TYLENOL) 325 MG tablet Take 325-650 mg by mouth every 6 (six) hours as needed for mild pain, moderate pain, fever or headache.     [provider]  amLODipine (NORVASC) 5 MG tablet Take 1 tablet (5 mg total) by mouth daily. 04/13/19   Enedina Finner, MD  aspirin EC 81 MG EC tablet Take 1 tablet (81 mg total) by mouth daily. 04/13/19   Enedina Finner, MD  atorvastatin (LIPITOR) 10 MG tablet Take 10 mg by mouth every evening.     [provider]  clobetasol cream (TEMOVATE) 0.05 % Apply 1 application topically daily. (for up to 2 weeks) 09/15/19   [provider]  clopidogrel (PLAVIX) 75 MG tablet Take 75 mg by mouth daily.    [provider]  loratadine (CLARITIN) 10 MG tablet Take 10 mg by mouth daily.    [provider]  losartan (COZAAR) 25 MG tablet Take 25 mg by mouth daily. 09/15/19   [provider]  metoprolol (LOPRESSOR) 50 MG tablet Take  1 tablet (50 mg total) by mouth 2 (two) times daily. 05/28/16   Adrian Saran, MD  Multiple Vitamin (MULTIVITAMIN WITH MINERALS) TABS tablet Take 1 tablet by mouth daily.    [provider]  mupirocin ointment (BACTROBAN) 2 % Apply topically 3 (three) times daily. 07/16/20   [provider]  STOOL SOFTENER 100 MG capsule  04/22/20   [provider]  vitamin B-12 (CYANOCOBALAMIN) 1000 MCG tablet Take 1,000 mcg by mouth daily.    [provider]   Physical Exam: Vitals:   09/02/20 1030 09/02/20 1145 09/02/20 1200 09/02/20 1330  BP: 124/71  (!) 164/81   Pulse: 71 74 76 76  Resp:  20 (!) 8 17  Temp:      TempSrc:      SpO2: 100% 100% 100% 98%  Weight:       Constitutional: appears age-appropriate, frail, chronically ill, NAD, calm, comfortable Eyes: PERRL, lids and conjunctivae normal ENMT: Mucous membranes are moist. Posterior pharynx clear of any exudate or lesions. Age-appropriate dentition.  Hearing loss Neck: normal, supple, no masses, no thyromegaly Respiratory: clear to auscultation bilaterally, no wheezing, no crackles. Normal respiratory effort. No accessory muscle use.  Cardiovascular: Regular rate and rhythm, no murmurs / rubs / gallops. No extremity edema. 2+ pedal pulses. No carotid bruits.  Abdomen:  no tenderness, no masses palpated, no hepatosplenomegaly. Bowel sounds positive.  Musculoskeletal: no clubbing / cyanosis. No joint deformity upper and lower extremities. Good ROM, no contractures, no atrophy. Normal muscle tone.  Skin: no rashes, lesions, ulcers. No induration Neurologic: Sensation intact. Strength 5/5 in all left upper and lower extremity.  No strength in the right upper extremity.Marland Kitchen  Psychiatric: Normal judgment and insight. Alert and oriented x 3. Normal mood.   EKG: independently reviewed, showing Normal sinus rhythm with rate of 72, QTc 439  Chest x-ray on Admission: I personally reviewed and I agree with radiologist reading  as below.  CT Head Wo Contrast  Result Date: 09/02/2020 CLINICAL DATA:  Headache. EXAM: CT HEAD WITHOUT CONTRAST TECHNIQUE: Contiguous axial images were obtained from the base of the skull through the vertex without intravenous contrast. COMPARISON:  None. FINDINGS: Brain: Mild diffuse cortical atrophy is noted. Mild chronic ischemic white matter disease is noted. Old lacunar infarction is noted in left basal ganglia. No mass effect or midline shift is noted. Ventricular size is within normal limits. There is no evidence of mass lesion, hemorrhage or acute infarction. Vascular: No hyperdense vessel or unexpected calcification. Skull: Normal. Negative for fracture or focal lesion. Sinuses/Orbits: No acute finding. Other: None. IMPRESSION: Mild diffuse cortical atrophy. Mild chronic ischemic white matter disease. No acute intracranial abnormality seen. Electronically Signed   By: Lupita Raider M.D.   On: 09/02/2020 13:06   CT Angio Chest PE W and/or Wo Contrast  Result Date: 09/02/2020 CLINICAL DATA:  Chest pain radiating to the left arm EXAM: CT ANGIOGRAPHY CHEST WITH CONTRAST TECHNIQUE: Multidetector CT imaging of the chest was performed using the standard protocol during bolus administration of intravenous contrast. Multiplanar CT image reconstructions and MIPs were obtained to evaluate the vascular anatomy. CONTRAST:  62mL OMNIPAQUE IOHEXOL 350 MG/ML SOLN COMPARISON:  05/28/2017, 07/18/2011 FINDINGS: Cardiovascular: Satisfactory opacification of the pulmonary arteries to the segmental level. No evidence of pulmonary embolism. Thoracic aorta is nonaneurysmal. Atherosclerotic calcifications of the aorta and coronary arteries. Normal heart size. No pericardial effusion. Mediastinum/Nodes: Chronically prominent mediastinal and bilateral hilar lymph nodes, some of which contain punctate calcifications. Findings suggest sequela of chronic granulomatous disease. No enlarging mediastinal or hilar lymph nodes. No  axillary lymphadenopathy. Thyroid unremarkable. Trachea within normal limits. Moderate-sized hiatal hernia. Lungs/Pleura: Prominent biapical pleuroparenchymal scarring. Benign 5 mm nodule at the right lung apex, unchanged. Dependent bibasilar subsegmental atelectasis. No airspace consolidation, pleural effusion, or pneumothorax. Upper Abdomen: Cholelithiasis. Musculoskeletal: No acute osseous findings. Advanced bilateral glenohumeral joint osteoarthritis, left worse than right. Review of the MIP images confirms the above findings. IMPRESSION: 1. Negative for pulmonary embolism or other acute intrathoracic process. 2. Chronically prominent mediastinal and bilateral hilar lymph nodes, some of which contain punctate calcifications. Findings suggest sequela of chronic granulomatous disease. 3. Moderate-sized hiatal hernia. 4. Cholelithiasis. 5. Aortic and coronary artery atherosclerosis (ICD10-I70.0). Electronically Signed   By: Duanne Guess D.O.   On: 09/02/2020 13:10   US Venous Img Lower Unilateral Right  Result Date: 09/02/2020 CLINICAL DATA:  Pain EXAM: RIGHT LOWER EXTREMITY VENOUS DOPPLER ULTRASOUND TECHNIQUE: Gray-scale sonography with compression, as well as color and duplex ultrasound, were performed to evaluate the deep venous system(s) from the level of the common femoral vein through the popliteal and proximal calf veins. COMPARISON:  None. FINDINGS: VENOUS Short segment partially occlusive thrombus within the femoral vein. Otherwise, normal compressibility of the common femoral, superficial femoral, and popliteal veins, as well as the visualized calf veins.  Visualized portions of profunda femoral vein and great saphenous vein unremarkable. Besides the femoral vein, no other filling defects to suggest DVT on grayscale or color Doppler imaging. Limited views of the contralateral common femoral vein are unremarkable. IMPRESSION: Short segment partially occlusive thrombus within the right femoral vein.  Electronically Signed   By: Feliberto Harts MD   On: 09/02/2020 11:31   DG Chest Portable 1 View  Result Date: 09/02/2020 CLINICAL DATA:  Chest pain EXAM: PORTABLE CHEST 1 VIEW COMPARISON:  09/30/2019 FINDINGS: Cardiomediastinal silhouette and pulmonary vasculature are within normal limits. Small hiatal hernia. Bibasilar atelectasis/scarring again seen. IMPRESSION: No acute cardiopulmonary process. Electronically Signed   By: Acquanetta Belling M.D.   On: 09/02/2020 10:53   Labs on Admission: I have personally reviewed following labs  CBC: Recent Labs  Lab 09/02/20 0938  WBC 10.3  NEUTROABS 7.6  HGB 13.4  HCT 40.3  MCV 90.2  PLT 267   Basic Metabolic Panel: Recent Labs  Lab 09/02/20 0938  NA 140  K 3.6  CL 107  CO2 21*  GLUCOSE 106*  BUN 24*  CREATININE 1.41*  CALCIUM 9.7   GFR: Estimated Creatinine Clearance: 34 mL/min (A) (by C-G formula based on SCr of 1.41 mg/dL (H)).  Liver Function Tests: Recent Labs  Lab 09/02/20 0938  AST 19  ALT 14  ALKPHOS 89  BILITOT 1.0  PROT 7.0  ALBUMIN 3.8   Recent Labs  Lab 09/02/20 0938  LIPASE 40   Cardiac Enzymes: Recent Labs  Lab 09/02/20 0938  CKTOTAL 39*   Urine analysis:    Component Value Date/Time   COLORURINE YELLOW (A) 09/30/2019 1648   APPEARANCEUR Cloudy (A) 10/17/2019 1042   LABSPEC 1.013 09/30/2019 1648   LABSPEC 1.010 07/12/2011 1236   PHURINE 5.0 09/30/2019 1648   GLUCOSEU Negative 10/17/2019 1042   GLUCOSEU Negative 07/12/2011 1236   HGBUR MODERATE (A) 09/30/2019 1648   BILIRUBINUR Negative 10/17/2019 1042   BILIRUBINUR Negative 07/12/2011 1236   KETONESUR NEGATIVE 09/30/2019 1648   PROTEINUR Negative 10/17/2019 1042   PROTEINUR 100 (A) 09/30/2019 1648   NITRITE Negative 10/17/2019 1042   NITRITE POSITIVE (A) 09/30/2019 1648   LEUKOCYTESUR 2+ (A) 10/17/2019 1042   LEUKOCYTESUR LARGE (A) 09/30/2019 1648   LEUKOCYTESUR Negative 07/12/2011 1236   Iness Pangilinan N Neale Marzette D.O. [facility]  If 7PM-7AM,  please contact overnight-coverage provider If 7AM-7PM, please contact day coverage provider [email or web address]  09/02/2020, 2:29 PM

## 2020-09-02 NOTE — ED Provider Notes (Signed)
[facility] Emergency Department Provider Note  ____________________________________________   Event Date/Time   First MD Initiated Contact with Patient 09/02/20 [phone]     (approximate)  I have reviewed the triage vital signs and the nursing notes.   HISTORY  Chief Complaint Chest Pain    HPI Matthew Greer is a 87 y.o. male with hypertension and prior stroke with some baseline right arm weakness with a little bit of right facial droop and right leg weakness who comes in with chest pain.  Patient had chest pain that started at 3 AM.  Patient states that the chest pains on the left side of his chest into his shoulder, constant, nothing makes better, nothing makes it worse.  Patient does have hearing impairment so is very difficult for him to hear me but he denies any abdominal pain.  He states that his right leg is always a little bit weaker than his left but still able to lift both legs up off the bed.  Denies any vision changes.  Patient also reports that his right leg just "feels weird" but is hard to get a great understand what that means to him          Past Medical History:  Diagnosis Date  . Hypertension   . Stroke  [facility]) 2013   "minor" - no deficits    Patient Active Problem List   Diagnosis Date Noted  . Benign prostatic hyperplasia with urinary retention 08/09/2020  . UTI (urinary tract infection) 10/01/2019  . Weakness 09/30/2019  . HTN (hypertension), malignant   . Acute cystitis without hematuria   . AKI (acute kidney injury) (HCC)   . Hearing impaired person, bilateral   . Obstructive uropathy 04/10/2019  . Acute CVA (cerebrovascular accident) (HCC) 04/10/2019  . Acute blood loss anemia 08/15/2016  . Hip hematoma, right, initial encounter 08/15/2016  . CVA (cerebral vascular accident) (HCC) 05/25/2016  . Problems with swallowing and mastication   . Stricture and stenosis of esophagus   . Benign essential hypertension 01/27/2016  .  Kidney stones 01/27/2016  . Other nonspecific abnormal finding of lung field 01/27/2016  . Pure hypercholesterolemia 01/27/2016  . Stroke (HCC) 01/27/2016  . 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 Rotator cuff tendonitis 11/10/2013  . Hearing loss 04/09/2013  . Eczema 07/26/2012  . Abdominal pain 12/27/2011    Past Surgical History:  Procedure Laterality Date  . ESOPHAGEAL DILATION  02/24/2016   Procedure: ESOPHAGEAL DILATION;  Surgeon: Midge Minium, MD;  Location: [facility];  Service: Endoscopy;;  . ESOPHAGOGASTRODUODENOSCOPY (EGD) WITH PROPOFOL N/A 02/24/2016   Procedure: ESOPHAGOGASTRODUODENOSCOPY (EGD) WITH PROPOFOL;  Surgeon: Midge Minium, MD;  Location: [facility];  Service: Endoscopy;  Laterality: N/A;  . ESOPHAGOGASTRODUODENOSCOPY ENDOSCOPY  03/2013   Dr. Servando Snare  . THORACOTOMY  1953    Prior to Admission medications   Medication Sig Start Date End Date Taking? Authorizing Provider  acetaminophen (TYLENOL) 325 MG tablet Take 325-650 mg by mouth every 6 (six) hours as needed for mild pain, moderate pain, fever or headache.     [provider]  amLODipine (NORVASC) 5 MG tablet Take 1 tablet (5 mg total) by mouth daily. 04/13/19   Enedina Finner, MD  aspirin EC 81 MG EC tablet Take 1 tablet (81 mg total) by mouth daily. 04/13/19   Enedina Finner, MD  atorvastatin (LIPITOR) 10 MG tablet Take 10 mg by mouth every evening.     [provider]  clobetasol cream (TEMOVATE) 0.05 % Apply 1 application topically daily. (for up to 2 weeks) 09/15/19   [provider]  clopidogrel (PLAVIX) 75 MG tablet Take 75 mg by mouth daily.    [provider]  loratadine (CLARITIN) 10 MG tablet Take 10 mg by mouth daily.    [provider]  losartan (COZAAR) 25 MG tablet Take 25 mg by mouth daily. 09/15/19   [provider]  metoprolol (LOPRESSOR) 50 MG tablet Take 1 tablet (50 mg total) by mouth 2 (two) times daily. 05/28/16    Adrian SaranMody, Sital, MD  Multiple Vitamin (MULTIVITAMIN WITH MINERALS) TABS tablet Take 1 tablet by mouth daily.    [provider]  mupirocin ointment (BACTROBAN) 2 % Apply topically 3 (three) times daily. 07/16/20   [provider]  STOOL SOFTENER 100 MG capsule  04/22/20   [provider]  vitamin B-12 (CYANOCOBALAMIN) 1000 MCG tablet Take 1,000 mcg by mouth daily.    [provider]    Allergies Aspirin, Cephalexin, Chlorhexidine, Finasteride, Levofloxacin, and Lisinopril  No family history on file.  Social History Social History   Tobacco Use  . Smoking status: Former Games developermoker  . Smokeless tobacco: Never Used  Substance Use Topics  . Alcohol use: No  . Drug use: Never      Review of Systems Constitutional: No fever/chills Eyes: No visual changes. ENT: No sore throat. Cardiovascular: Positive chest pain Respiratory: Denies shortness of breath. Gastrointestinal: No abdominal pain.  No nausea, no vomiting.  No diarrhea.  No constipation. Genitourinary: Negative for dysuria. Musculoskeletal: Negative for back pain.  Leg pain Skin: Negative for rash. Neurological: Negative for headaches, focal weakness or numbness. All other ROS negative ____________________________________________   PHYSICAL EXAM:  VITAL SIGNS: Blood pressure 124/71, pulse 71, temperature (!) 97.5 F (36.4 C), temperature source Oral, resp. rate 19, weight 79.6 kg, SpO2 100 %.  Constitutional: Alert and oriented.  Very hard of hearing Eyes: Conjunctivae are normal. EOMI. Head: Atraumatic. Nose: No congestion/rhinnorhea. Mouth/Throat: Mucous membranes are moist.   Neck: No stridor. Trachea Midline. FROM Cardiovascular: Normal rate, regular rhythm. Grossly normal heart sounds.  Good peripheral circulation. Respiratory: Normal respiratory effort.  No retractions. Lungs CTAB. Gastrointestinal: Soft and nontender. No distention. No abdominal bruits.  Musculoskeletal: There  are some slight redness noted to the right foot but 2+ distal pulse.  No joint effusions. Neurologic: Baseline right-sided facial droop, right arm weakness with contracture and slight right leg weakness that patient states is baseline for him Skin:  Skin is warm, dry and intact. No rash noted. Psychiatric: Mood and affect are normal. Speech and behavior are normal. GU: Deferred   ____________________________________________   LABS (all labs ordered are listed, but only abnormal results are displayed)  Labs Reviewed  COMPREHENSIVE METABOLIC PANEL - Abnormal; Notable for the following components:      Result Value   CO2 21 (*)    Glucose, Bld 106 (*)    BUN 24 (*)    Creatinine, Ser 1.41 (*)    GFR, Estimated 47 (*)    All other components within normal limits  BRAIN NATRIURETIC PEPTIDE - Abnormal; Notable for the following components:   B Natriuretic Peptide 104.8 (*)    All other components within normal limits  RESP PANEL BY RT-PCR (FLU A&B, COVID) ARPGX2  CBC WITH DIFFERENTIAL/PLATELET  LIPASE, BLOOD  URINALYSIS, COMPLETE (UACMP) WITH MICROSCOPIC  TROPONIN I (HIGH SENSITIVITY)  TROPONIN I (HIGH SENSITIVITY)   ____________________________________________   ED ECG  REPORT I, Concha Se, the attending physician, personally viewed and interpreted this ECG.  Normal sinus rate of 72, no ST elevation, no T wave inversions, type I AV block ____________________________________________  RADIOLOGY Vela Prose, personally viewed and evaluated these images (plain radiographs) as part of my medical decision making, as well as reviewing the written report by the radiologist.  ED MD interpretation: No pneumonia on chest x-ray  Official radiology report(s): CT Head Wo Contrast  Result Date: 09/02/2020 CLINICAL DATA:  Headache. EXAM: CT HEAD WITHOUT CONTRAST TECHNIQUE: Contiguous axial images were obtained from the base of the skull through the vertex without intravenous contrast.  COMPARISON:  None. FINDINGS: Brain: Mild diffuse cortical atrophy is noted. Mild chronic ischemic white matter disease is noted. Old lacunar infarction is noted in left basal ganglia. No mass effect or midline shift is noted. Ventricular size is within normal limits. There is no evidence of mass lesion, hemorrhage or acute infarction. Vascular: No hyperdense vessel or unexpected calcification. Skull: Normal. Negative for fracture or focal lesion. Sinuses/Orbits: No acute finding. Other: None. IMPRESSION: Mild diffuse cortical atrophy. Mild chronic ischemic white matter disease. No acute intracranial abnormality seen. Electronically Signed   By: Lupita Raider M.D.   On: 09/02/2020 13:06   CT Angio Chest PE W and/or Wo Contrast  Result Date: 09/02/2020 CLINICAL DATA:  Chest pain radiating to the left arm EXAM: CT ANGIOGRAPHY CHEST WITH CONTRAST TECHNIQUE: Multidetector CT imaging of the chest was performed using the standard protocol during bolus administration of intravenous contrast. Multiplanar CT image reconstructions and MIPs were obtained to evaluate the vascular anatomy. CONTRAST:  79mL OMNIPAQUE IOHEXOL 350 MG/ML SOLN COMPARISON:  05/28/2017, 07/18/2011 FINDINGS: Cardiovascular: Satisfactory opacification of the pulmonary arteries to the segmental level. No evidence of pulmonary embolism. Thoracic aorta is nonaneurysmal. Atherosclerotic calcifications of the aorta and coronary arteries. Normal heart size. No pericardial effusion. Mediastinum/Nodes: Chronically prominent mediastinal and bilateral hilar lymph nodes, some of which contain punctate calcifications. Findings suggest sequela of chronic granulomatous disease. No enlarging mediastinal or hilar lymph nodes. No axillary lymphadenopathy. Thyroid unremarkable. Trachea within normal limits. Moderate-sized hiatal hernia. Lungs/Pleura: Prominent biapical pleuroparenchymal scarring. Benign 5 mm nodule at the right lung apex, unchanged. Dependent  bibasilar subsegmental atelectasis. No airspace consolidation, pleural effusion, or pneumothorax. Upper Abdomen: Cholelithiasis. Musculoskeletal: No acute osseous findings. Advanced bilateral glenohumeral joint osteoarthritis, left worse than right. Review of the MIP images confirms the above findings. IMPRESSION: 1. Negative for pulmonary embolism or other acute intrathoracic process. 2. Chronically prominent mediastinal and bilateral hilar lymph nodes, some of which contain punctate calcifications. Findings suggest sequela of chronic granulomatous disease. 3. Moderate-sized hiatal hernia. 4. Cholelithiasis. 5. Aortic and coronary artery atherosclerosis (ICD10-I70.0). Electronically Signed   By: Duanne Guess D.O.   On: 09/02/2020 13:10   US Venous Img Lower Unilateral Right  Result Date: 09/02/2020 CLINICAL DATA:  Pain EXAM: RIGHT LOWER EXTREMITY VENOUS DOPPLER ULTRASOUND TECHNIQUE: Gray-scale sonography with compression, as well as color and duplex ultrasound, were performed to evaluate the deep venous system(s) from the level of the common femoral vein through the popliteal and proximal calf veins. COMPARISON:  None. FINDINGS: VENOUS Short segment partially occlusive thrombus within the femoral vein. Otherwise, normal compressibility of the common femoral, superficial femoral, and popliteal veins, as well as the visualized calf veins. Visualized portions of profunda femoral vein and great saphenous vein unremarkable. Besides the femoral vein, no other filling defects to suggest DVT on grayscale or color Doppler imaging. Limited  views of the contralateral common femoral vein are unremarkable. IMPRESSION: Short segment partially occlusive thrombus within the right femoral vein. Electronically Signed   By: Feliberto Harts MD   On: 09/02/2020 11:31   DG Chest Portable 1 View  Result Date: 09/02/2020 CLINICAL DATA:  Chest pain EXAM: PORTABLE CHEST 1 VIEW COMPARISON:  09/30/2019 FINDINGS:  Cardiomediastinal silhouette and pulmonary vasculature are within normal limits. Small hiatal hernia. Bibasilar atelectasis/scarring again seen. IMPRESSION: No acute cardiopulmonary process. Electronically Signed   By: Acquanetta Belling M.D.   On: 09/02/2020 10:53    ____________________________________________   PROCEDURES  Procedure(s) performed (including Critical Care):  .1-3 Lead EKG Interpretation Performed by: Concha Se, MD Authorized by: Concha Se, MD     Interpretation: normal     ECG rate:  70s   ECG rate assessment: normal     Rhythm: sinus rhythm     Ectopy: none     Conduction: abnormal     Abnormal conduction: 1st degree AV block    .Critical Care Performed by: Concha Se, MD Authorized by: Concha Se, MD   Critical care provider statement:    Critical care time (minutes):  45   Critical care was necessary to treat or prevent imminent or life-threatening deterioration of the following conditions: DVT requiring heparin.   Critical care was time spent personally by me on the following activities:  Discussions with consultants, evaluation of patient's response to treatment, examination of patient, ordering and performing treatments and interventions, ordering and review of laboratory studies, ordering and review of radiographic studies, pulse oximetry, re-evaluation of patient's condition, obtaining history from patient or surrogate and review of old charts     ____________________________________________   INITIAL IMPRESSION / ASSESSMENT AND PLAN / ED COURSE   Matthew Greer was evaluated in Emergency Department on 09/02/2020 for the symptoms described in the history of present illness. He was evaluated in the context of the global COVID-19 pandemic, which necessitated consideration that the patient might be at risk for infection with the SARS-CoV-2 virus that causes COVID-19. Institutional protocols and algorithms that pertain to the evaluation of patients  at risk for COVID-19 are in a state of rapid change based on information released by regulatory bodies including the CDC and federal and state organizations. These policies and algorithms were followed during the patient's care in the ED.    Most Likely DDx:  -Given patient's age I am concerned about the possibility of ACS.  Certainly difficult to get a great story with going on given patient is so hard of hearing.  There is potentially some tenderness on his chest wall when I push he is also reporting some right leg pain.  Will get ultrasound to evaluate for DVT.  He does have a good distal pulse.  Patient was given small dose of morphine and Zofran to help with pain and nausea  DDx that was also considered d/t potential to cause harm, but was found less likely based on history and physical (as detailed above): -PNA (no fevers, cough but CXR to evaluate) -PNX (reassured with equal b/l breath sounds, CXR to evaluate) -Symptomatic anemia (will get H&H) -Pulmonary embolism as no sob at rest, not pleuritic in nature, no hypoxia -Aortic Dissection as no tearing pain and no radiation to the mid back, pulses equal -Pericarditis no rub on exam, EKG changes or hx to suggest dx -Tamponade (no notable SOB, tachycardic, hypotensive) -Esophageal rupture (no h/o diffuse vomitting/no crepitus)   Labs  are reassuring kidney function around baseline at 1.41.  However DVT ultrasound was positive for small nonocclusive DVT.  Patient is already on Plavix.  Patient CT scan was negative for PE.  Patient still having pain in his upper left chest.  Discussed with his partner about the next steps and given continued chest pain in the setting of DVT I think would be better to admit patient for monitoring to make sure he does not start bleeding.  We will add on CK level.       ____________________________________________   FINAL CLINICAL IMPRESSION(S) / ED DIAGNOSES   Final diagnoses:  Chest pain, unspecified  type  Acute deep vein thrombosis (DVT) of proximal vein of right lower extremity (HCC)     MEDICATIONS GIVEN DURING THIS VISIT:  Medications  acetaminophen (TYLENOL) tablet 1,000 mg (has no administration in time range)  morphine 2 MG/ML injection 2 mg (2 mg Intravenous Given 09/02/20 1047)  ondansetron (ZOFRAN) injection 4 mg (4 mg Intravenous Given 09/02/20 1047)  iohexol (OMNIPAQUE) 350 MG/ML injection 85 mL (75 mLs Intravenous Contrast Given 09/02/20 1223)     ED Discharge Orders    None       Note:  This document was prepared using Dragon voice recognition software and may include unintentional dictation errors.   Concha Se, MD 09/02/20 [phone]

## 2020-09-02 NOTE — ED Notes (Signed)
Pts bedding changed and incontinence care provided. Pt resting quietly. Pts partner at bedside. Call light in reach. Fall precautions in place.

## 2020-09-03 ENCOUNTER — Observation Stay: Payer: Medicare Other

## 2020-09-03 ENCOUNTER — Encounter
Admission: EM | Disposition: A | Discharge status: Home Health | Payer: Self-pay | Source: Home / Self Care | Attending: Hospitalist

## 2020-09-03 ENCOUNTER — Inpatient Hospital Stay: Payer: Medicare Other | Admitting: Anesthesiology

## 2020-09-03 DIAGNOSIS — M19012 Primary osteoarthritis, left shoulder: Secondary | ICD-10-CM | POA: Diagnosis present

## 2020-09-03 DIAGNOSIS — Z888 Allergy status to other drugs, medicaments and biological substances status: Secondary | ICD-10-CM | POA: Diagnosis not present

## 2020-09-03 DIAGNOSIS — K226 Gastro-esophageal laceration-hemorrhage syndrome: Secondary | ICD-10-CM | POA: Diagnosis present

## 2020-09-03 DIAGNOSIS — Z993 Dependence on wheelchair: Secondary | ICD-10-CM | POA: Diagnosis not present

## 2020-09-03 DIAGNOSIS — E785 Hyperlipidemia, unspecified: Secondary | ICD-10-CM | POA: Diagnosis present

## 2020-09-03 DIAGNOSIS — I739 Peripheral vascular disease, unspecified: Secondary | ICD-10-CM | POA: Diagnosis present

## 2020-09-03 DIAGNOSIS — K922 Gastrointestinal hemorrhage, unspecified: Secondary | ICD-10-CM | POA: Diagnosis not present

## 2020-09-03 DIAGNOSIS — R079 Chest pain, unspecified: Secondary | ICD-10-CM | POA: Diagnosis present

## 2020-09-03 DIAGNOSIS — Z87891 Personal history of nicotine dependence: Secondary | ICD-10-CM | POA: Diagnosis not present

## 2020-09-03 DIAGNOSIS — K8020 Calculus of gallbladder without cholecystitis without obstruction: Secondary | ICD-10-CM | POA: Diagnosis present

## 2020-09-03 DIAGNOSIS — Z66 Do not resuscitate: Secondary | ICD-10-CM | POA: Diagnosis present

## 2020-09-03 DIAGNOSIS — Z881 Allergy status to other antibiotic agents status: Secondary | ICD-10-CM | POA: Diagnosis not present

## 2020-09-03 DIAGNOSIS — Z20822 Contact with and (suspected) exposure to covid-19: Secondary | ICD-10-CM | POA: Diagnosis present

## 2020-09-03 DIAGNOSIS — R338 Other retention of urine: Secondary | ICD-10-CM | POA: Diagnosis present

## 2020-09-03 DIAGNOSIS — N401 Enlarged prostate with lower urinary tract symptoms: Secondary | ICD-10-CM | POA: Diagnosis present

## 2020-09-03 DIAGNOSIS — Z886 Allergy status to analgesic agent status: Secondary | ICD-10-CM | POA: Diagnosis not present

## 2020-09-03 DIAGNOSIS — I10 Essential (primary) hypertension: Secondary | ICD-10-CM | POA: Diagnosis present

## 2020-09-03 DIAGNOSIS — D62 Acute posthemorrhagic anemia: Secondary | ICD-10-CM | POA: Diagnosis present

## 2020-09-03 DIAGNOSIS — M19011 Primary osteoarthritis, right shoulder: Secondary | ICD-10-CM | POA: Diagnosis present

## 2020-09-03 DIAGNOSIS — R5381 Other malaise: Secondary | ICD-10-CM | POA: Diagnosis present

## 2020-09-03 DIAGNOSIS — H9190 Unspecified hearing loss, unspecified ear: Secondary | ICD-10-CM | POA: Diagnosis present

## 2020-09-03 DIAGNOSIS — I82411 Acute embolism and thrombosis of right femoral vein: Secondary | ICD-10-CM | POA: Diagnosis present

## 2020-09-03 DIAGNOSIS — I2510 Atherosclerotic heart disease of native coronary artery without angina pectoris: Secondary | ICD-10-CM | POA: Diagnosis present

## 2020-09-03 DIAGNOSIS — Z79899 Other long term (current) drug therapy: Secondary | ICD-10-CM | POA: Diagnosis not present

## 2020-09-03 DIAGNOSIS — R042 Hemoptysis: Secondary | ICD-10-CM | POA: Diagnosis not present

## 2020-09-03 DIAGNOSIS — I319 Disease of pericardium, unspecified: Secondary | ICD-10-CM | POA: Diagnosis present

## 2020-09-03 DIAGNOSIS — I69351 Hemiplegia and hemiparesis following cerebral infarction affecting right dominant side: Secondary | ICD-10-CM | POA: Diagnosis not present

## 2020-09-03 HISTORY — PX: ESOPHAGOGASTRODUODENOSCOPY: SHX5428

## 2020-09-03 HISTORY — PX: FLEXIBLE SIGMOIDOSCOPY: SHX5431

## 2020-09-03 LAB — BASIC METABOLIC PANEL
Anion gap: 6 (ref 5–15)
BUN: 45 mg/dL — ABNORMAL HIGH (ref 8–23)
CO2: 21 mmol/L — ABNORMAL LOW (ref 22–32)
Calcium: 9.2 mg/dL (ref 8.9–10.3)
Chloride: 111 mmol/L (ref 98–111)
Creatinine, Ser: 1.22 mg/dL (ref 0.61–1.24)
GFR, Estimated: 56 mL/min — ABNORMAL LOW (ref >60)
Glucose, Bld: 124 mg/dL — ABNORMAL HIGH (ref 70–99)
Potassium: 4.2 mmol/L (ref 3.5–5.1)
Sodium: 138 mmol/L (ref 135–145)

## 2020-09-03 LAB — CBC
HCT: 35 % — ABNORMAL LOW (ref 39.0–52.0)
Hemoglobin: 11.4 g/dL — ABNORMAL LOW (ref 13.0–17.0)
MCH: 29.9 pg (ref 26.0–34.0)
MCHC: 32.6 g/dL (ref 30.0–36.0)
MCV: 91.9 fL (ref 80.0–100.0)
Platelets: 240 10*3/uL (ref 150–400)
RBC: 3.81 MIL/uL — ABNORMAL LOW (ref 4.22–5.81)
RDW: 13.6 % (ref 11.5–15.5)
WBC: 14.2 10*3/uL — ABNORMAL HIGH (ref 4.0–10.5)
nRBC: 0 % (ref 0.0–0.2)

## 2020-09-03 LAB — GLUCOSE, CAPILLARY: Glucose-Capillary: 126 mg/dL — ABNORMAL HIGH (ref 70–99)

## 2020-09-03 LAB — HEPARIN LEVEL (UNFRACTIONATED)
Heparin Unfractionated: 1.26 IU/mL — ABNORMAL HIGH (ref 0.30–0.70)
Heparin Unfractionated: 1.25 IU/mL — ABNORMAL HIGH (ref 0.30–0.70)

## 2020-09-03 LAB — HEMOGLOBIN AND HEMATOCRIT, BLOOD
HCT: 34.4 % — ABNORMAL LOW (ref 39.0–52.0)
Hemoglobin: 11 g/dL — ABNORMAL LOW (ref 13.0–17.0)

## 2020-09-03 SURGERY — EGD (ESOPHAGOGASTRODUODENOSCOPY)
Anesthesia: General

## 2020-09-03 MED ORDER — EPHEDRINE SULFATE 50 MG/ML IJ SOLN
INTRAMUSCULAR | Status: DC | PRN
  Administered 2020-09-03: 10 mg via INTRAVENOUS

## 2020-09-03 MED ORDER — SUCCINYLCHOLINE CHLORIDE 20 MG/ML IJ SOLN
INTRAMUSCULAR | Status: DC | PRN
  Administered 2020-09-03: 120 mg via INTRAVENOUS

## 2020-09-03 MED ORDER — PROPOFOL 10 MG/ML IV BOLUS
INTRAVENOUS | Status: DC | PRN
  Administered 2020-09-03: 100 mg via INTRAVENOUS

## 2020-09-03 MED ORDER — PROPOFOL 10 MG/ML IV BOLUS
INTRAVENOUS | Status: AC
  Filled 2020-09-03: qty 20

## 2020-09-03 MED ORDER — ONDANSETRON HCL 4 MG/2ML IJ SOLN
INTRAMUSCULAR | Status: DC | PRN
  Administered 2020-09-03: 4 mg via INTRAVENOUS

## 2020-09-03 MED ORDER — SUCCINYLCHOLINE CHLORIDE 200 MG/10ML IV SOSY
PREFILLED_SYRINGE | INTRAVENOUS | Status: AC
  Filled 2020-09-03: qty 10

## 2020-09-03 MED ORDER — ONDANSETRON HCL 4 MG/2ML IJ SOLN
INTRAMUSCULAR | Status: AC
  Filled 2020-09-03: qty 2

## 2020-09-03 MED ORDER — LIDOCAINE HCL (CARDIAC) PF 100 MG/5ML IV SOSY
PREFILLED_SYRINGE | INTRAVENOUS | Status: DC | PRN
  Administered 2020-09-03: 100 mg via INTRAVENOUS

## 2020-09-03 MED ORDER — SODIUM CHLORIDE 0.9 % IV SOLN: INTRAVENOUS | Status: DC

## 2020-09-03 MED ORDER — HEPARIN (PORCINE) 25000 UT/250ML-% IV SOLN
1000.0000 [IU]/h | INTRAVENOUS | Status: DC
  Administered 2020-09-03: 1000 [IU]/h via INTRAVENOUS
  Filled 2020-09-03: qty 250

## 2020-09-03 MED ORDER — FENTANYL CITRATE (PF) 100 MCG/2ML IJ SOLN
INTRAMUSCULAR | Status: AC
  Filled 2020-09-03: qty 2

## 2020-09-03 NOTE — Progress Notes (Signed)
Patient noted to have large BM with dark red blood and clots.  Dr. Fran Lowes made aware, GI consult in for patient and will repeat hemoglobin.  Resting without complaints.

## 2020-09-03 NOTE — Transfer of Care (Signed)
Immediate Anesthesia Transfer of Care Note  Patient: Matthew Greer  Procedure(s) Performed: ESOPHAGOGASTRODUODENOSCOPY (EGD) (N/A ) FLEXIBLE SIGMOIDOSCOPY (N/A )  Patient Location: PACU  Anesthesia Type:General  Level of Consciousness: awake, alert  and oriented  Airway & Oxygen Therapy: Patient Spontanous Breathing and Patient connected to face mask oxygen  Post-op Assessment: Report given to RN and Post -op Vital signs reviewed and stable  Post vital signs: Reviewed and stable  Last Vitals:  Vitals Value Taken Time  BP 129/77 09/03/20 1601  Temp 36.3 C 09/03/20 1601  Pulse 67 09/03/20 1612  Resp 15 09/03/20 1612  SpO2 100 % 09/03/20 1612  Vitals shown include unvalidated device data.  Last Pain:  Vitals:   09/03/20 1601  TempSrc:   PainSc: 0-No pain      Patients Stated Pain Goal: 0 (09/03/20 0746)  Complications: No complications documented.

## 2020-09-03 NOTE — Op Note (Signed)
[facility] Patient Name: Matthew Greer Procedure Date: 09/03/2020 2:49 PM MRN: [id number] Account #: [id number] Date of Birth: Jan 30, 1926 Admit Type: Inpatient Age: 87 Room: [facility] ENDO ROOM 3 Gender: Male Note Status: Finalized Procedure:             Flexible Sigmoidoscopy Indications:           Hematochezia Providers:             Boykin Nearing. Mieke Brinley MD, MD Medicines:             Propofol per Anesthesia Complications:         No immediate complications. Estimated blood loss: None. Procedure:             Pre-Anesthesia Assessment:                        - The risks and benefits of the procedure and the                         sedation options and risks were discussed with the                         patient. All questions were answered and informed                         consent was obtained.                        - Patient identification and proposed procedure were                         verified prior to the procedure by the nurse. The                         procedure was verified in the procedure room.                        - ASA Grade Assessment: III - A patient with severe                         systemic disease.                        - After reviewing the risks and benefits, the patient                         was deemed in satisfactory condition to undergo the                         procedure.                        After obtaining informed consent, the scope was passed                         under direct vision. The Colonoscope was introduced                         through the anus and advanced to the the descending  colon. The flexible sigmoidoscopy was somewhat                         difficult due to poor endoscopic visualization.                         Successful completion of the procedure was aided by                         lavage. The quality of the bowel preparation was                          unprepped. The patient tolerated the procedure well. Findings:      The perianal and digital rectal examinations were normal. Pertinent       negatives include normal sphincter tone and no palpable rectal lesions.      Non-bleeding internal hemorrhoids were found during retroflexion. The       hemorrhoids were Grade I (internal hemorrhoids that do not prolapse).      Many small and large-mouthed diverticula were found in the sigmoid colon.      Red blood was found in the rectum, in the sigmoid colon and in the       descending colon. Fluid aspiration was performed. No biopsies or other       specimens were collected for this exam.      There was no evidence of active bleeding in the examined segments of the       colon. Impression:            - Non-bleeding internal hemorrhoids.                        - Diverticulosis in the sigmoid colon. NO active                         bleeding of diverticuli suggesting UGI source of bleed                         (Mallory-Weiss tear).                        - Blood in the rectum, in the sigmoid colon and in the                         descending colon. Fluid aspiration performed. No                         specimens collected. Recommendation:        - Observe patient in hospital for ongoing care.                        - Consider IVC filter for treatment of DVT instead of                         antiplatelet medication.                        - clear liquid diet. Procedure Code(s):     --- Professional ---  28413, Sigmoidoscopy, flexible; diagnostic, including                         collection of specimen(s) by brushing or washing, when                         performed (separate procedure) Diagnosis Code(s):     --- Professional ---                        K57.30, Diverticulosis of large intestine without                         perforation or abscess without bleeding                        K92.1, Melena (includes  Hematochezia)                        K92.2, Gastrointestinal hemorrhage, unspecified                        K62.5, Hemorrhage of anus and rectum                        K64.0, First degree hemorrhoids CPT copyright 2019 [employer] Association. All rights reserved. The codes documented in this report are preliminary and upon coder review may  be revised to meet current compliance requirements. Stanton Kidney MD, MD 09/03/2020 3:51:38 PM This report has been signed electronically. Number of Addenda: 0 Note Initiated On: 09/03/2020 2:49 PM Total Procedure Duration: 0 hours 7 minutes 23 seconds  Estimated Blood Loss:  Estimated blood loss: none.      [facility]

## 2020-09-03 NOTE — Progress Notes (Signed)
RN was getting ready to place foley cath and found pt in bed with blood around mouth, his neck, back, and chest. Pt denies pain. Vitals stable except HR---140s. Rapid response was called, charge RN was notified as well, and MD on call. Heparin drip was stopped.   Labs was sent as ordered. EKG was done showing afib with RVR. CXR was also done. Pt showing no acute distress.   Foley cath inserted. Pt tolerated well. Report was endorsed to the oncoming RN. Will continue to monitor the patient.

## 2020-09-03 NOTE — Plan of Care (Signed)
Pt was having a hard time urinating. Foley inserted as ordered. Pt tolerated well. Pt's VSS. Pt medicated for pain. Verbalized relief. All needs attended. Call light is within reach. Safety measures maintained. Will continue to monitor.    Problem: Education: Goal: Knowledge of General Education information will improve Description: Including pain rating scale, medication(s)/side effects and non-pharmacologic comfort measures Outcome: Progressing   Problem: Health Behavior/Discharge Planning: Goal: Ability to manage health-related needs will improve Outcome: Progressing   Problem: Clinical Measurements: Goal: Ability to maintain clinical measurements within normal limits will improve Outcome: Progressing Goal: Will remain free from infection Outcome: Progressing Goal: Diagnostic test results will improve Outcome: Progressing Goal: Respiratory complications will improve Outcome: Progressing Goal: Cardiovascular complication will be avoided Outcome: Progressing   Problem: Activity: Goal: Risk for activity intolerance will decrease Outcome: Progressing   Problem: Nutrition: Goal: Adequate nutrition will be maintained Outcome: Progressing   Problem: Coping: Goal: Level of anxiety will decrease Outcome: Progressing   Problem: Elimination: Goal: Will not experience complications related to bowel motility Outcome: Progressing Goal: Will not experience complications related to urinary retention Outcome: Progressing   Problem: Pain Managment: Goal: General experience of comfort will improve Outcome: Progressing   Problem: Safety: Goal: Ability to remain free from injury will improve Outcome: Progressing   Problem: Skin Integrity: Goal: Risk for impaired skin integrity will decrease Outcome: Progressing

## 2020-09-03 NOTE — Anesthesia Preprocedure Evaluation (Signed)
Anesthesia Evaluation  Patient identified by MRN, date of birth, ID band Patient awake    Reviewed: Allergy & Precautions, NPO status , Patient's Chart, lab work & pertinent test results  History of Anesthesia Complications Negative for: history of anesthetic complications  Airway Mallampati: II  TM Distance: >3 FB Neck ROM: Full    Dental  (+) Poor Dentition, Missing   Pulmonary neg sleep apnea, neg COPD, former smoker,    breath sounds clear to auscultation- rhonchi (-) wheezing      Cardiovascular hypertension, Pt. on medications (-) CAD, (-) Past MI, (-) Cardiac Stents and (-) CABG  Rhythm:Regular Rate:Normal - Systolic murmurs and - Diastolic murmurs    Neuro/Psych neg Seizures CVA negative psych ROS   GI/Hepatic Neg liver ROS, GIB    Endo/Other  negative endocrine ROSneg diabetes  Renal/GU Renal InsufficiencyRenal disease     Musculoskeletal  (+) Arthritis ,   Abdominal (+) - obese,   Peds  Hematology  (+) anemia ,   Anesthesia Other Findings Past Medical History: No date: Hypertension 2013: Stroke (HCC)     Comment:  "minor" - no deficits   Reproductive/Obstetrics                             Anesthesia Physical Anesthesia Plan  ASA: II  Anesthesia Plan: General   Post-op Pain Management:    Induction: Intravenous, Cricoid pressure planned and Rapid sequence  PONV Risk Score and Plan: 1 and Ondansetron  Airway Management Planned: Oral ETT  Additional Equipment:   Intra-op Plan:   Post-operative Plan: Extubation in OR  Informed Consent: I have reviewed the patients History and Physical, chart, labs and discussed the procedure including the risks, benefits and alternatives for the proposed anesthesia with the patient or authorized representative who has indicated his/her understanding and acceptance.     Dental advisory given  Plan Discussed with: CRNA and  Anesthesiologist  Anesthesia Plan Comments:         Anesthesia Quick Evaluation

## 2020-09-03 NOTE — Anesthesia Procedure Notes (Signed)
Procedure Name: Intubation Date/Time: 09/03/2020 3:11 PM Performed by: Karoline Caldwell, CRNA Pre-anesthesia Checklist: Patient identified, Patient being monitored, Timeout performed, Emergency Drugs available and Suction available Patient Re-evaluated:Patient Re-evaluated prior to induction Oxygen Delivery Method: Circle system utilized Preoxygenation: Pre-oxygenation with 100% oxygen Induction Type: IV induction, Rapid sequence and Cricoid Pressure applied Laryngoscope Size: McGraph and 4 Grade View: Grade I Tube type: Oral Tube size: 7.0 mm Number of attempts: 1 Airway Equipment and Method: Stylet and Video-laryngoscopy Placement Confirmation: ETT inserted through vocal cords under direct vision,  positive ETCO2 and breath sounds checked- equal and bilateral Secured at: 22 cm Tube secured with: Tape Dental Injury: Teeth and Oropharynx as per pre-operative assessment  Comments: Dark red/Kroeker blood noted in oropharynx, suctioned prior to and after intubation.  ETT suctioned prior to ventilation, No blood noted from ETT. SP02 WNL throughout induction/intubation

## 2020-09-03 NOTE — H&P (View-Only) (Signed)
GI Inpatient Consult Note  Reason for Consult: Acute post hemorrhagic anemia, hematochezia, hematemesis    Attending Requesting Consult: Dr. Darlin Priestly  History of Present Illness: Matthew Greer is a 87 y.o. male seen for evaluation of acute post hemorrhagic anemia, hematochezia, and hematemesis at the request of Dr. Fran Lowes. Pt has a PMH of Hx of CVA with right-sided hemiparesis on Plavix, HTN, PVD, hearing loss, and BPH. He presented to the [facility] ED last night for chief complaint of right lower extremity leg pain. He was diagnosed with right femoral vein DVT and had CTA chest negative for PE. He was started on heparin infusion.  Rapid response was called this morning around 0630 after patient was found in the bed with blood around his mouth, neck, bach, and chest. Per hospitalist, patient had large volume emesis this morning which was frank hematemesis with some clots. His heparin infusion was stopped and stat blood draws showed stable hemoglobin at 11.4. Around 1100 this morning patient had a large volume episode of hematochezia with dark red blood and clots. GI was consulted for concern over GI bleed. Upon my examination, patient was actively having another episode of hematochezia. Patient is alert and oriented and able to respond to questions. He is very hard of hearing but is able to read lips well. He denies any abdominal pain, rectal pain, nausea, vomiting. He is tolerating his secretions fine. Hemoglobin was last checked at 1100 this morning and was stable at 11. Patient's partner is also present in room and helps to answer history questions. He reports he does not know any history of GI bleeding. There is no reported history of alcohol or recreational drug use. No history of GERD. He is not on any acid suppression therapy. No excessive use of NSAIDs. He did have outpatient endoscopy performed by Dr. Servando Snare 02/2016 for indications of dysphagia which showed an esophageal stricture which was dilated, normal  stomach and duodenum with no specimens collected.    Past Medical History:  Past Medical History:  Diagnosis Date  . Hypertension   . Stroke [facility]) 2013   "minor" - no deficits    Problem List: Patient Active Problem List   Diagnosis Date Noted  . Right femoral vein DVT (HCC) 09/02/2020  . Right hemiplegia (HCC) 09/02/2020  . Benign prostatic hyperplasia with urinary retention 08/09/2020  . UTI (urinary tract infection) 10/01/2019  . Weakness 09/30/2019  . HTN (hypertension), malignant   . Acute cystitis without hematuria   . AKI (acute kidney injury) (HCC)   . Hearing impaired person, bilateral   . Obstructive uropathy 04/10/2019  . Acute CVA (cerebrovascular accident) (HCC) 04/10/2019  . Acute blood loss anemia 08/15/2016  . Hip hematoma, right, initial encounter 08/15/2016  . CVA (cerebral vascular accident) (HCC) 05/25/2016  . Problems with swallowing and mastication   . Stricture and stenosis of esophagus   . Benign essential hypertension 01/27/2016  . Kidney stones 01/27/2016  . Other nonspecific abnormal finding of lung field 01/27/2016  . Pure hypercholesterolemia 01/27/2016  . Stroke (HCC) 01/27/2016  . 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 Rotator cuff tendonitis 11/10/2013  . Hearing loss 04/09/2013  . Eczema 07/26/2012  . Abdominal pain 12/27/2011    Past Surgical History: Past Surgical History:  Procedure Laterality Date  . ESOPHAGEAL DILATION  02/24/2016   Procedure: ESOPHAGEAL DILATION;  Surgeon: Midge Minium, MD;  Location: [facility];  Service: Endoscopy;;  . ESOPHAGOGASTRODUODENOSCOPY (EGD) WITH PROPOFOL N/A 02/24/2016  Procedure: ESOPHAGOGASTRODUODENOSCOPY (EGD) WITH PROPOFOL;  Surgeon: Midge Minium, MD;  Location: [facility];  Service: Endoscopy;  Laterality: N/A;  . ESOPHAGOGASTRODUODENOSCOPY ENDOSCOPY  03/2013   Dr. Servando Snare  . THORACOTOMY  1953    Allergies: Allergies  Allergen Reactions  . Cephalexin Other  (See Comments)    weakness  . Chlorhexidine   . Finasteride Nausea Only and Other (See Comments)    Reaction:  Confusion   . Levofloxacin Other (See Comments)  . Lisinopril Other (See Comments)    Reaction:  Confusion    Home Medications: Medications Prior to Admission  Medication Sig Dispense Refill Last Dose  . amLODipine (NORVASC) 5 MG tablet Take 1 tablet (5 mg total) by mouth daily. 30 tablet 2 09/01/2020 at AM  . aspirin EC 81 MG EC tablet Take 1 tablet (81 mg total) by mouth daily. 30 tablet 1 09/01/2020 at AM  . atorvastatin (LIPITOR) 10 MG tablet Take 10 mg by mouth every evening.    09/01/2020 at Unknown time  . clopidogrel (PLAVIX) 75 MG tablet Take 75 mg by mouth daily.   09/01/2020 at AM  . loratadine (CLARITIN) 10 MG tablet Take 10 mg by mouth daily.   09/01/2020 at AM  . losartan (COZAAR) 25 MG tablet Take 25 mg by mouth daily.   09/01/2020 at AM  . metoprolol (LOPRESSOR) 50 MG tablet Take 1 tablet (50 mg total) by mouth 2 (two) times daily. 60 tablet 0 09/01/2020 at PM  . STOOL SOFTENER 100 MG capsule    09/01/2020 at Unknown time  . acetaminophen (TYLENOL) 325 MG tablet Take 325-650 mg by mouth every 6 (six) hours as needed for mild pain, moderate pain, fever or headache.    PRN   Home medication reconciliation was completed with the patient.   Scheduled Inpatient Medications:   . acetaminophen  1,000 mg Oral Once  . amLODipine  5 mg Oral Daily  . atorvastatin  10 mg Oral QPM  . clopidogrel  75 mg Oral Daily  . loratadine  10 mg Oral Daily  . losartan  25 mg Oral Daily  . metoprolol tartrate  50 mg Oral BID  . multivitamin with minerals  1 tablet Oral Daily  . vitamin B-12  1,000 mcg Oral Daily    Continuous Inpatient Infusions:    PRN Inpatient Medications:  acetaminophen **OR** acetaminophen, ondansetron **OR** ondansetron (ZOFRAN) IV, traMADol  Family History: family history is not on file.  The patient's family history is negative for inflammatory bowel  disorders, GI malignancy, or solid organ transplantation.  Social History:   reports that he has quit smoking. He has never used smokeless tobacco. He reports that he does not drink alcohol and does not use drugs. The patient denies ETOH, tobacco, or drug use.   Review of Systems: Constitutional: Weight is stable.  Eyes: No changes in vision. ENT: No oral lesions, sore throat.  GI: see HPI.  Heme/Lymph: No easy bruising.  CV: No chest pain.  GU: No hematuria.  Integumentary: No rashes.  Neuro: No headaches.  Psych: No depression/anxiety.  Endocrine: No heat/cold intolerance.  Allergic/Immunologic: No urticaria.  Resp: No cough, SOB.  Musculoskeletal: No joint swelling.    Physical Examination: BP 133/70   Pulse 74   Temp 97.8 F (36.6 C) (Oral)   Resp 20   Wt 79.2 kg   SpO2 99%   BMI 25.41 kg/m   Frail, elderly appearing male in hospital bed. Partner present at bedside. Pt is hard  of hearing but able to answer questions.  Gen: NAD, alert and oriented x 4 HEENT: PEERLA, EOMI, Neck: supple, no JVD or thyromegaly Chest: CTA bilaterally, no wheezes, crackles, or other adventitious sounds CV: RRR, no m/g/c/r Abd: soft, NT, ND, +BS in all four quadrants; no HSM, guarding, ridigity, or rebound tenderness. There is active hematochezia and dark stool on bed sheets.  Ext: no edema, well perfused with 2+ pulses, Skin: no rash or lesions noted Lymph: no LAD  Data: Lab Results  Component Value Date   WBC 14.2 (H) 09/03/2020   HGB 11.0 (L) 09/03/2020   HCT 34.4 (L) 09/03/2020   MCV 91.9 09/03/2020   PLT 240 09/03/2020   Recent Labs  Lab 09/02/20 0938 09/03/20 0626 09/03/20 1114  HGB 13.4 11.4* 11.0*   Lab Results  Component Value Date   NA 138 09/03/2020   K 4.2 09/03/2020   CL 111 09/03/2020   CO2 21 (L) 09/03/2020   BUN 45 (H) 09/03/2020   CREATININE 1.22 09/03/2020   Lab Results  Component Value Date   ALT 14 09/02/2020   AST 19 09/02/2020   ALKPHOS 89  09/02/2020   BILITOT 1.0 09/02/2020   Recent Labs  Lab 09/02/20 1541  APTT 38*  INR 1.2   Assessment/Plan:  87 y/o Caucasian male with a PMH of Hx of CVA with right-sided hemiparesis on Plavix, HTN, PVD, hearing loss, and BPH admitted to the hospital last night for acute right leg pain with new diagnosis of right femoral vein DVT s/p heparin treatment. GI consulted urgently for concerns over new GI bleeding that started last night with hematemesis. 2 episodes of hematochezia starting at 1100 this morning.  1. Acute post-hemorrhagic anemia 2. Hematochezia/Hematemesis 3. Advanced age 15. Chronic antiplatelet therapy - on Plavix at thome 7. Newly diagnosed right DVT - heparin d/c'd  COVID-19 TEST : NEGATIVE  -Concern for active GI bleed. Differential includes peptic ulcer disease, gastritis +/- H pylori, esophagitis, diverticular bleed, malignancy, AVMs, colon polyp, GAVE, Dieulafoy's lesion, ischemic colitis, etc -Agree with discontinuation of heparin and all anticoagulation -Patient has been NPO since 0900. Remain strict NPO -Advise urgent EGD and unprepped flexible sigmoidoscopy this afternoon with Dr. Norma Fredrickson. I discussed recommendations with patient and his partner who are in agreement to proceed. -See procedure note for findings and further recommendations -Discussed with hospitalist (Dr. Fran Lowes) who will have 1u pRBCs on standy ready if needed   I reviewed the risks (including bleeding, perforation, infection, anesthesia complications, cardiac/respiratory complications), benefits and alternatives of EGD and flex sig. Patient consents to proceed.    Thank you for the consult. Please call with questions or concerns.  Mickle Mallory [facility] [phone] (Cell)

## 2020-09-03 NOTE — Op Note (Signed)
[facility] Gastroenterology Patient Name: Matthew Greer Procedure Date: 09/03/2020 2:50 PM MRN: [id number] Account #: [id number] Date of Birth: 05-09-1926 Admit Type: Outpatient Age: 87 Room: [facility] ENDO ROOM 3 Gender: Male Note Status: Finalized Procedure:             Upper GI endoscopy Indications:           Acute post hemorrhagic anemia, Hematemesis,                         Hematochezia Providers:             Boykin Nearing. Metztli Sachdev MD, MD Medicines:             Propofol per Anesthesia Complications:         No immediate complications. Estimated blood loss:                         Minimal. Procedure:             Pre-Anesthesia Assessment:                        - The risks and benefits of the procedure and the                         sedation options and risks were discussed with the                         patient. All questions were answered and informed                         consent was obtained.                        - Patient identification and proposed procedure were                         verified prior to the procedure by the nurse. The                         procedure was verified in the procedure room.                        - ASA Grade Assessment: III - A patient with severe                         systemic disease.                        - After reviewing the risks and benefits, the patient                         was deemed in satisfactory condition to undergo the                         procedure.                        After obtaining informed consent, the endoscope was  passed under direct vision. Throughout the procedure,                         the patient's blood pressure, pulse, and oxygen                         saturations were monitored continuously. The Endoscope                         was introduced through the mouth, and advanced to the                         third part of duodenum. The upper GI endoscopy was                          somewhat difficult due to poor endoscopic                         visualization. The patient tolerated the procedure                         well. Findings:      A non-bleeding Mallory-Weiss tear with stigmata of recent bleeding was       found.      A large amount of food (residue) was found in the gastric fundus.      Hematin (altered blood/coffee-ground-like material) was found in the       gastric fundus.      A 2 cm hiatal hernia was present.      Red blood was found in the first portion of the duodenum and in the       second portion of the duodenum.      There is no endoscopic evidence of bleeding, mucosal abnormalities,       ulceration, angioectasia or erosion in the entire examined duodenum.      The exam was otherwise without abnormality. Impression:            - Mallory-Weiss tear.                        - A large amount of food (residue) in the stomach.                        - Hematin (altered blood/coffee-ground-like material)                         in the gastric fundus.                        - 2 cm hiatal hernia.                        - Blood in the second portion of the duodenum.                        - The examination was otherwise normal.                        - No specimens collected. Recommendation:        - proceed with flexible sigmoidoscopy Procedure Code(s):     ---  Professional ---                        [id number], Esophagogastroduodenoscopy, flexible,                         transoral; diagnostic, including collection of                         specimen(s) by brushing or washing, when performed                         (separate procedure) Diagnosis Code(s):     --- Professional ---                        K92.1, Melena (includes Hematochezia)                        K92.0, Hematemesis                        D62, Acute posthemorrhagic anemia                        K44.9, Diaphragmatic hernia without obstruction or                          gangrene                        K92.2, Gastrointestinal hemorrhage, unspecified                        K22.6, Gastro-esophageal laceration-hemorrhage syndrome CPT copyright 2019 [employer]. All rights reserved. The codes documented in this report are preliminary and upon coder review may  be revised to meet current compliance requirements. Stanton Kidney MD, MD 09/03/2020 3:31:17 PM This report has been signed electronically. Number of Addenda: 0 Note Initiated On: 09/03/2020 2:50 PM Estimated Blood Loss:  Estimated blood loss was minimal.      [facility]

## 2020-09-03 NOTE — Interval H&P Note (Signed)
History and Physical Interval Note:  09/03/2020 3:02 PM  Pershing Cox  has presented today for surgery, with the diagnosis of Acute post hemorrhagic anemia, hematochezia, hematemesis.  The various methods of treatment have been discussed with the patient and family. After consideration of risks, benefits and other options for treatment, the patient has consented to  Procedure(s): ESOPHAGOGASTRODUODENOSCOPY (EGD) (N/A) FLEXIBLE SIGMOIDOSCOPY (N/A) as a surgical intervention.  The patient's history has been reviewed, patient examined, no change in status, stable for surgery.  I have reviewed the patient's chart and labs.  Questions were answered to the patient's satisfaction.     [place], [place]

## 2020-09-03 NOTE — Consult Note (Signed)
GI Inpatient Consult Note  Reason for Consult: Acute post hemorrhagic anemia, hematochezia, hematemesis    Attending Requesting Consult: Dr. Tina Lai  History of Present Illness: Matthew Greer is a 87 y.o. male seen for evaluation of acute post hemorrhagic anemia, hematochezia, and hematemesis at the request of Dr. Lai. Pt has a PMH of Hx of CVA with right-sided hemiparesis on Plavix, HTN, PVD, hearing loss, and BPH. He presented to the [facility] ED last night for chief complaint of right lower extremity leg pain. He was diagnosed with right femoral vein DVT and had CTA chest negative for PE. He was started on heparin infusion.  Rapid response was called this morning around 0630 after patient was found in the bed with blood around his mouth, neck, bach, and chest. Per hospitalist, patient had large volume emesis this morning which was frank hematemesis with some clots. His heparin infusion was stopped and stat blood draws showed stable hemoglobin at 11.4. Around 1100 this morning patient had a large volume episode of hematochezia with dark red blood and clots. GI was consulted for concern over GI bleed. Upon my examination, patient was actively having another episode of hematochezia. Patient is alert and oriented and able to respond to questions. He is very hard of hearing but is able to read lips well. He denies any abdominal pain, rectal pain, nausea, vomiting. He is tolerating his secretions fine. Hemoglobin was last checked at 1100 this morning and was stable at 11. Patient's partner is also present in room and helps to answer history questions. He reports he does not know any history of GI bleeding. There is no reported history of alcohol or recreational drug use. No history of GERD. He is not on any acid suppression therapy. No excessive use of NSAIDs. He did have outpatient endoscopy performed by Dr. Wohl 02/2016 for indications of dysphagia which showed an esophageal stricture which was dilated, normal  stomach and duodenum with no specimens collected.    Past Medical History:  Past Medical History:  Diagnosis Date  . Hypertension   . Stroke (HCC) 2013   "minor" - no deficits    Problem List: Patient Active Problem List   Diagnosis Date Noted  . Right femoral vein DVT (HCC) 09/02/2020  . Right hemiplegia (HCC) 09/02/2020  . Benign prostatic hyperplasia with urinary retention 08/09/2020  . UTI (urinary tract infection) 10/01/2019  . Weakness 09/30/2019  . HTN (hypertension), malignant   . Acute cystitis without hematuria   . AKI (acute kidney injury) (HCC)   . Hearing impaired person, bilateral   . Obstructive uropathy 04/10/2019  . Acute CVA (cerebrovascular accident) (HCC) 04/10/2019  . Acute blood loss anemia 08/15/2016  . Hip hematoma, right, initial encounter 08/15/2016  . CVA (cerebral vascular accident) (HCC) 05/25/2016  . Problems with swallowing and mastication   . Stricture and stenosis of esophagus   . Benign essential hypertension 01/27/2016  . Kidney stones 01/27/2016  . Other nonspecific abnormal finding of lung field 01/27/2016  . Pure hypercholesterolemia 01/27/2016  . Stroke (HCC) 01/27/2016  . 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 Rotator cuff tendonitis 11/10/2013  . Hearing loss 04/09/2013  . Eczema 07/26/2012  . Abdominal pain 12/27/2011    Past Surgical History: Past Surgical History:  Procedure Laterality Date  . ESOPHAGEAL DILATION  02/24/2016   Procedure: ESOPHAGEAL DILATION;  Surgeon: Darren Wohl, MD;  Location: [facility];  Service: Endoscopy;;  . ESOPHAGOGASTRODUODENOSCOPY (EGD) WITH PROPOFOL N/A 02/24/2016     Procedure: ESOPHAGOGASTRODUODENOSCOPY (EGD) WITH PROPOFOL;  Surgeon: Midge Minium, MD;  Location: [facility];  Service: Endoscopy;  Laterality: N/A;  . ESOPHAGOGASTRODUODENOSCOPY ENDOSCOPY  03/2013   Dr. Servando Snare  . THORACOTOMY  1953    Allergies: Allergies  Allergen Reactions  . Cephalexin Other  (See Comments)    weakness  . Chlorhexidine   . Finasteride Nausea Only and Other (See Comments)    Reaction:  Confusion   . Levofloxacin Other (See Comments)  . Lisinopril Other (See Comments)    Reaction:  Confusion    Home Medications: Medications Prior to Admission  Medication Sig Dispense Refill Last Dose  . amLODipine (NORVASC) 5 MG tablet Take 1 tablet (5 mg total) by mouth daily. 30 tablet 2 09/01/2020 at AM  . aspirin EC 81 MG EC tablet Take 1 tablet (81 mg total) by mouth daily. 30 tablet 1 09/01/2020 at AM  . atorvastatin (LIPITOR) 10 MG tablet Take 10 mg by mouth every evening.    09/01/2020 at Unknown time  . clopidogrel (PLAVIX) 75 MG tablet Take 75 mg by mouth daily.   09/01/2020 at AM  . loratadine (CLARITIN) 10 MG tablet Take 10 mg by mouth daily.   09/01/2020 at AM  . losartan (COZAAR) 25 MG tablet Take 25 mg by mouth daily.   09/01/2020 at AM  . metoprolol (LOPRESSOR) 50 MG tablet Take 1 tablet (50 mg total) by mouth 2 (two) times daily. 60 tablet 0 09/01/2020 at PM  . STOOL SOFTENER 100 MG capsule    09/01/2020 at Unknown time  . acetaminophen (TYLENOL) 325 MG tablet Take 325-650 mg by mouth every 6 (six) hours as needed for mild pain, moderate pain, fever or headache.    PRN   Home medication reconciliation was completed with the patient.   Scheduled Inpatient Medications:   . acetaminophen  1,000 mg Oral Once  . amLODipine  5 mg Oral Daily  . atorvastatin  10 mg Oral QPM  . clopidogrel  75 mg Oral Daily  . loratadine  10 mg Oral Daily  . losartan  25 mg Oral Daily  . metoprolol tartrate  50 mg Oral BID  . multivitamin with minerals  1 tablet Oral Daily  . vitamin B-12  1,000 mcg Oral Daily    Continuous Inpatient Infusions:    PRN Inpatient Medications:  acetaminophen **OR** acetaminophen, ondansetron **OR** ondansetron (ZOFRAN) IV, traMADol  Family History: family history is not on file.  The patient's family history is negative for inflammatory bowel  disorders, GI malignancy, or solid organ transplantation.  Social History:   reports that he has quit smoking. He has never used smokeless tobacco. He reports that he does not drink alcohol and does not use drugs. The patient denies ETOH, tobacco, or drug use.   Review of Systems: Constitutional: Weight is stable.  Eyes: No changes in vision. ENT: No oral lesions, sore throat.  GI: see HPI.  Heme/Lymph: No easy bruising.  CV: No chest pain.  GU: No hematuria.  Integumentary: No rashes.  Neuro: No headaches.  Psych: No depression/anxiety.  Endocrine: No heat/cold intolerance.  Allergic/Immunologic: No urticaria.  Resp: No cough, SOB.  Musculoskeletal: No joint swelling.    Physical Examination: BP 133/70   Pulse 74   Temp 97.8 F (36.6 C) (Oral)   Resp 20   Wt 79.2 kg   SpO2 99%   BMI 25.41 kg/m   Frail, elderly appearing male in hospital bed. Partner present at bedside. Pt is hard  of hearing but able to answer questions.  Gen: NAD, alert and oriented x 4 HEENT: PEERLA, EOMI, Neck: supple, no JVD or thyromegaly Chest: CTA bilaterally, no wheezes, crackles, or other adventitious sounds CV: RRR, no m/g/c/r Abd: soft, NT, ND, +BS in all four quadrants; no HSM, guarding, ridigity, or rebound tenderness. There is active hematochezia and dark stool on bed sheets.  Ext: no edema, well perfused with 2+ pulses, Skin: no rash or lesions noted Lymph: no LAD  Data: Lab Results  Component Value Date   WBC 14.2 (H) 09/03/2020   HGB 11.0 (L) 09/03/2020   HCT 34.4 (L) 09/03/2020   MCV 91.9 09/03/2020   PLT 240 09/03/2020   Recent Labs  Lab 09/02/20 0938 09/03/20 0626 09/03/20 1114  HGB 13.4 11.4* 11.0*   Lab Results  Component Value Date   NA 138 09/03/2020   K 4.2 09/03/2020   CL 111 09/03/2020   CO2 21 (L) 09/03/2020   BUN 45 (H) 09/03/2020   CREATININE 1.22 09/03/2020   Lab Results  Component Value Date   ALT 14 09/02/2020   AST 19 09/02/2020   ALKPHOS 89  09/02/2020   BILITOT 1.0 09/02/2020   Recent Labs  Lab 09/02/20 1541  APTT 38*  INR 1.2   Assessment/Plan:  87 y/o Caucasian male with a PMH of Hx of CVA with right-sided hemiparesis on Plavix, HTN, PVD, hearing loss, and BPH admitted to the hospital last night for acute right leg pain with new diagnosis of right femoral vein DVT s/p heparin treatment. GI consulted urgently for concerns over new GI bleeding that started last night with hematemesis. 2 episodes of hematochezia starting at 1100 this morning.  1. Acute post-hemorrhagic anemia 2. Hematochezia/Hematemesis 3. Advanced age 15. Chronic antiplatelet therapy - on Plavix at thome 7. Newly diagnosed right DVT - heparin d/c'd  COVID-19 TEST : NEGATIVE  -Concern for active GI bleed. Differential includes peptic ulcer disease, gastritis +/- H pylori, esophagitis, diverticular bleed, malignancy, AVMs, colon polyp, GAVE, Dieulafoy's lesion, ischemic colitis, etc -Agree with discontinuation of heparin and all anticoagulation -Patient has been NPO since 0900. Remain strict NPO -Advise urgent EGD and unprepped flexible sigmoidoscopy this afternoon with Dr. Norma Fredrickson. I discussed recommendations with patient and his partner who are in agreement to proceed. -See procedure note for findings and further recommendations -Discussed with hospitalist (Dr. Fran Lowes) who will have 1u pRBCs on standy ready if needed   I reviewed the risks (including bleeding, perforation, infection, anesthesia complications, cardiac/respiratory complications), benefits and alternatives of EGD and flex sig. Patient consents to proceed.    Thank you for the consult. Please call with questions or concerns.  Mickle Mallory [facility] [phone] (Cell)

## 2020-09-03 NOTE — Progress Notes (Addendum)
PROGRESS NOTE    BABOUCARR SINGERMAN  KJZ:[id number] DOB: [birth year] DOA: 09/02/2020 PCP: Rolm Gala, MD   Brief Narrative:  Matthew Greer is a 87 y.o. male with medical history significant for Prior CVA with right-sided hemiparesis, Hypertension, peripheral vascular disease, presents to the emergency department for chief concerns of right leg pain.   He was diagnosed with a right femoral vein DVT and ruled out for acute PE with CTA chest.  Started on a heparin infusion A rapid response was called at around 6:35 AM after patient was found to have a large vomitus versus cough of blood with clots   History history Assessment & Plan:   Suspect hemoptysis vs hematemesis Systemic anticoagulation with heparin for femoral vein DVT -Patient tachycardic but otherwise hemodynamically stable -Stat labs -DC heparin infusion -Stat chest x-ray and EKG -IV fluid bolus -Continue close monitoring  Being signed out to incoming provider follow-up labs in for continued management due to shift change  Principal Problem:   Right femoral vein DVT (HCC) Active Problems:   Degenerative joint disease of shoulder region   Benign essential hypertension   Hearing loss   CVA (cerebral vascular accident) (HCC)   Weakness   Benign prostatic hyperplasia with urinary retention   Right hemiplegia (HCC)  CRITICAL CARE Performed by: Andris Baumann   Total critical care time: 35 minutes  Critical care time was exclusive of separately billable procedures and treating other patients.  Critical care was necessary to treat or prevent imminent or life-threatening deterioration.  Critical care was time spent personally by me on the following activities: development of treatment plan with patient and/or surrogate as well as nursing, discussions with consultants, evaluation of patient's response to treatment, examination of patient, obtaining history from patient or surrogate, ordering and performing treatments and  interventions, ordering and review of laboratory studies, ordering and review of radiographic studies, pulse oximetry and re-evaluation of patient's condition.   Objective: Vitals:   09/02/20 1330 09/02/20 1737 09/02/20 1949 09/03/20 0633  BP:  (!) 159/74 (!) 159/75 (!) 155/93  Pulse: 76 73 79 (!) 102  Resp: 17 16 20    Temp:  98.5 F (36.9 C) 98.6 F (37 C)   TempSrc:  Oral Oral   SpO2: 98% 100% 98% 97%  Weight:        Intake/Output Summary (Last 24 hours) at 09/03/2020 9480 Last data filed at 09/03/2020 0500 Gross per 24 hour  Intake 193.78 ml  Output 1000 ml  Net -806.22 ml   Filed Weights   09/02/20 0919  Weight: 79.6 kg    Examination:  General exam: Frail and ill-appearing, tachypneic but alert and oriented Respiratory system: Decreased bilaterally. Respiratory effort increased. Cardiovascular system: S1 & S2 heard, tachypneic no JVD, murmurs, rubs, gallops or clicks. No pedal edema. Gastrointestinal system: Abdomen is nondistended, soft and nontender. No organomegaly or masses felt. Normal bowel sounds heard. Central nervous system: Alert and oriented. No focal neurological deficits.    Data Reviewed: I have personally reviewed following labs and imaging studies  CBC: Recent Labs  Lab 09/02/20 0938 09/03/20 0626  WBC 10.3 14.2*  NEUTROABS 7.6  --   HGB 13.4 11.4*  HCT 40.3 35.0*  MCV 90.2 91.9  PLT 267 240   Basic Metabolic Panel: Recent Labs  Lab 09/02/20 0938  NA 140  K 3.6  CL 107  CO2 21*  GLUCOSE 106*  BUN 24*  CREATININE 1.41*  CALCIUM 9.7   GFR: Estimated Creatinine Clearance: 34  mL/min (A) (by C-G formula based on SCr of 1.41 mg/dL (H)). Liver Function Tests: Recent Labs  Lab 09/02/20 0938  AST 19  ALT 14  ALKPHOS 89  BILITOT 1.0  PROT 7.0  ALBUMIN 3.8   Recent Labs  Lab 09/02/20 0938  LIPASE 40   No results for input(s): AMMONIA in the last 168 hours. Coagulation Profile: Recent Labs  Lab 09/02/20 1541  INR 1.2    Cardiac Enzymes: Recent Labs  Lab 09/02/20 0938  CKTOTAL 39*   BNP (last 3 results) No results for input(s): PROBNP in the last 8760 hours. HbA1C: No results for input(s): HGBA1C in the last 72 hours. CBG: Recent Labs  Lab 09/03/20 0639  GLUCAP 126*   Lipid Profile: No results for input(s): CHOL, HDL, LDLCALC, TRIG, CHOLHDL, LDLDIRECT in the last 72 hours. Thyroid Function Tests: No results for input(s): TSH, T4TOTAL, FREET4, T3FREE, THYROIDAB in the last 72 hours. Anemia Panel: No results for input(s): VITAMINB12, FOLATE, FERRITIN, TIBC, IRON, RETICCTPCT in the last 72 hours. Sepsis Labs: No results for input(s): PROCALCITON, LATICACIDVEN in the last 168 hours.  Recent Results (from the past 240 hour(s))  Resp Panel by RT-PCR (Flu A&B, Covid) Nasopharyngeal Swab     Status: None   Collection Time: 09/02/20  9:38 AM   Specimen: Nasopharyngeal Swab; Nasopharyngeal(NP) swabs in vial transport medium  Result Value Ref Range Status   SARS Coronavirus 2 by RT PCR NEGATIVE NEGATIVE Final    Comment: (NOTE) SARS-CoV-2 target nucleic acids are NOT DETECTED.  The SARS-CoV-2 RNA is generally detectable in upper respiratory specimens during the acute phase of infection. The lowest concentration of SARS-CoV-2 viral copies this assay can detect is 138 copies/mL. A negative result does not preclude SARS-Cov-2 infection and should not be used as the sole basis for treatment or other patient management decisions. A negative result may occur with  improper specimen collection/handling, submission of specimen other than nasopharyngeal swab, presence of viral mutation(s) within the areas targeted by this assay, and inadequate number of viral copies(<138 copies/mL). A negative result must be combined with clinical observations, patient history, and epidemiological information. The expected result is Negative.  Fact Sheet for Patients:  BloggerCourse.com  Fact  Sheet for Healthcare Providers:  SeriousBroker.it  This test is no t yet approved or cleared by the [place] FDA and  has been authorized for detection and/or diagnosis of SARS-CoV-2 by FDA under an Emergency Use Authorization (EUA). This EUA will remain  in effect (meaning this test can be used) for the duration of the COVID-19 declaration under Section 564(b)(1) of the Act, 21 U.S.C.section 360bbb-3(b)(1), unless the authorization is terminated  or revoked sooner.       Influenza A by PCR NEGATIVE NEGATIVE Final   Influenza B by PCR NEGATIVE NEGATIVE Final    Comment: (NOTE) The Xpert Xpress SARS-CoV-2/FLU/RSV plus assay is intended as an aid in the diagnosis of influenza from Nasopharyngeal swab specimens and should not be used as a sole basis for treatment. Nasal washings and aspirates are unacceptable for Xpert Xpress SARS-CoV-2/FLU/RSV testing.  Fact Sheet for Patients: BloggerCourse.com  Fact Sheet for Healthcare Providers: SeriousBroker.it  This test is not yet approved or cleared by the [place] FDA and has been authorized for detection and/or diagnosis of SARS-CoV-2 by FDA under an Emergency Use Authorization (EUA). This EUA will remain in effect (meaning this test can be used) for the duration of the COVID-19 declaration under Section 564(b)(1) of the Act, 21 U.S.C.  section 360bbb-3(b)(1), unless the authorization is terminated or revoked.  Performed at [facility], [address], [place], [place] [address]          Radiology Studies: CT Head Wo Contrast  Result Date: 09/02/2020 CLINICAL DATA:  Headache. EXAM: CT HEAD WITHOUT CONTRAST TECHNIQUE: Contiguous axial images were obtained from the base of the skull through the vertex without intravenous contrast. COMPARISON:  None. FINDINGS: Brain: Mild diffuse cortical atrophy is noted. Mild chronic ischemic white  matter disease is noted. Old lacunar infarction is noted in left basal ganglia. No mass effect or midline shift is noted. Ventricular size is within normal limits. There is no evidence of mass lesion, hemorrhage or acute infarction. Vascular: No hyperdense vessel or unexpected calcification. Skull: Normal. Negative for fracture or focal lesion. Sinuses/Orbits: No acute finding. Other: None. IMPRESSION: Mild diffuse cortical atrophy. Mild chronic ischemic white matter disease. No acute intracranial abnormality seen. Electronically Signed   By: Lupita Raider M.D.   On: 09/02/2020 13:06   CT Angio Chest PE W and/or Wo Contrast  Result Date: 09/02/2020 CLINICAL DATA:  Chest pain radiating to the left arm EXAM: CT ANGIOGRAPHY CHEST WITH CONTRAST TECHNIQUE: Multidetector CT imaging of the chest was performed using the standard protocol during bolus administration of intravenous contrast. Multiplanar CT image reconstructions and MIPs were obtained to evaluate the vascular anatomy. CONTRAST:  39mL OMNIPAQUE IOHEXOL 350 MG/ML SOLN COMPARISON:  05/28/2017, 07/18/2011 FINDINGS: Cardiovascular: Satisfactory opacification of the pulmonary arteries to the segmental level. No evidence of pulmonary embolism. Thoracic aorta is nonaneurysmal. Atherosclerotic calcifications of the aorta and coronary arteries. Normal heart size. No pericardial effusion. Mediastinum/Nodes: Chronically prominent mediastinal and bilateral hilar lymph nodes, some of which contain punctate calcifications. Findings suggest sequela of chronic granulomatous disease. No enlarging mediastinal or hilar lymph nodes. No axillary lymphadenopathy. Thyroid unremarkable. Trachea within normal limits. Moderate-sized hiatal hernia. Lungs/Pleura: Prominent biapical pleuroparenchymal scarring. Benign 5 mm nodule at the right lung apex, unchanged. Dependent bibasilar subsegmental atelectasis. No airspace consolidation, pleural effusion, or pneumothorax. Upper Abdomen:  Cholelithiasis. Musculoskeletal: No acute osseous findings. Advanced bilateral glenohumeral joint osteoarthritis, left worse than right. Review of the MIP images confirms the above findings. IMPRESSION: 1. Negative for pulmonary embolism or other acute intrathoracic process. 2. Chronically prominent mediastinal and bilateral hilar lymph nodes, some of which contain punctate calcifications. Findings suggest sequela of chronic granulomatous disease. 3. Moderate-sized hiatal hernia. 4. Cholelithiasis. 5. Aortic and coronary artery atherosclerosis (ICD10-I70.0). Electronically Signed   By: Duanne Guess D.O.   On: 09/02/2020 13:10   US Venous Img Lower Unilateral Right  Result Date: 09/02/2020 CLINICAL DATA:  Pain EXAM: RIGHT LOWER EXTREMITY VENOUS DOPPLER ULTRASOUND TECHNIQUE: Gray-scale sonography with compression, as well as color and duplex ultrasound, were performed to evaluate the deep venous system(s) from the level of the common femoral vein through the popliteal and proximal calf veins. COMPARISON:  None. FINDINGS: VENOUS Short segment partially occlusive thrombus within the femoral vein. Otherwise, normal compressibility of the common femoral, superficial femoral, and popliteal veins, as well as the visualized calf veins. Visualized portions of profunda femoral vein and great saphenous vein unremarkable. Besides the femoral vein, no other filling defects to suggest DVT on grayscale or color Doppler imaging. Limited views of the contralateral common femoral vein are unremarkable. IMPRESSION: Short segment partially occlusive thrombus within the right femoral vein. Electronically Signed   By: Feliberto Harts MD   On: 09/02/2020 11:31   DG Chest Portable 1 View  Result  Date: 09/02/2020 CLINICAL DATA:  Chest pain EXAM: PORTABLE CHEST 1 VIEW COMPARISON:  09/30/2019 FINDINGS: Cardiomediastinal silhouette and pulmonary vasculature are within normal limits. Small hiatal hernia. Bibasilar  atelectasis/scarring again seen. IMPRESSION: No acute cardiopulmonary process. Electronically Signed   By: Acquanetta Belling M.D.   On: 09/02/2020 10:53        Scheduled Meds: . acetaminophen  1,000 mg Oral Once  . amLODipine  5 mg Oral Daily  . atorvastatin  10 mg Oral QPM  . clopidogrel  75 mg Oral Daily  . loratadine  10 mg Oral Daily  . losartan  25 mg Oral Daily  . metoprolol tartrate  50 mg Oral BID  . multivitamin with minerals  1 tablet Oral Daily  . vitamin B-12  1,000 mcg Oral Daily   Continuous Infusions: . heparin 1,000 Units/hr (09/03/20 0340)     LOS: 0 days    Time spent: 35 min    Andris Baumann, MD [facility]

## 2020-09-03 NOTE — Consult Note (Addendum)
ANTICOAGULATION CONSULT NOTE - Initial Consult  Pharmacy Consult for heparin drip Indication: DVT  Allergies  Allergen Reactions  . Cephalexin Other (See Comments)    weakness  . Chlorhexidine   . Finasteride Nausea Only and Other (See Comments)    Reaction:  Confusion   . Levofloxacin Other (See Comments)  . Lisinopril Other (See Comments)    Reaction:  Confusion    Patient Measurements: Weight: 79.6 kg (175 lb 7.8 oz) Heparin Dosing Weight: 79.6kg  Vital Signs: Temp: 98.6 F (37 C) (03/31 1949) Temp Source: Oral (03/31 1949) BP: 159/75 (03/31 1949) Pulse Rate: 79 (03/31 1949)  Labs: Recent Labs    09/02/20 0938 09/02/20 1145 09/02/20 1541 09/02/20 2312  HGB 13.4  --   --   --   HCT 40.3  --   --   --   PLT 267  --   --   --   APTT  --   --  38*  --   LABPROT  --   --  14.4  --   INR  --   --  1.2  --   HEPARINUNFRC  --   --   --  1.26*  CREATININE 1.41*  --   --   --   CKTOTAL 39*  --   --   --   TROPONINIHS 5 3  --   --     Estimated Creatinine Clearance: 34 mL/min (A) (by C-G formula based on SCr of 1.41 mg/dL (H)).   Medical History: Past Medical History:  Diagnosis Date  . Hypertension   . Stroke [facility]) 2013   "minor" - no deficits    Medications:  No PTA anticoagulation of record - of note pt on clopidogrel/asa 81mg .   Assessment: 87 yo male with pmh htn/stroke presenting with CP without elevated troponins and 99 with evidence of DVT.    Goal of Therapy:  Heparin level 0.3-0.7 units/ml Monitor platelets by anticoagulation protocol: Yes   Plan:  3/31:  HL @ 2312 = 1.26 Spoke with lab who could not confirm specimen was drawn from opposite arm as heparin infusion.  Will order recollect for 4/01 @ 0145.   04/01:  HL @ 0143 = 1.25,  Lab tech confirmed both specimens were drawn correctly.   Will hold heparin drip for 1 hr and restart @ 1000 units/hr. Will recheck HL 8 hrs after restart.    06/01, PharmD Clinical  Pharmacist 09/03/2020 1:34 AM

## 2020-09-03 NOTE — Progress Notes (Signed)
   09/03/20 1100  Clinical Encounter Type  Visited With Patient;Health care provider  Visit Type Initial;Spiritual support;Social support  Referral From Chaplain  Consult/Referral To Chaplain  Spiritual Encounters  Spiritual Needs Prayer;Emotional   Chaplain visited PT and Legal Guardian while doing his rounds. Chaplain offered spiritual and emotional support.

## 2020-09-03 NOTE — Plan of Care (Signed)

## 2020-09-03 NOTE — Progress Notes (Signed)
   09/03/20 0631  Clinical Encounter Type  Visited With Patient  Visit Type Initial  Referral From Nurse  Consult/Referral To Chaplain  Spiritual Encounters  Spiritual Needs Prayer;Emotional  Chaplain Diva Lemberger responded to a RR for room 252A, Pt Mr. Matthew Greer. Pt was having some difficulties and in pain. The medical team provided care and gave pain meds, Pt is doing better.I provided spiritual support and prayer

## 2020-09-03 NOTE — Progress Notes (Signed)
PROGRESS NOTE    Matthew Greer  TJQ:[id number] DOB: September 18, 1925 DOA: 09/02/2020 PCP: Rolm Gala, MD  252A/252A-AA   Assessment & Plan:   Principal Problem:   Right femoral vein DVT (HCC) Active Problems:   Degenerative joint disease of shoulder region   Benign essential hypertension   Hearing loss   CVA (cerebral vascular accident) (HCC)   Weakness   Benign prostatic hyperplasia with urinary retention   Right hemiplegia (HCC)   GI bleed   Matthew Greer is a 87 y.o. male with medical history significant for Prior CVA with right-sided hemiparesis, Hypertension, peripheral vascular disease, presents to the emergency department for chief concerns of right leg pain.  Found to have right LE DVT, got started on heparin gtt and then started having GI bleeds.  Upper GI bleed 2/2 Mallory-Weiss tear  --in the setting of heparin gtt and being on home plavix. --Hgb stable around 11 Plan: --EGD today found Mallory-Weiss tear  --Hold anticoagulation and home plavix --trend Hgb and transfuse to keep Hgb >7  Right femoral DVT -Suspect secondary to chronic bedbound state -CTA of the chest for PE was negative for pulmonary embolism. --started on heparin gtt on admission, then had GI bleed next day Plan: --hold anticoagulation --consider IVC filter  CAD --hold home ASA and plavix due to GI bleed  Cholelithiasis-asymptomatic  Hypertension -cont home metop, amlodipine and losartan  Hyperlipidemia -atorvastatin 10 mg daily in the evening  Debility -patient gets daily PT at home --PT   DVT prophylaxis: No chemical ppx due to GI bleed.  No SCD due to existing DVT Code Status: DNR  Family Communication: significant other updated at bedside today Level of care: Progressive Cardiac Dispo:   The patient is from: home Anticipated d/c is to: home Anticipated d/c date is: 2-3 days Patient currently is not medically ready to d/c due to: recent GI bleed, may need IVC  filter   Subjective and Interval History:  Pt had a rapid call early this morning due to vomiting up large blood clots.  Later, pt started having dark red liquidy stools.  Pt denied abdominal pain.  Hgb stable around 11.  GI took pt for EGD and sigmoidoscopy today.  Found Mallory Weiss Tear.   Objective: Vitals:   09/03/20 1601 09/03/20 1615 09/03/20 1631 09/03/20 1658  BP: 129/77 127/65  125/60  Pulse: 67 69 70 73  Resp: 16     Temp: (!) 97.4 F (36.3 C)  (!) 97.4 F (36.3 C)   TempSrc:      SpO2: 100% 100% 97% 96%  Weight:        Intake/Output Summary (Last 24 hours) at 09/03/2020 1950 Last data filed at 09/03/2020 1820 Gross per 24 hour  Intake 673.78 ml  Output 1300 ml  Net -626.22 ml   Filed Weights   09/02/20 0919 09/03/20 1348  Weight: 79.6 kg 79.2 kg    Examination:   Constitutional: NAD, AAOx3 HEENT: conjunctivae and lids normal, EOMI CV: No cyanosis.   RESP: normal respiratory effort, on RA Extremities: No effusions, edema in BLE SKIN: warm, dry Neuro: II - XII grossly intact.   Psych: Normal mood and affect.  Appropriate judgement and reason   Data Reviewed: I have personally reviewed following labs and imaging studies  CBC: Recent Labs  Lab 09/02/20 0938 09/03/20 0626 09/03/20 1114  WBC 10.3 14.2*  --   NEUTROABS 7.6  --   --   HGB 13.4 11.4* 11.0*  HCT 40.3 35.0*  34.4*  MCV 90.2 91.9  --   PLT 267 240  --    Basic Metabolic Panel: Recent Labs  Lab 09/02/20 0938 09/03/20 0626  NA 140 138  K 3.6 4.2  CL 107 111  CO2 21* 21*  GLUCOSE 106* 124*  BUN 24* 45*  CREATININE 1.41* 1.22  CALCIUM 9.7 9.2   GFR: Estimated Creatinine Clearance: 39.3 mL/min (by C-G formula based on SCr of 1.22 mg/dL). Liver Function Tests: Recent Labs  Lab 09/02/20 0938  AST 19  ALT 14  ALKPHOS 89  BILITOT 1.0  PROT 7.0  ALBUMIN 3.8   Recent Labs  Lab 09/02/20 0938  LIPASE 40   No results for input(s): AMMONIA in the last 168 hours. Coagulation  Profile: Recent Labs  Lab 09/02/20 1541  INR 1.2   Cardiac Enzymes: Recent Labs  Lab 09/02/20 0938  CKTOTAL 39*   BNP (last 3 results) No results for input(s): PROBNP in the last 8760 hours. HbA1C: No results for input(s): HGBA1C in the last 72 hours. CBG: Recent Labs  Lab 09/03/20 0639  GLUCAP 126*   Lipid Profile: No results for input(s): CHOL, HDL, LDLCALC, TRIG, CHOLHDL, LDLDIRECT in the last 72 hours. Thyroid Function Tests: No results for input(s): TSH, T4TOTAL, FREET4, T3FREE, THYROIDAB in the last 72 hours. Anemia Panel: No results for input(s): VITAMINB12, FOLATE, FERRITIN, TIBC, IRON, RETICCTPCT in the last 72 hours. Sepsis Labs: No results for input(s): PROCALCITON, LATICACIDVEN in the last 168 hours.  Recent Results (from the past 240 hour(s))  Resp Panel by RT-PCR (Flu A&B, Covid) Nasopharyngeal Swab     Status: None   Collection Time: 09/02/20  9:38 AM   Specimen: Nasopharyngeal Swab; Nasopharyngeal(NP) swabs in vial transport medium  Result Value Ref Range Status   SARS Coronavirus 2 by RT PCR NEGATIVE NEGATIVE Final    Comment: (NOTE) SARS-CoV-2 target nucleic acids are NOT DETECTED.  The SARS-CoV-2 RNA is generally detectable in upper respiratory specimens during the acute phase of infection. The lowest concentration of SARS-CoV-2 viral copies this assay can detect is 138 copies/mL. A negative result does not preclude SARS-Cov-2 infection and should not be used as the sole basis for treatment or other patient management decisions. A negative result may occur with  improper specimen collection/handling, submission of specimen other than nasopharyngeal swab, presence of viral mutation(s) within the areas targeted by this assay, and inadequate number of viral copies(<138 copies/mL). A negative result must be combined with clinical observations, patient history, and epidemiological information. The expected result is Negative.  Fact Sheet for  Patients:  BloggerCourse.com  Fact Sheet for Healthcare Providers:  SeriousBroker.it  This test is no t yet approved or cleared by the [place] FDA and  has been authorized for detection and/or diagnosis of SARS-CoV-2 by FDA under an Emergency Use Authorization (EUA). This EUA will remain  in effect (meaning this test can be used) for the duration of the COVID-19 declaration under Section 564(b)(1) of the Act, 21 U.S.C.section 360bbb-3(b)(1), unless the authorization is terminated  or revoked sooner.       Influenza A by PCR NEGATIVE NEGATIVE Final   Influenza B by PCR NEGATIVE NEGATIVE Final    Comment: (NOTE) The Xpert Xpress SARS-CoV-2/FLU/RSV plus assay is intended as an aid in the diagnosis of influenza from Nasopharyngeal swab specimens and should not be used as a sole basis for treatment. Nasal washings and aspirates are unacceptable for Xpert Xpress SARS-CoV-2/FLU/RSV testing.  Fact Sheet for Patients: BloggerCourse.com  Fact Sheet for Healthcare Providers: SeriousBroker.it  This test is not yet approved or cleared by the [place] FDA and has been authorized for detection and/or diagnosis of SARS-CoV-2 by FDA under an Emergency Use Authorization (EUA). This EUA will remain in effect (meaning this test can be used) for the duration of the COVID-19 declaration under Section 564(b)(1) of the Act, 21 U.S.C. section 360bbb-3(b)(1), unless the authorization is terminated or revoked.  Performed at [facility], [address], [place], [place] [address]       Radiology Studies: CT Head Wo Contrast  Result Date: 09/02/2020 CLINICAL DATA:  Headache. EXAM: CT HEAD WITHOUT CONTRAST TECHNIQUE: Contiguous axial images were obtained from the base of the skull through the vertex without intravenous contrast. COMPARISON:  None. FINDINGS: Brain: Mild diffuse  cortical atrophy is noted. Mild chronic ischemic white matter disease is noted. Old lacunar infarction is noted in left basal ganglia. No mass effect or midline shift is noted. Ventricular size is within normal limits. There is no evidence of mass lesion, hemorrhage or acute infarction. Vascular: No hyperdense vessel or unexpected calcification. Skull: Normal. Negative for fracture or focal lesion. Sinuses/Orbits: No acute finding. Other: None. IMPRESSION: Mild diffuse cortical atrophy. Mild chronic ischemic white matter disease. No acute intracranial abnormality seen. Electronically Signed   By: Lupita Raider M.D.   On: 09/02/2020 13:06   CT Angio Chest PE W and/or Wo Contrast  Result Date: 09/02/2020 CLINICAL DATA:  Chest pain radiating to the left arm EXAM: CT ANGIOGRAPHY CHEST WITH CONTRAST TECHNIQUE: Multidetector CT imaging of the chest was performed using the standard protocol during bolus administration of intravenous contrast. Multiplanar CT image reconstructions and MIPs were obtained to evaluate the vascular anatomy. CONTRAST:  39mL OMNIPAQUE IOHEXOL 350 MG/ML SOLN COMPARISON:  05/28/2017, 07/18/2011 FINDINGS: Cardiovascular: Satisfactory opacification of the pulmonary arteries to the segmental level. No evidence of pulmonary embolism. Thoracic aorta is nonaneurysmal. Atherosclerotic calcifications of the aorta and coronary arteries. Normal heart size. No pericardial effusion. Mediastinum/Nodes: Chronically prominent mediastinal and bilateral hilar lymph nodes, some of which contain punctate calcifications. Findings suggest sequela of chronic granulomatous disease. No enlarging mediastinal or hilar lymph nodes. No axillary lymphadenopathy. Thyroid unremarkable. Trachea within normal limits. Moderate-sized hiatal hernia. Lungs/Pleura: Prominent biapical pleuroparenchymal scarring. Benign 5 mm nodule at the right lung apex, unchanged. Dependent bibasilar subsegmental atelectasis. No airspace  consolidation, pleural effusion, or pneumothorax. Upper Abdomen: Cholelithiasis. Musculoskeletal: No acute osseous findings. Advanced bilateral glenohumeral joint osteoarthritis, left worse than right. Review of the MIP images confirms the above findings. IMPRESSION: 1. Negative for pulmonary embolism or other acute intrathoracic process. 2. Chronically prominent mediastinal and bilateral hilar lymph nodes, some of which contain punctate calcifications. Findings suggest sequela of chronic granulomatous disease. 3. Moderate-sized hiatal hernia. 4. Cholelithiasis. 5. Aortic and coronary artery atherosclerosis (ICD10-I70.0). Electronically Signed   By: Duanne Guess D.O.   On: 09/02/2020 13:10   US Venous Img Lower Unilateral Right  Result Date: 09/02/2020 CLINICAL DATA:  Pain EXAM: RIGHT LOWER EXTREMITY VENOUS DOPPLER ULTRASOUND TECHNIQUE: Gray-scale sonography with compression, as well as color and duplex ultrasound, were performed to evaluate the deep venous system(s) from the level of the common femoral vein through the popliteal and proximal calf veins. COMPARISON:  None. FINDINGS: VENOUS Short segment partially occlusive thrombus within the femoral vein. Otherwise, normal compressibility of the common femoral, superficial femoral, and popliteal veins, as well as the visualized calf veins. Visualized portions of profunda femoral vein and great saphenous vein  unremarkable. Besides the femoral vein, no other filling defects to suggest DVT on grayscale or color Doppler imaging. Limited views of the contralateral common femoral vein are unremarkable. IMPRESSION: Short segment partially occlusive thrombus within the right femoral vein. Electronically Signed   By: Feliberto Harts MD   On: 09/02/2020 11:31   DG Chest Port 1 View  Result Date: 09/03/2020 CLINICAL DATA:  Hemoptysis.  Chest pain EXAM: PORTABLE CHEST 1 VIEW COMPARISON:  Yesterday FINDINGS: Mild atelectasis/scarring at the bases. Biapical pleural  based thickening. There is no edema, consolidation, effusion, or pneumothorax. Normal heart size and stable mediastinal contours. IMPRESSION: Stable from yesterday.  No evidence of acute disease. Electronically Signed   By: Marnee Spring M.D.   On: 09/03/2020 07:18   DG Chest Portable 1 View  Result Date: 09/02/2020 CLINICAL DATA:  Chest pain EXAM: PORTABLE CHEST 1 VIEW COMPARISON:  09/30/2019 FINDINGS: Cardiomediastinal silhouette and pulmonary vasculature are within normal limits. Small hiatal hernia. Bibasilar atelectasis/scarring again seen. IMPRESSION: No acute cardiopulmonary process. Electronically Signed   By: Acquanetta Belling M.D.   On: 09/02/2020 10:53     Scheduled Meds: . acetaminophen  1,000 mg Oral Once  . amLODipine  5 mg Oral Daily  . atorvastatin  10 mg Oral QPM  . clopidogrel  75 mg Oral Daily  . loratadine  10 mg Oral Daily  . losartan  25 mg Oral Daily  . metoprolol tartrate  50 mg Oral BID  . multivitamin with minerals  1 tablet Oral Daily  . vitamin B-12  1,000 mcg Oral Daily   Continuous Infusions:   LOS: 0 days     Darlin Priestly, MD [facility] If 7PM-7AM, please contact night-coverage 09/03/2020, 7:50 PM

## 2020-09-03 NOTE — Significant Event (Signed)
Rapid Response Event Note   Reason for Call : Pt threw up or coughed up a large blood clot   Initial Focused Assessment: Pt in bed with blood around mouth and on sheets. Alert and oriented but very hard of hearing. Vital signs stable. No acute distress noted.      Interventions: Labs had already been sent, 12 lead ekg, chest xray ordered. Dr Para March to bedside agrees with above.    Plan of Care: wait for results of tests, monitor. Reevaluate    Event Summary: Pt remains in pcu.   MD Notified: duncan Call [phone] Arrival [phone] End [id number]  Arrie Eastern, RN

## 2020-09-03 NOTE — Progress Notes (Signed)
OT Cancellation Note  Patient Details Name: Matthew Greer MRN: [id number] DOB: 26-May-1926   Cancelled Treatment:    Reason Eval/Treat Not Completed: Patient not medically ready. Orders received and chart reviewed. Pt having hematemesis this AM and rapid response called. Pt not medically ready for therapy at this time. Plan to initiate services as appropriate.  Matthew Folks, OTR/L [facility] [phone]

## 2020-09-04 ENCOUNTER — Encounter
Admission: EM | Disposition: A | Discharge status: Home Health | Payer: Self-pay | Source: Home / Self Care | Attending: Hospitalist

## 2020-09-04 DIAGNOSIS — K226 Gastro-esophageal laceration-hemorrhage syndrome: Secondary | ICD-10-CM | POA: Diagnosis not present

## 2020-09-04 DIAGNOSIS — I82411 Acute embolism and thrombosis of right femoral vein: Secondary | ICD-10-CM

## 2020-09-04 DIAGNOSIS — K922 Gastrointestinal hemorrhage, unspecified: Secondary | ICD-10-CM

## 2020-09-04 HISTORY — PX: IVC FILTER INSERTION: CATH118245

## 2020-09-04 LAB — CBC
HCT: 29.6 % — ABNORMAL LOW (ref 39.0–52.0)
Hemoglobin: 9.3 g/dL — ABNORMAL LOW (ref 13.0–17.0)
MCH: 29.5 pg (ref 26.0–34.0)
MCHC: 31.4 g/dL (ref 30.0–36.0)
MCV: 94 fL (ref 80.0–100.0)
Platelets: 234 10*3/uL (ref 150–400)
RBC: 3.15 MIL/uL — ABNORMAL LOW (ref 4.22–5.81)
RDW: 14 % (ref 11.5–15.5)
WBC: 13.4 10*3/uL — ABNORMAL HIGH (ref 4.0–10.5)
nRBC: 0 % (ref 0.0–0.2)

## 2020-09-04 LAB — BASIC METABOLIC PANEL
Anion gap: 7 (ref 5–15)
BUN: 51 mg/dL — ABNORMAL HIGH (ref 8–23)
CO2: 22 mmol/L (ref 22–32)
Calcium: 8.6 mg/dL — ABNORMAL LOW (ref 8.9–10.3)
Chloride: 110 mmol/L (ref 98–111)
Creatinine, Ser: 1.25 mg/dL — ABNORMAL HIGH (ref 0.61–1.24)
GFR, Estimated: 54 mL/min — ABNORMAL LOW (ref >60)
Glucose, Bld: 112 mg/dL — ABNORMAL HIGH (ref 70–99)
Potassium: 3.6 mmol/L (ref 3.5–5.1)
Sodium: 139 mmol/L (ref 135–145)

## 2020-09-04 LAB — MAGNESIUM: Magnesium: 2.4 mg/dL (ref 1.7–2.4)

## 2020-09-04 SURGERY — IVC FILTER INSERTION
Anesthesia: Moderate Sedation

## 2020-09-04 MED ORDER — FENTANYL CITRATE (PF) 100 MCG/2ML IJ SOLN
INTRAMUSCULAR | Status: DC | PRN
  Administered 2020-09-04 (×3): 25 ug via INTRAVENOUS

## 2020-09-04 MED ORDER — FENTANYL CITRATE (PF) 100 MCG/2ML IJ SOLN
INTRAMUSCULAR | Status: AC
  Filled 2020-09-04: qty 2

## 2020-09-04 MED ORDER — MIDAZOLAM HCL 5 MG/5ML IJ SOLN
INTRAMUSCULAR | Status: AC
  Filled 2020-09-04: qty 5

## 2020-09-04 MED ORDER — IODIXANOL 320 MG/ML IV SOLN
INTRAVENOUS | Status: DC | PRN
  Administered 2020-09-04: 30 mL

## 2020-09-04 MED ORDER — MORPHINE SULFATE (PF) 4 MG/ML IV SOLN
2.0000 mg | Freq: Once | INTRAVENOUS | Status: AC
  Administered 2020-09-04: 2 mg via INTRAVENOUS
  Filled 2020-09-04: qty 1

## 2020-09-04 SURGICAL SUPPLY — 9 items
CANNULA 5F STIFF (CANNULA) ×2 IMPLANT
CATH ANGIO 5F PIGTAIL 65CM (CATHETERS) ×2 IMPLANT
COVER PROBE U/S 5X48 (MISCELLANEOUS) ×2 IMPLANT
DEVICE SAFEGUARD 24CM (GAUZE/BANDAGES/DRESSINGS) ×2 IMPLANT
FILTER VC CELECT-FEMORAL (Filter) ×2 IMPLANT
PACK ANGIOGRAPHY (CUSTOM PROCEDURE TRAY) ×2 IMPLANT
SYR MEDRAD MARK 7 150ML (SYRINGE) ×2 IMPLANT
TUBING CONTRAST HIGH PRESS 72 (TUBING) ×2 IMPLANT
WIRE GUIDERIGHT .035X150 (WIRE) ×2 IMPLANT

## 2020-09-04 NOTE — Op Note (Signed)
    Patient name: Matthew Greer MRN: [id number] DOB: 1925/10/23 Sex: male  09/04/2020 Pre-operative Diagnosis: DVT Post-operative diagnosis:  Same Surgeon:  Durene Cal Procedure Performed:  1.  U/S guided access left common femoral vein  2.  IVC venogram  3.  Placement of IVC filter     Indications:  This is a 87 year old with right femoral vein DVT who was started on anti-coagulation but developed a GI bleed and so IVC filter was recommended.  Procedure:  The patient was identified in the Cath lab.  The patient was then placed supine on the table and prepped and draped in the usual sterile fashion.  A time out was called.  Ultrasound was used to evaluate the left common femoral vein.  It was patent and compressible .  A digital ultrasound image was acquired.  A micropuncture needle was used to access the left common femoral vein under ultrasound guidance.  An 018 wire was advanced without resistance and a micropuncture sheath was placed.  The 018 wire was removed and a benson wire was placed. A pig tail catheter was placed into the vena cava  Findings:   Vena Cava:  Patent without anatomic variations.  Normal diameter  Intervention:  After the images were acquired, a dilator was used to dilate the subcutaneous tissue and the filter introducing sheath was inserted.  The [employer] was then inserted and deployed at the mid L-3 vertebral body.  The sheath was removed and manual pressure was held for hemostasis.  There were no complications.   Impression:  #1  Successful placement of [employer] IVC filter at the Mid L-3 vertebral body.      Juleen China, M.D., [facility] [place] Office: [phone] Pager:  [phone]

## 2020-09-04 NOTE — Progress Notes (Signed)
Patient clinically stable post IVC filter placement per DR Brabham, tolerated well. Vitals stable pre and post procedure. Patient guardian at bedside post procedure with update given. Post procedure/recovery, report given to Arielle RN on 2A at bedside with groin noted without bleeding nor hematoma,

## 2020-09-04 NOTE — Progress Notes (Signed)
PROGRESS NOTE    MOLLY MASELLI  ZOX:[id number] DOB: 06/11/1925 DOA: 09/02/2020 PCP: Rolm Gala, MD  ARCL/NONE   Assessment & Plan:   Principal Problem:   Right femoral vein DVT (HCC) Active Problems:   Degenerative joint disease of shoulder region   Benign essential hypertension   Hearing loss   CVA (cerebral vascular accident) (HCC)   Weakness   Benign prostatic hyperplasia with urinary retention   Right hemiplegia (HCC)   GI bleed   NICOLO TOMKO is a 87 y.o. male with medical history significant for Prior CVA with right-sided hemiparesis, Hypertension, peripheral vascular disease, presents to the emergency department for chief concerns of right leg pain.  Found to have right LE DVT, got started on heparin gtt and then started having GI bleeds.  Upper GI bleed 2/2 Mallory-Weiss tear  --in the setting of heparin gtt and being on home plavix. --EGD found Mallory-Weiss tear  Plan: --hold anticoagulation and home plavix for now --trend Hgb and transfuse to keep Hgb >7  Right femoral DVT -Suspect secondary to chronic bedbound state -CTA of the chest for PE was negative for pulmonary embolism. --started on heparin gtt on admission, then had GI bleed next day Plan: --hold anticoagulation --IVC filter with vascular surgery today  CAD --hold home ASA and plavix due to GI bleed  Cholelithiasis-asymptomatic  Hypertension -cont home metop, amlodipine and losartan  Hyperlipidemia -atorvastatin 10 mg daily in the evening  Debility -patient gets daily PT at home --hold off PT until pt receives IVC filter   DVT prophylaxis: SCD Code Status: DNR  Family Communication: significant other updated at bedside today Level of care: Progressive Cardiac  Can transfer out of PCU. Dispo:   The patient is from: home Anticipated d/c is to: home Anticipated d/c date is: tomorrow Patient currently is not medically ready to d/c due to: may be discharged tomorrow if Hgb  stable and tolerating diet   Subjective and Interval History:  Pt reported feeling better today.  No more GI bleeds seen.    Received IVC filter with vascular surgery today.   Objective: Vitals:   09/04/20 1124 09/04/20 1429 09/04/20 1435 09/04/20 1440  BP: (!) 150/78 (!) 156/69 (!) 155/68 (!) 145/68  Pulse: 65 64 69 71  Resp:  (!) 22 (!) 22 (!) 22  Temp:      TempSrc:      SpO2: 99% 95% 99% 98%  Weight:        Intake/Output Summary (Last 24 hours) at 09/04/2020 1453 Last data filed at 09/04/2020 0900 Gross per 24 hour  Intake 720 ml  Output 300 ml  Net 420 ml   Filed Weights   09/02/20 0919 09/03/20 1348  Weight: 79.6 kg 79.2 kg    Examination:   Constitutional: NAD, AAOx3, frail, slow to response HEENT: conjunctivae and lids normal, EOMI CV: No cyanosis.   RESP: normal respiratory effort, on RA Extremities: No effusions, edema in BLE SKIN: warm, dry Neuro: II - XII grossly intact.   Psych: Normal mood and affect.  Appropriate judgement and reason   Data Reviewed: I have personally reviewed following labs and imaging studies  CBC: Recent Labs  Lab 09/02/20 0938 09/03/20 0626 09/03/20 1114 09/04/20 0400  WBC 10.3 14.2*  --  13.4*  NEUTROABS 7.6  --   --   --   HGB 13.4 11.4* 11.0* 9.3*  HCT 40.3 35.0* 34.4* 29.6*  MCV 90.2 91.9  --  94.0  PLT 267 240  --  234   Basic Metabolic Panel: Recent Labs  Lab 09/02/20 0938 09/03/20 0626 09/04/20 0400  NA 140 138 139  K 3.6 4.2 3.6  CL 107 111 110  CO2 21* 21* 22  GLUCOSE 106* 124* 112*  BUN 24* 45* 51*  CREATININE 1.41* 1.22 1.25*  CALCIUM 9.7 9.2 8.6*  MG  --   --  2.4   GFR: Estimated Creatinine Clearance: 38.3 mL/min (A) (by C-G formula based on SCr of 1.25 mg/dL (H)). Liver Function Tests: Recent Labs  Lab 09/02/20 0938  AST 19  ALT 14  ALKPHOS 89  BILITOT 1.0  PROT 7.0  ALBUMIN 3.8   Recent Labs  Lab 09/02/20 0938  LIPASE 40   No results for input(s): AMMONIA in the last 168  hours. Coagulation Profile: Recent Labs  Lab 09/02/20 1541  INR 1.2   Cardiac Enzymes: Recent Labs  Lab 09/02/20 0938  CKTOTAL 39*   BNP (last 3 results) No results for input(s): PROBNP in the last 8760 hours. HbA1C: No results for input(s): HGBA1C in the last 72 hours. CBG: Recent Labs  Lab 09/03/20 0639  GLUCAP 126*   Lipid Profile: No results for input(s): CHOL, HDL, LDLCALC, TRIG, CHOLHDL, LDLDIRECT in the last 72 hours. Thyroid Function Tests: No results for input(s): TSH, T4TOTAL, FREET4, T3FREE, THYROIDAB in the last 72 hours. Anemia Panel: No results for input(s): VITAMINB12, FOLATE, FERRITIN, TIBC, IRON, RETICCTPCT in the last 72 hours. Sepsis Labs: No results for input(s): PROCALCITON, LATICACIDVEN in the last 168 hours.  Recent Results (from the past 240 hour(s))  Resp Panel by RT-PCR (Flu A&B, Covid) Nasopharyngeal Swab     Status: None   Collection Time: 09/02/20  9:38 AM   Specimen: Nasopharyngeal Swab; Nasopharyngeal(NP) swabs in vial transport medium  Result Value Ref Range Status   SARS Coronavirus 2 by RT PCR NEGATIVE NEGATIVE Final    Comment: (NOTE) SARS-CoV-2 target nucleic acids are NOT DETECTED.  The SARS-CoV-2 RNA is generally detectable in upper respiratory specimens during the acute phase of infection. The lowest concentration of SARS-CoV-2 viral copies this assay can detect is 138 copies/mL. A negative result does not preclude SARS-Cov-2 infection and should not be used as the sole basis for treatment or other patient management decisions. A negative result may occur with  improper specimen collection/handling, submission of specimen other than nasopharyngeal swab, presence of viral mutation(s) within the areas targeted by this assay, and inadequate number of viral copies(<138 copies/mL). A negative result must be combined with clinical observations, patient history, and epidemiological information. The expected result is  Negative.  Fact Sheet for Patients:  BloggerCourse.com  Fact Sheet for Healthcare Providers:  SeriousBroker.it  This test is no t yet approved or cleared by the [place] FDA and  has been authorized for detection and/or diagnosis of SARS-CoV-2 by FDA under an Emergency Use Authorization (EUA). This EUA will remain  in effect (meaning this test can be used) for the duration of the COVID-19 declaration under Section 564(b)(1) of the Act, 21 U.S.C.section 360bbb-3(b)(1), unless the authorization is terminated  or revoked sooner.       Influenza A by PCR NEGATIVE NEGATIVE Final   Influenza B by PCR NEGATIVE NEGATIVE Final    Comment: (NOTE) The Xpert Xpress SARS-CoV-2/FLU/RSV plus assay is intended as an aid in the diagnosis of influenza from Nasopharyngeal swab specimens and should not be used as a sole basis for treatment. Nasal washings and aspirates are unacceptable for Xpert Xpress SARS-CoV-2/FLU/RSV testing.  Fact Sheet for Patients: BloggerCourse.com  Fact Sheet for Healthcare Providers: SeriousBroker.it  This test is not yet approved or cleared by the [place] FDA and has been authorized for detection and/or diagnosis of SARS-CoV-2 by FDA under an Emergency Use Authorization (EUA). This EUA will remain in effect (meaning this test can be used) for the duration of the COVID-19 declaration under Section 564(b)(1) of the Act, 21 U.S.C. section 360bbb-3(b)(1), unless the authorization is terminated or revoked.  Performed at [facility], [address] [place], [place] [address]       Radiology Studies: [facility] Chest [place] 1 View  Result Date: 09/03/2020 CLINICAL DATA:  Hemoptysis.  Chest pain EXAM: PORTABLE CHEST 1 VIEW COMPARISON:  Yesterday FINDINGS: Mild atelectasis/scarring at the bases. Biapical pleural based thickening. There is no edema,  consolidation, effusion, or pneumothorax. Normal heart size and stable mediastinal contours. IMPRESSION: Stable from yesterday.  No evidence of acute disease. Electronically Signed   By: Marnee Spring M.D.   On: 09/03/2020 07:18     Scheduled Meds: . [MAR Hold] acetaminophen  1,000 mg Oral Once  . [MAR Hold] amLODipine  5 mg Oral Daily  . [MAR Hold] atorvastatin  10 mg Oral QPM  . fentaNYL      . [MAR Hold] loratadine  10 mg Oral Daily  . [MAR Hold] losartan  25 mg Oral Daily  . [MAR Hold] metoprolol tartrate  50 mg Oral BID  . midazolam      . [MAR Hold] multivitamin with minerals  1 tablet Oral Daily  . [MAR Hold] vitamin B-12  1,000 mcg Oral Daily   Continuous Infusions:   LOS: 1 day     Darlin Priestly, MD [facility] If 7PM-7AM, please contact night-coverage 09/04/2020, 2:53 PM

## 2020-09-04 NOTE — Progress Notes (Signed)
GI Inpatient Follow-up Note  Subjective:  Patient seen in follow-up for UGI bleed 2/2 Mallory-Weiss tear in the setting of heparin gtt and being on home Plavix. He is s/p EGD and unprepped flexible sigmoidoscopy yesterday afternoon with Dr. Norma Fredrickson. No acute overnight events. Per nursing report, patient had one BM this morning which was brown in color. Hemoglobin this morning 9.3. Patient denies any nausea, vomiting, or abdominal pain.   Scheduled Inpatient Medications:  . acetaminophen  1,000 mg Oral Once  . amLODipine  5 mg Oral Daily  . atorvastatin  10 mg Oral QPM  . loratadine  10 mg Oral Daily  . losartan  25 mg Oral Daily  . metoprolol tartrate  50 mg Oral BID  . multivitamin with minerals  1 tablet Oral Daily  . vitamin B-12  1,000 mcg Oral Daily    Continuous Inpatient Infusions:    PRN Inpatient Medications:  acetaminophen **OR** acetaminophen, ondansetron **OR** ondansetron (ZOFRAN) IV, traMADol  Review of Systems: Constitutional: Weight is stable.  Eyes: No changes in vision. ENT: No oral lesions, sore throat.  GI: see HPI.  Heme/Lymph: No easy bruising.  CV: No chest pain.  GU: No hematuria.  Integumentary: No rashes.  Neuro: No headaches.  Psych: No depression/anxiety.  Endocrine: No heat/cold intolerance.  Allergic/Immunologic: No urticaria.  Resp: No cough, SOB.  Musculoskeletal: No joint swelling.    Physical Examination: BP (!) 141/72 (BP Location: Left Arm)   Pulse 77   Temp 98.7 F (37.1 C)   Resp 17   Wt 79.2 kg   SpO2 100%   BMI 25.41 kg/m   Frail, elderly appearing male in hospital bed. Hard of hearing but able to answer questions. Partner bedside.  Gen: NAD, alert and oriented x 4 HEENT: PEERLA, EOMI, Neck: supple, no JVD or thyromegaly Chest: CTA bilaterally, no wheezes, crackles, or other adventitious sounds CV: RRR, no m/g/c/r Abd: soft, NT, ND, +BS in all four quadrants; no HSM, guarding, ridigity, or rebound tenderness Ext: no edema,  well perfused with 2+ pulses, Skin: no rash or lesions noted Lymph: no LAD  Data: Lab Results  Component Value Date   WBC 13.4 (H) 09/04/2020   HGB 9.3 (L) 09/04/2020   HCT 29.6 (L) 09/04/2020   MCV 94.0 09/04/2020   PLT 234 09/04/2020   Recent Labs  Lab 09/03/20 0626 09/03/20 1114 09/04/20 0400  HGB 11.4* 11.0* 9.3*   Lab Results  Component Value Date   NA 139 09/04/2020   K 3.6 09/04/2020   CL 110 09/04/2020   CO2 22 09/04/2020   BUN 51 (H) 09/04/2020   CREATININE 1.25 (H) 09/04/2020   Lab Results  Component Value Date   ALT 14 09/02/2020   AST 19 09/02/2020   ALKPHOS 89 09/02/2020   BILITOT 1.0 09/02/2020   Recent Labs  Lab 09/02/20 1541  APTT 38*  INR 1.2   Assessment/Plan:  87 y/o Caucasian male with a PMH of Hx of CVA with right-sided hemiparesis on Plavix, HTN, PVD, hearing loss, and BPH admitted to the hospital last night for acute right leg pain with new diagnosis of right femoral vein DVT s/p heparin treatment. GI consulted urgently yesterday for concerns of acute GI bleeding. He is s/p EGD and unprepped flexible sigmoidoscopy showing Mallory-Weiss tear.    1. Upper GI Bleed 2/2 Mallory-Weiss tear -EGD yesterday with non-bleeding Mallory-Weiss tear with stigmata of recent bleeding, large amount of food residue in stomach, hematin, 2 cm hiatal hernia, blood in second  portion of duodenum -Hemoglobin decrease to 9.3 this morning -No further evidence of overt GI bleeding  2. Newly diagnosed right DVT - Heparin on hold  3. Chronic antiplatelet therapy - home Plavix on hold  Recommendations:  1. Continue to monitor H&H closely and transfuse for hemoglobin <7.0. 2. Continue serial abdominal examinations 3. No evidence of recurrent GI bleeding 4. No indication for repeat endoscopy at present time 5. Please call Dr. Norma Fredrickson if there is evidence of recurrent bleeding 6. Consider IVC filter placement in setting of bleed and newly diagnosed right femoral  DVT   Please call with questions or concerns.    Jacob Moores, PA-C [facility] [phone] (Cell)

## 2020-09-04 NOTE — Evaluation (Signed)
Occupational Therapy Evaluation Patient Details Name: Matthew Greer MRN: [id number] DOB: 06-20-1925 Today's Date: 09/04/2020    History of Present Illness Pt is a 87 y/o M With PMH: HTN, CVA in 2020 with residual R side weakness, HOH, BPH, and PVD. Pt presented to ED with chest pain and R LE "feeling weird". Found to have DVT on R LE. In addition, pt's hospitalization complicated d/t UGI bleed 2/2 Mallory-Weiss tear in the setting of heparin gtt and being on home Plavix. He is s/p EGD and unprepped flexible sigmoidoscopy yesterday afternoon with Dr. Norma Fredrickson. Pt with ACs held at this time and RN okay's limited OT evaluation.   Clinical Impression   Pt seen for OT evaluation this date in setting of acute hospitalization d/t R LE DVT And now with UGIB. Pt with Hgb stable this date and RN okay'ed light OT evaluation. Pt's partner present in room throughout and reports that at baseline, pt has [facility] aide that helps with bathing, dressing and commde transfers 5x/wk and that his partner assists with transfers the other two days. Pt was able to walk short bouts with PCA with RW, but reports he has not walked much in 2-3 months. Pt presents this date with edema, discomfort, weakness, and general deconditioning impacting his ability to perform ADLs/ADL mobility. Pt currently requiring MIN A for bed mobility including sup<>sit. Transfer trails and fxl mobility deferred for pt safety given dx and current stage of w/u (potential need for IVC filter). Pt requires SETUP to MIN A For UB ADLs (MIN/MOD A for bathing/dressing tasks on R side) and requires MAX A For LB at this time. While pt was already requiring some ADL assist at baseline, both pt and his partner endorse that he is weaker and requiring more assistance with ADLs/ADL mobility at this time. Will continue to follow acutely and anticipate that pt could benefit from [facility] f/u in the natural environment. Of note: pt and partner report that pt never received splinting  guidance for R UE That has progressively become quite contracted, pt could also benefit from OT f/u for splinting recommendations to R UE should pt be able to tolerate.     Follow Up Recommendations  Home health OT    Equipment Recommendations  None recommended by OT (pt has all necessary equipment)    Recommendations for Other Services       Precautions / Restrictions Precautions Precautions: Fall Restrictions Weight Bearing Restrictions: No      Mobility Bed Mobility Overal bed mobility: Needs Assistance Bed Mobility: Rolling;Supine to Sit;Sit to Supine Rolling: Min assist   Supine to sit: Min assist Sit to supine: Min assist   General bed mobility comments: increased time, use of rails    Transfers                 General transfer comment: deferred for safety given DVT    Balance Overall balance assessment: Needs assistance Sitting-balance support: Single extremity supported Sitting balance-Leahy Scale: Fair         Standing balance comment: deferred                           ADL either performed or assessed with clinical judgement   ADL Overall ADL's : Needs assistance/impaired  General ADL Comments: SETUP for single-handed UB grooming tasks, MIN/MOD A for UB bathing/dressing to assist with R UE as pt with decreased shld ROM. Pt requires MAX A with LB bathing and peri care bed level using lateral rolling technique (CGA to roll to his right and MOD A to roll to L 2/2 decreased fxn of R UE at baseline). MIN A For sup<>sit.     Vision Patient Visual Report: No change from baseline       Perception     Praxis      Pertinent Vitals/Pain Pain Assessment: Faces Faces Pain Scale: Hurts a little bit Pain Location: R LE Pain Descriptors / Indicators: Tender Pain Intervention(s): Limited activity within patient's tolerance;Monitored during session     Hand Dominance Right    Extremity/Trunk Assessment Upper Extremity Assessment Upper Extremity Assessment: RUE deficits/detail;LUE deficits/detail RUE Deficits / Details: contracted in all joints with shoulder appearing to be the tighest. He is able, with extended time and use of L UE, to extend the digits partially (~1/2 range). LUE Deficits / Details: ROM WFL, MMT grossly 4/5   Lower Extremity Assessment Lower Extremity Assessment: Defer to PT evaluation;Generalized weakness (R slightly weaker than L, difficult to assess whether r/t DVT versus h/o CVA. MMT in LEs not formally tested for safety given pt's clot.)       Communication Communication Communication: HOH;Other (comment) (relies primarily on lip reading)   Cognition Arousal/Alertness: Awake/alert Behavior During Therapy: WFL for tasks assessed/performed Overall Cognitive Status: Difficult to assess                                 General Comments: Pt is appropriate conversationally and oriented to person, place and situation but not time. Some delayed responses, primarily appears to be r/t hearing loss.   General Comments       Exercises Other Exercises Other Exercises: OT facilitates ed re: role of OT in acute setting, safety considerations given diagnosis, importance of preserving skin integrity, importance of encouraging pt do as much as he can for himself in terms of ADLs to prevent atrophy.   Shoulder Instructions      Home Living Family/patient expects to be discharged to:: Private residence Living Arrangements: Spouse/significant other Available Help at Discharge: Personal care attendant (5 days a week, M-F to perform bathing/dressing/toileting) Type of Home: House Home Access: Ramped entrance     Home Layout: One level     Bathroom Shower/Tub: Producer, television/film/video: Handicapped height Bathroom Accessibility: Yes   Home Equipment: Environmental consultant - 2 wheels;Cane - single point;Wheelchair - manual;Shower seat           Prior Functioning/Environment Level of Independence: Needs assistance  Gait / Transfers Assistance Needed: Pt prmiarily using w/c for at least 3-4 months now per pt and his SO. States he is able to propel the w/c himself mostly, but fatigues easily as he is only able to propel with L UE (occasional BLE propulsion as well). Pt is Amb for limited distances with RW with his [facility] aide. SBA for bed to w/c t/f and w/c to commode. ADL's / Homemaking Assistance Needed: Pt has PCA 5 days/wk that helps with bathing, dressing and toileting.   Comments: Pt was INDEP prior to stroke in 2020        OT Problem List: Decreased strength;Decreased range of motion;Decreased activity tolerance;Decreased coordination;Cardiopulmonary status limiting activity;Impaired UE functional use;Increased edema  OT Treatment/Interventions: Self-care/ADL training;DME and/or AE instruction;Therapeutic activities;Balance training;Therapeutic exercise;Patient/family education;Neuromuscular education    OT Goals(Current goals can be found in the care plan section) Acute Rehab OT Goals Patient Stated Goal: to get stronger and get therapy at home once I'm better OT Goal Formulation: With patient/family Time For Goal Achievement: 09/18/20 Potential to Achieve Goals: Good ADL Goals Pt Will Perform Grooming: with supervision;with set-up;sitting (for 2-3 g/h tasks to increase seated fxl activity tolerance) Pt Will Perform Upper Body Dressing: with min assist;sitting (with hemi dressing technique) Pt Will Transfer to Toilet: with min guard assist;stand pivot transfer;grab bars Pt Will Perform Tub/Shower Transfer: Shower transfer;with min guard assist;Stand pivot transfer;shower seat Pt/caregiver will Perform Home Exercise Program: Increased strength;Left upper extremity;Increased ROM;Right Upper extremity;With minimal assist  OT Frequency: Min 1X/week   Barriers to D/C:            Co-evaluation               AM-PAC OT "6 Clicks" Daily Activity     Outcome Measure Help from another person eating meals?: A Little Help from another person taking care of personal grooming?: A Little Help from another person toileting, which includes using toliet, bedpan, or urinal?: A Lot Help from another person bathing (including washing, rinsing, drying)?: A Lot Help from another person to put on and taking off regular upper body clothing?: A Little Help from another person to put on and taking off regular lower body clothing?: A Lot 6 Click Score: 15   End of Session Nurse Communication: Other (comment) (notified of session contents and pt tolerance.)  Activity Tolerance: Patient tolerated treatment well Patient left: in bed;with call bell/phone within reach  OT Visit Diagnosis: Unsteadiness on feet (R26.81);Muscle weakness (generalized) (M62.81);Other symptoms and signs involving the nervous system (R29.898);Hemiplegia and hemiparesis Hemiplegia - Right/Left: Right Hemiplegia - caused by: Cerebral infarction                Time: 3557-3220 OT Time Calculation (min): 61 min Charges:  OT General Charges $OT Visit: 1 Visit OT Evaluation $OT Eval Moderate Complexity: 1 Mod OT Treatments $Self Care/Home Management : 23-37 mins $Therapeutic Activity: 23-37 mins  Rejeana Brock, MS, OTR/L ascom [phone] 09/04/20, 12:49 PM

## 2020-09-04 NOTE — H&P (Signed)
Patient name: Matthew Greer MRN: [id number] DOB: [birth year] Sex: male   HISTORY OF PRESENT ILLNESS:   PERETZ THIEME is a 87 y.o. male with right femoral vein DVT and GI bleed, so he can not be anti coagulated.  IVC filter has been requested.  CURRENT MEDICATIONS:    Current Facility-Administered Medications  Medication Dose Route Frequency Provider Last Rate Last Admin  . acetaminophen (TYLENOL) tablet 650 mg  650 mg Oral Q6H PRN Cox, Amy N, DO   650 mg at 09/02/20 1743   Or  . acetaminophen (TYLENOL) suppository 650 mg  650 mg Rectal Q6H PRN Cox, Amy N, DO      . acetaminophen (TYLENOL) tablet 1,000 mg  1,000 mg Oral Once Cox, Amy N, DO      . amLODipine (NORVASC) tablet 5 mg  5 mg Oral Daily Cox, Amy N, DO   5 mg at 09/04/20 0935  . atorvastatin (LIPITOR) tablet 10 mg  10 mg Oral QPM Cox, Amy N, DO   10 mg at 09/03/20 1709  . fentaNYL (SUBLIMAZE) 100 MCG/2ML injection           . fentaNYL (SUBLIMAZE) injection    PRN Nada Libman, MD   25 mcg at 09/04/20 1431  . loratadine (CLARITIN) tablet 10 mg  10 mg Oral Daily Cox, Amy N, DO   10 mg at 09/04/20 0935  . losartan (COZAAR) tablet 25 mg  25 mg Oral Daily Cox, Amy N, DO   25 mg at 09/04/20 0935  . metoprolol tartrate (LOPRESSOR) tablet 50 mg  50 mg Oral BID Cox, Amy N, DO   50 mg at 09/04/20 0935  . midazolam (VERSED) 5 MG/5ML injection           . multivitamin with minerals tablet 1 tablet  1 tablet Oral Daily Cox, Amy N, DO   1 tablet at 09/04/20 0935  . ondansetron (ZOFRAN) tablet 4 mg  4 mg Oral Q6H PRN Cox, Amy N, DO       Or  . ondansetron (ZOFRAN) injection 4 mg  4 mg Intravenous Q6H PRN Cox, Amy N, DO      . traMADol (ULTRAM) tablet 50 mg  50 mg Oral Q6H PRN Andris Baumann, MD   50 mg at 09/04/20 0514  . vitamin B-12 (CYANOCOBALAMIN) tablet 1,000 mcg  1,000 mcg Oral Daily Cox, Amy N, DO   1,000 mcg at 09/04/20 0935    REVIEW OF SYSTEMS:   [X]  denotes positive finding, [ ]  denotes  negative finding Cardiac  Comments:  Chest pain or chest pressure:    Shortness of breath upon exertion:    Short of breath when lying flat:    Irregular heart rhythm:    Constitutional    Fever or chills:      PHYSICAL EXAM:   Vitals:   09/04/20 0450 09/04/20 0729 09/04/20 1124 09/04/20 1429  BP: 138/71 (!) 141/72 (!) 150/78 (!) 156/69  Pulse: 80 77 65 64  Resp: 18 17  (!) 22  Temp: 98.2 F (36.8 C) 98.7 F (37.1 C)    TempSrc:      SpO2: 99% 100% 99% 95%  Weight:        GENERAL: The patient is a well-nourished male, in no acute distress. The vital signs are documented above. CARDIOVASCULAR: There is a regular rate and rhythm. PULMONARY: Non-labored respirations   STUDIES:      MEDICAL ISSUES:   I discussed with the  patient and his guardian that I would place a IVC filter for PE prevention.  All questions answered.  Charlena Cross, MD, FACS [facility] [phone] Pager [phone]

## 2020-09-04 NOTE — Progress Notes (Signed)
PT Cancellation Note  Patient Details Name: Matthew Greer MRN: [id number] DOB: Jun 26, 1925   Cancelled Treatment:    Reason Eval/Treat Not Completed: Medical issues which prohibited therapy: Per chart review pt with upper GI bleed secondary to Mallory-Weiss tear.  Anticoagulation for R femoral DVT held and pt now pending possible IVC filter placement.  Per conversation with MD PT orders to be completed at this time and will attempt to see pt at a future date once medically appropriate and new PT orders received.     Ovidio Hanger PT, DPT 09/04/20, 11:33 AM

## 2020-09-05 DIAGNOSIS — I82411 Acute embolism and thrombosis of right femoral vein: Secondary | ICD-10-CM | POA: Diagnosis not present

## 2020-09-05 LAB — CBC
HCT: 26.3 % — ABNORMAL LOW (ref 39.0–52.0)
Hemoglobin: 8.5 g/dL — ABNORMAL LOW (ref 13.0–17.0)
MCH: 30.4 pg (ref 26.0–34.0)
MCHC: 32.3 g/dL (ref 30.0–36.0)
MCV: 93.9 fL (ref 80.0–100.0)
Platelets: 205 10*3/uL (ref 150–400)
RBC: 2.8 MIL/uL — ABNORMAL LOW (ref 4.22–5.81)
RDW: 13.9 % (ref 11.5–15.5)
WBC: 8.4 10*3/uL (ref 4.0–10.5)
nRBC: 0 % (ref 0.0–0.2)

## 2020-09-05 LAB — BASIC METABOLIC PANEL
Anion gap: 6 (ref 5–15)
BUN: 40 mg/dL — ABNORMAL HIGH (ref 8–23)
CO2: 22 mmol/L (ref 22–32)
Calcium: 8.4 mg/dL — ABNORMAL LOW (ref 8.9–10.3)
Chloride: 112 mmol/L — ABNORMAL HIGH (ref 98–111)
Creatinine, Ser: 1.3 mg/dL — ABNORMAL HIGH (ref 0.61–1.24)
GFR, Estimated: 52 mL/min — ABNORMAL LOW (ref >60)
Glucose, Bld: 96 mg/dL (ref 70–99)
Potassium: 3.6 mmol/L (ref 3.5–5.1)
Sodium: 140 mmol/L (ref 135–145)

## 2020-09-05 LAB — MAGNESIUM: Magnesium: 2.3 mg/dL (ref 1.7–2.4)

## 2020-09-05 NOTE — Progress Notes (Addendum)
PROGRESS NOTE    Matthew Greer  JJH:[id number] DOB: Aug 18, 1925 DOA: 09/02/2020 PCP: Rolm Gala, MD  [phone]   Assessment & Plan:   Principal Problem:   Right femoral vein DVT (HCC) Active Problems:   Degenerative joint disease of shoulder region   Benign essential hypertension   Hearing loss   CVA (cerebral vascular accident) (HCC)   Weakness   Benign prostatic hyperplasia with urinary retention   Right hemiplegia (HCC)   GI bleed   Matthew Greer is a 87 y.o. male with medical history significant for Prior CVA with right-sided hemiparesis, Hypertension, peripheral vascular disease, presents to the emergency department for chief concerns of right leg pain.  Found to have right LE DVT, got started on heparin gtt and then started having GI bleeds.  Upper GI bleed 2/2 Mallory-Weiss tear  --in the setting of heparin gtt and being on home plavix. --EGD found Mallory-Weiss tear  Plan: --hold anticoagulation and home plavix for now --trend Hgb and transfuse to keep Hgb >7 --outpatient followup with GI  Right femoral DVT -Suspect secondary to chronic bedbound state -CTA of the chest for PE was negative for pulmonary embolism. --started on heparin gtt on admission, then had GI bleed next day --s/p IVC filter with vascular surgery  Plan: --Hold anticoagulation  CAD --hold home ASA and Plavix due to GI bleed  Cholelithiasis-asymptomatic  Hypertension --on home metop, amlodipine and losartan --cont home BP regimen  Hyperlipidemia -atorvastatin 10 mg daily in the evening  Debility -patient gets daily PT at home  Paroxymal Afib with RVR --occurred in the setting of GI bleed, acute, resolved spontaneously without intervention.   DVT prophylaxis: SCD Code Status: DNR  Family Communication: significant other updated at bedside today Level of care: Med-Surg   Dispo:   The patient is from: home Anticipated d/c is to: home Anticipated d/c date is:  tomorrow Patient currently is not medically ready to d/c due to: may be discharged tomorrow if Hgb improved   Subjective and Interval History:  Received IVC filter yesterday, had pain afterwards, but resolved now.  Tolerated Dys 2 diet well.  No more GI bleeding.     Objective: Vitals:   09/04/20 2306 09/05/20 0411 09/05/20 0804 09/05/20 1154  BP: (!) 117/59 132/63 (!) 144/73 (!) 101/56  Pulse: 65 75 77 66  Resp: 16 18 18 18   Temp: 97.6 F (36.4 C) 98 F (36.7 C) (!) 97.3 F (36.3 C) 98.1 F (36.7 C)  TempSrc: Oral Oral Oral Oral  SpO2: 97% 96% 96% 97%  Weight: 77 kg       Intake/Output Summary (Last 24 hours) at 09/05/2020 1446 Last data filed at 09/05/2020 1359 Gross per 24 hour  Intake 1120 ml  Output 300 ml  Net 820 ml   Filed Weights   09/02/20 0919 09/03/20 1348 09/04/20 2306  Weight: 79.6 kg 79.2 kg 77 kg    Examination:   Constitutional: NAD, AAOx3, frial HEENT: conjunctivae and lids normal, EOMI CV: No cyanosis.   RESP: normal respiratory effort, on RA Extremities: No effusions, edema in BLE SKIN: warm, dry Neuro: II - XII grossly intact.   Psych: Normal mood and affect.  Appropriate judgement and reason   Data Reviewed: I have personally reviewed following labs and imaging studies  CBC: Recent Labs  Lab 09/02/20 0938 09/03/20 0626 09/03/20 1114 09/04/20 0400 09/05/20 0529  WBC 10.3 14.2*  --  13.4* 8.4  NEUTROABS 7.6  --   --   --   --  HGB 13.4 11.4* 11.0* 9.3* 8.5*  HCT 40.3 35.0* 34.4* 29.6* 26.3*  MCV 90.2 91.9  --  94.0 93.9  PLT 267 240  --  234 205   Basic Metabolic Panel: Recent Labs  Lab 09/02/20 0938 09/03/20 0626 09/04/20 0400 09/05/20 0529  NA 140 138 139 140  K 3.6 4.2 3.6 3.6  CL 107 111 110 112*  CO2 21* 21* 22 22  GLUCOSE 106* 124* 112* 96  BUN 24* 45* 51* 40*  CREATININE 1.41* 1.22 1.25* 1.30*  CALCIUM 9.7 9.2 8.6* 8.4*  MG  --   --  2.4 2.3   GFR: Estimated Creatinine Clearance: 36.9 mL/min (A) (by C-G  formula based on SCr of 1.3 mg/dL (H)). Liver Function Tests: Recent Labs  Lab 09/02/20 0938  AST 19  ALT 14  ALKPHOS 89  BILITOT 1.0  PROT 7.0  ALBUMIN 3.8   Recent Labs  Lab 09/02/20 0938  LIPASE 40   No results for input(s): AMMONIA in the last 168 hours. Coagulation Profile: Recent Labs  Lab 09/02/20 1541  INR 1.2   Cardiac Enzymes: Recent Labs  Lab 09/02/20 0938  CKTOTAL 39*   BNP (last 3 results) No results for input(s): PROBNP in the last 8760 hours. HbA1C: No results for input(s): HGBA1C in the last 72 hours. CBG: Recent Labs  Lab 09/03/20 0639  GLUCAP 126*   Lipid Profile: No results for input(s): CHOL, HDL, LDLCALC, TRIG, CHOLHDL, LDLDIRECT in the last 72 hours. Thyroid Function Tests: No results for input(s): TSH, T4TOTAL, FREET4, T3FREE, THYROIDAB in the last 72 hours. Anemia Panel: No results for input(s): VITAMINB12, FOLATE, FERRITIN, TIBC, IRON, RETICCTPCT in the last 72 hours. Sepsis Labs: No results for input(s): PROCALCITON, LATICACIDVEN in the last 168 hours.  Recent Results (from the past 240 hour(s))  Resp Panel by RT-PCR (Flu A&B, Covid) Nasopharyngeal Swab     Status: None   Collection Time: 09/02/20  9:38 AM   Specimen: Nasopharyngeal Swab; Nasopharyngeal(NP) swabs in vial transport medium  Result Value Ref Range Status   SARS Coronavirus 2 by RT PCR NEGATIVE NEGATIVE Final    Comment: (NOTE) SARS-CoV-2 target nucleic acids are NOT DETECTED.  The SARS-CoV-2 RNA is generally detectable in upper respiratory specimens during the acute phase of infection. The lowest concentration of SARS-CoV-2 viral copies this assay can detect is 138 copies/mL. A negative result does not preclude SARS-Cov-2 infection and should not be used as the sole basis for treatment or other patient management decisions. A negative result may occur with  improper specimen collection/handling, submission of specimen other than nasopharyngeal swab, presence of  viral mutation(s) within the areas targeted by this assay, and inadequate number of viral copies(<138 copies/mL). A negative result must be combined with clinical observations, patient history, and epidemiological information. The expected result is Negative.  Fact Sheet for Patients:  BloggerCourse.com  Fact Sheet for Healthcare Providers:  SeriousBroker.it  This test is no t yet approved or cleared by the [place] FDA and  has been authorized for detection and/or diagnosis of SARS-CoV-2 by FDA under an Emergency Use Authorization (EUA). This EUA will remain  in effect (meaning this test can be used) for the duration of the COVID-19 declaration under Section 564(b)(1) of the Act, 21 U.S.C.section 360bbb-3(b)(1), unless the authorization is terminated  or revoked sooner.       Influenza A by PCR NEGATIVE NEGATIVE Final   Influenza B by PCR NEGATIVE NEGATIVE Final    Comment: (NOTE) The  Xpert Xpress SARS-CoV-2/FLU/RSV plus assay is intended as an aid in the diagnosis of influenza from Nasopharyngeal swab specimens and should not be used as a sole basis for treatment. Nasal washings and aspirates are unacceptable for Xpert Xpress SARS-CoV-2/FLU/RSV testing.  Fact Sheet for Patients: BloggerCourse.com  Fact Sheet for Healthcare Providers: SeriousBroker.it  This test is not yet approved or cleared by the [place] FDA and has been authorized for detection and/or diagnosis of SARS-CoV-2 by FDA under an Emergency Use Authorization (EUA). This EUA will remain in effect (meaning this test can be used) for the duration of the COVID-19 declaration under Section 564(b)(1) of the Act, 21 U.S.C. section 360bbb-3(b)(1), unless the authorization is terminated or revoked.  Performed at [facility], [address], [place], [place] [address]       Radiology  Studies: No results found.   Scheduled Meds: . acetaminophen  1,000 mg Oral Once  . amLODipine  5 mg Oral Daily  . atorvastatin  10 mg Oral QPM  . loratadine  10 mg Oral Daily  . losartan  25 mg Oral Daily  . metoprolol tartrate  50 mg Oral BID  . multivitamin with minerals  1 tablet Oral Daily  . vitamin B-12  1,000 mcg Oral Daily   Continuous Infusions:   LOS: 2 days     Darlin Priestly, MD [facility] If 7PM-7AM, please contact night-coverage 09/05/2020, 2:46 PM

## 2020-09-05 NOTE — Anesthesia Postprocedure Evaluation (Signed)
Anesthesia Post Note  Patient: Matthew Greer [facility]  Procedure(s) Performed: ESOPHAGOGASTRODUODENOSCOPY (EGD) (N/A ) FLEXIBLE SIGMOIDOSCOPY (N/A )  Patient location during evaluation: Endoscopy Anesthesia Type: General Level of consciousness: awake and alert and oriented Pain management: pain level controlled Vital Signs Assessment: post-procedure vital signs reviewed and stable Respiratory status: spontaneous breathing Cardiovascular status: blood pressure returned to baseline Anesthetic complications: no   No complications documented.   Last Vitals:  Vitals:   09/05/20 0411 09/05/20 0804  BP: 132/63 (!) 144/73  Pulse: 75 77  Resp: 18 18  Temp: 36.7 C (!) 36.3 C  SpO2: 96% 96%    Last Pain:  Vitals:   09/05/20 1012  TempSrc:   PainSc: 1                  Matthew Greer

## 2020-09-05 NOTE — Progress Notes (Signed)
   09/03/20 0746  Assess: MEWS Score  Temp 98.2 F (36.8 C)  BP (!) 146/73  Pulse Rate (!) 124  Resp 20  Level of Consciousness Alert  SpO2 99 %  O2 Device Room Air  Assess: MEWS Score  MEWS Temp 0  MEWS Systolic 0  MEWS Pulse 2  MEWS RR 0  MEWS LOC 0  MEWS Score 2  MEWS Score Color Yellow  Assess: if the MEWS score is Yellow or Red  Were vital signs taken at a resting state? Yes  Focused Assessment Change from prior assessment (see assessment flowsheet)  Early Detection of Sepsis Score *See Row Information* Low  MEWS guidelines implemented *See Row Information* Yes  Treat  MEWS Interventions Administered scheduled meds/treatments  Pain Scale 0-10  Pain Score 2  Pain Type Acute pain  Pain Location Leg  Pain Orientation Right  Pain Descriptors / Indicators Aching  Pain Frequency Intermittent (pain with movement)  Pain Onset On-going  Patients Stated Pain Goal 0  Take Vital Signs  Increase Vital Sign Frequency  Yellow: Q 2hr X 2 then Q 4hr X 2, if remains yellow, continue Q 4hrs  Escalate  MEWS: Escalate Yellow: discuss with charge nurse/RN and consider discussing with provider and RRT  Notify: Charge Nurse/RN  Name of Charge Nurse/RN Notified Musician (AD)  Date Charge Nurse/RN Notified 09/03/20  Time Charge Nurse/RN Notified 0800  Inserted for [employer] RN

## 2020-09-05 NOTE — Plan of Care (Signed)
  Problem: Clinical Measurements: Goal: Ability to maintain clinical measurements within normal limits will improve Outcome: Progressing Goal: Will remain free from infection Outcome: Progressing Goal: Diagnostic test results will improve Outcome: Progressing Goal: Respiratory complications will improve Outcome: Progressing Goal: Cardiovascular complication will be avoided Outcome: Progressing   Problem: Pain Managment: Goal: General experience of comfort will improve Outcome: Progressing   Problem: Safety: Goal: Ability to remain free from injury will improve Outcome: Progressing   Problem: Skin Integrity: Goal: Risk for impaired skin integrity will decrease Outcome: Progressing   Pt is involved in and agrees with the plan of care. V/S stable. Denies any pain or SOB. FC in place; draining well. No bleeding or hematoma on R groin. Slept well this shift.

## 2020-09-06 ENCOUNTER — Encounter: Payer: Self-pay | Admitting: Internal Medicine

## 2020-09-06 DIAGNOSIS — R079 Chest pain, unspecified: Secondary | ICD-10-CM

## 2020-09-06 DIAGNOSIS — I824Y1 Acute embolism and thrombosis of unspecified deep veins of right proximal lower extremity: Secondary | ICD-10-CM

## 2020-09-06 DIAGNOSIS — I824Z1 Acute embolism and thrombosis of unspecified deep veins of right distal lower extremity: Secondary | ICD-10-CM

## 2020-09-06 DIAGNOSIS — R042 Hemoptysis: Secondary | ICD-10-CM

## 2020-09-06 LAB — BASIC METABOLIC PANEL
Anion gap: 4 — ABNORMAL LOW (ref 5–15)
BUN: 37 mg/dL — ABNORMAL HIGH (ref 8–23)
CO2: 22 mmol/L (ref 22–32)
Calcium: 8.3 mg/dL — ABNORMAL LOW (ref 8.9–10.3)
Chloride: 113 mmol/L — ABNORMAL HIGH (ref 98–111)
Creatinine, Ser: 1.38 mg/dL — ABNORMAL HIGH (ref 0.61–1.24)
GFR, Estimated: 48 mL/min — ABNORMAL LOW (ref >60)
Glucose, Bld: 99 mg/dL (ref 70–99)
Potassium: 3.5 mmol/L (ref 3.5–5.1)
Sodium: 139 mmol/L (ref 135–145)

## 2020-09-06 LAB — CBC
HCT: 25.4 % — ABNORMAL LOW (ref 39.0–52.0)
Hemoglobin: 8.1 g/dL — ABNORMAL LOW (ref 13.0–17.0)
MCH: 30 pg (ref 26.0–34.0)
MCHC: 31.9 g/dL (ref 30.0–36.0)
MCV: 94.1 fL (ref 80.0–100.0)
Platelets: 202 10*3/uL (ref 150–400)
RBC: 2.7 MIL/uL — ABNORMAL LOW (ref 4.22–5.81)
RDW: 13.5 % (ref 11.5–15.5)
WBC: 8.2 10*3/uL (ref 4.0–10.5)
nRBC: 0 % (ref 0.0–0.2)

## 2020-09-06 LAB — MAGNESIUM: Magnesium: 2.1 mg/dL (ref 1.7–2.4)

## 2020-09-06 MED ORDER — PANTOPRAZOLE SODIUM 40 MG PO TBEC: 40.0000 mg | DELAYED_RELEASE_TABLET | Freq: Two times a day (BID) | ORAL | 0 refills | Status: AC

## 2020-09-06 NOTE — Care Management Important Message (Signed)
Important Message  Patient Details  Name: Matthew Greer MRN: [id number] Date of Birth: 06-04-1926   Medicare Important Message Given:  N/A - LOS <3 / Initial given by admissions  Initial Medicare IM given by Cyril Mourning, Patient Access Associate on 09/06/2020 at 1:31pm.   Johnell Comings 09/06/2020, 2:52 PM

## 2020-09-06 NOTE — Plan of Care (Signed)
  Problem: Education: Goal: Knowledge of General Education information will improve Description: Including pain rating scale, medication(s)/side effects and non-pharmacologic comfort measures 09/06/2020 1431 by Carrington Clamp, RN Outcome: Adequate for Discharge 09/06/2020 1240 by Carrington Clamp, RN Outcome: Progressing   Problem: Health Behavior/Discharge Planning: Goal: Ability to manage health-related needs will improve 09/06/2020 1431 by Lyrah Bradt, Gilford Rile, RN Outcome: Adequate for Discharge 09/06/2020 1240 by Carrington Clamp, RN Outcome: Progressing   Problem: Clinical Measurements: Goal: Ability to maintain clinical measurements within normal limits will improve 09/06/2020 1431 by Carrington Clamp, RN Outcome: Adequate for Discharge 09/06/2020 1240 by Carrington Clamp, RN Outcome: Progressing Goal: Will remain free from infection 09/06/2020 1431 by Carrington Clamp, RN Outcome: Adequate for Discharge 09/06/2020 1240 by Carrington Clamp, RN Outcome: Progressing Goal: Diagnostic test results will improve 09/06/2020 1431 by Carrington Clamp, RN Outcome: Adequate for Discharge 09/06/2020 1240 by Carrington Clamp, RN Outcome: Progressing Goal: Respiratory complications will improve 09/06/2020 1431 by Carrington Clamp, RN Outcome: Adequate for Discharge 09/06/2020 1240 by Carrington Clamp, RN Outcome: Progressing Goal: Cardiovascular complication will be avoided 09/06/2020 1431 by Carrington Clamp, RN Outcome: Adequate for Discharge 09/06/2020 1240 by Carrington Clamp, RN Outcome: Progressing   Problem: Activity: Goal: Risk for activity intolerance will decrease 09/06/2020 1431 by Carrington Clamp, RN Outcome: Adequate for Discharge 09/06/2020 1240 by Carrington Clamp, RN Outcome: Progressing   Problem: Nutrition: Goal: Adequate nutrition will be maintained 09/06/2020 1431 by Carrington Clamp, RN Outcome: Adequate for  Discharge 09/06/2020 1240 by Carrington Clamp, RN Outcome: Progressing   Problem: Coping: Goal: Level of anxiety will decrease 09/06/2020 1431 by Carrington Clamp, RN Outcome: Adequate for Discharge 09/06/2020 1240 by Carrington Clamp, RN Outcome: Progressing   Problem: Elimination: Goal: Will not experience complications related to bowel motility 09/06/2020 1431 by Carrington Clamp, RN Outcome: Adequate for Discharge 09/06/2020 1240 by Carrington Clamp, RN Outcome: Progressing Goal: Will not experience complications related to urinary retention 09/06/2020 1431 by Carrington Clamp, RN Outcome: Adequate for Discharge 09/06/2020 1240 by Carrington Clamp, RN Outcome: Progressing   Problem: Pain Managment: Goal: General experience of comfort will improve 09/06/2020 1431 by Carrington Clamp, RN Outcome: Adequate for Discharge 09/06/2020 1240 by Carrington Clamp, RN Outcome: Progressing   Problem: Safety: Goal: Ability to remain free from injury will improve 09/06/2020 1431 by Carrington Clamp, RN Outcome: Adequate for Discharge 09/06/2020 1240 by Carrington Clamp, RN Outcome: Progressing   Problem: Skin Integrity: Goal: Risk for impaired skin integrity will decrease 09/06/2020 1431 by Carrington Clamp, RN Outcome: Adequate for Discharge 09/06/2020 1240 by Carrington Clamp, RN Outcome: Progressing

## 2020-09-06 NOTE — Progress Notes (Signed)
[facility] Daily Progress Note  Subjective: 09/04/20:             1.  U/S guided access left common femoral vein             2.  IVC venogram             3.  Placement of IVC filter  Patient without complaint this AM.  No issues overnight.  Objective: Vitals:   09/05/20 2035 09/05/20 2341 09/06/20 0517 09/06/20 0800  BP: 129/66 140/64 135/68 140/65  Pulse: 75 76 74 77  Resp: 18 20 16 18   Temp: 98.9 F (37.2 C) 98.7 F (37.1 C) 97.9 F (36.6 C) 97.7 F (36.5 C)  TempSrc:  Oral  Oral  SpO2: 96% 96% 95% 97%  Weight:        Intake/Output Summary (Last 24 hours) at 09/06/2020 1121 Last data filed at 09/06/2020 1038 Gross per 24 hour  Intake 840 ml  Output 850 ml  Net -10 ml   Physical Exam: A&Ox3, NAD CV: RRR Pulmonary: CTA Bilaterally Abdomen: Soft, Nontender, Nondistended Left Groin:  PAD intact.  No swelling, ecchymosis or drainage noted.  Vascular:  Right lower extremity: Thigh soft.  Calf soft.  Extremities warm distally toes.  Minimal edema.  Foot is warm.   Laboratory: CBC    Component Value Date/Time   WBC 8.2 09/06/2020 0709   HGB 8.1 (L) 09/06/2020 0709   HGB 13.3 07/12/2011 1042   HCT 25.4 (L) 09/06/2020 0709   HCT 40.2 07/12/2011 1042   PLT 202 09/06/2020 0709   PLT 229 07/12/2011 1042   BMET    Component Value Date/Time   NA 139 09/06/2020 0709   NA 142 07/12/2011 1042   K 3.5 09/06/2020 0709   K 3.9 07/12/2011 1042   CL 113 (H) 09/06/2020 0709   CL 107 07/12/2011 1042   CO2 22 09/06/2020 0709   CO2 26 07/12/2011 1042   GLUCOSE 99 09/06/2020 0709   GLUCOSE 99 07/12/2011 1042   BUN 37 (H) 09/06/2020 0709   BUN 19 (H) 07/12/2011 1042   CREATININE 1.38 (H) 09/06/2020 0709   CREATININE 1.17 07/12/2011 1042   CALCIUM 8.3 (L) 09/06/2020 0709   CALCIUM 9.0 07/12/2011 1042   GFRNONAA 48 (L) 09/06/2020 0709   GFRNONAA >60 07/12/2011 1042   GFRAA 56 (L) 10/02/2019 0501   GFRAA >60 07/12/2011 1042   Assessment/Planning: The  patient is a 87 year old male who presented with right lower extremity DVT in the setting of GI bleed on heparin and subsequent IVC filter insertion - POD#1  1) OK to remove PAD. Please remove 10cc an hour until deflated. 2) No further recommendations from vascular surgery at this time.  We will continue to surveilled the patient extending. 3) Okay from our standpoint once the PAD is removed to be discharged home when medically stable.  Discussed with Dr. 99 Lashaun Poch PA-C 09/06/2020 11:21 AM

## 2020-09-06 NOTE — Progress Notes (Signed)
Patient discharged with iv's removed, and PAD site benign .  Foley removed because spouse stated they do in and out catheters.

## 2020-09-06 NOTE — Discharge Summary (Signed)
Physician Discharge Summary   [name]  male DOB: [birth year]  ZOX:[id number]  PCP: Rolm Gala, MD  Admit date: 09/02/2020 Discharge date: 09/06/2020  Admitted From: home Disposition:  home Home Health: Yes CODE STATUS: DNR  Discharge Instructions    Discharge instructions   Complete by: As directed    You have received an IVC filter for the blood clot in your leg due to GI bleeding.    GI Dr. Norma Fredrickson is ok for you to resume aspirin and plavix.  Please follow up with him in outpatient clinic.  Please see your primary care doctor in about a week after discharge to check your blood counts.   Dr. Darlin Priestly - -   No wound care   Complete by: As directed        Hospital Course:  For full details, please see H&P, progress notes, consult notes and ancillary notes.  Briefly,  [name] a 87 y.o.malewith medical history significant forPrior CVA with right-sided hemiparesis, Hypertension,peripheral vascular disease, presented to the emergency department for chief concerns of right leg pain.  Found to have right LE DVT, got started on heparin gtt and then started having GI bleeds.  Upper GI bleed 2/2 Mallory-Weiss tear  --Pt started having hematemesis and liquid melena the day after admission in the setting of heparin gtt and being on home plavix. --EGD found Mallory-Weiss tear  --after anticoagulation and home plavix were held, bleeding stopped.  Hgb dropped from 13.4 on presentation to around 8's.  Pt was started on PPI.  GI cleared pt to resume home ASA and plavix, but anticoagulation was not resumed.  Pt will follow up with GI as outpatient.  Right femoral DVT -Suspect secondary to chronic bedbound state -CTA of the chest for PE was negative for pulmonary embolism. --started on heparin gtt on admission, then had GI bleed next day.  Pt received IVC filter with vascular surgery.  Anticoagulation was not resumed.  PVD Prior CVA --home ASA and Plavix held  during hospitalization due to GI bleed, but GI cleared for pt to resume at discharge.  Cholelithiasis-asymptomatic  Hypertension --continue home metop, amlodipine and losartan  Hyperlipidemia -atorvastatin 10 mg daily in the evening  Debility -patient gets daily PT at home  Paroxymal Afib with RVR --occurred in the setting of GI bleed, acute, resolved spontaneously without intervention.   Discharge Diagnoses:  Principal Problem:   Right femoral vein DVT (HCC) Active Problems:   Degenerative joint disease of shoulder region   Benign essential hypertension   Hearing loss   CVA (cerebral vascular accident) (HCC)   Weakness   Benign prostatic hyperplasia with urinary retention   Right hemiplegia (HCC)   GI bleed   30 Day Unplanned Readmission Risk Score   Flowsheet Row ED to Hosp-Admission (Current) from 09/02/2020 in [facility]  30 Day Unplanned Readmission Risk Score (%) 12.94 Filed at 09/06/2020 0801     This score is the patient's risk of an unplanned readmission within 30 days of being discharged (0 -100%). The score is based on dignosis, age, lab data, medications, orders, and past utilization.   Low:  0-14.9   Medium: 15-21.9   High: 22-29.9   Extreme: 30 and above        Discharge Instructions:  Allergies as of 09/06/2020      Reactions   Cephalexin Other (See Comments)   weakness   Chlorhexidine    Finasteride Nausea Only, Other (See Comments)  Reaction:  Confusion    Levofloxacin Other (See Comments)   Lisinopril Other (See Comments)   Reaction:  Confusion      Medication List    TAKE these medications   acetaminophen 325 MG tablet Commonly known as: TYLENOL Take 325-650 mg by mouth every 6 (six) hours as needed for mild pain, moderate pain, fever or headache.   amLODipine 5 MG tablet Commonly known as: NORVASC Take 1 tablet (5 mg total) by mouth daily.   aspirin 81 MG EC tablet Take 1 tablet (81 mg  total) by mouth daily.   atorvastatin 10 MG tablet Commonly known as: LIPITOR Take 10 mg by mouth every evening.   clopidogrel 75 MG tablet Commonly known as: PLAVIX Take 75 mg by mouth daily.   loratadine 10 MG tablet Commonly known as: CLARITIN Take 10 mg by mouth daily.   losartan 25 MG tablet Commonly known as: COZAAR Take 25 mg by mouth daily.   metoprolol tartrate 50 MG tablet Commonly known as: LOPRESSOR Take 1 tablet (50 mg total) by mouth 2 (two) times daily.   pantoprazole 40 MG tablet Commonly known as: Protonix Take 1 tablet (40 mg total) by mouth 2 (two) times daily. Acid reflux medication to help your esophagus heal.   Stool Softener 100 MG capsule Generic drug: docusate sodium        Follow-up Information    [place], Boykin Nearing, MD. Schedule an appointment as soon as possible for a visit in 1 month.   Specialty: Gastroenterology Why: they will call pt back Contact information: [address] [place] [address] [phone]        Rolm Gala, MD. Go on 09/13/2020.   Specialty: Family Medicine Why: check hemoglobin. (office  Contact information: [address] [place] [address] [phone]               Allergies  Allergen Reactions  . Cephalexin Other (See Comments)    weakness  . Chlorhexidine   . Finasteride Nausea Only and Other (See Comments)    Reaction:  Confusion   . Levofloxacin Other (See Comments)  . Lisinopril Other (See Comments)    Reaction:  Confusion     The results of significant diagnostics from this hospitalization (including imaging, microbiology, ancillary and laboratory) are listed below for reference.   Consultations:   Procedures/Studies: CT Head Wo Contrast  Result Date: 09/02/2020 CLINICAL DATA:  Headache. EXAM: CT HEAD WITHOUT CONTRAST TECHNIQUE: Contiguous axial images were obtained from the base of the skull through the vertex without intravenous contrast. COMPARISON:  None.  FINDINGS: Brain: Mild diffuse cortical atrophy is noted. Mild chronic ischemic white matter disease is noted. Old lacunar infarction is noted in left basal ganglia. No mass effect or midline shift is noted. Ventricular size is within normal limits. There is no evidence of mass lesion, hemorrhage or acute infarction. Vascular: No hyperdense vessel or unexpected calcification. Skull: Normal. Negative for fracture or focal lesion. Sinuses/Orbits: No acute finding. Other: None. IMPRESSION: Mild diffuse cortical atrophy. Mild chronic ischemic white matter disease. No acute intracranial abnormality seen. Electronically Signed   By: Lupita Raider M.D.   On: 09/02/2020 13:06   CT Angio Chest PE W and/or Wo Contrast  Result Date: 09/02/2020 CLINICAL DATA:  Chest pain radiating to the left arm EXAM: CT ANGIOGRAPHY CHEST WITH CONTRAST TECHNIQUE: Multidetector CT imaging of the chest was performed using the standard protocol during bolus administration of intravenous contrast. Multiplanar CT image reconstructions and  MIPs were obtained to evaluate the vascular anatomy. CONTRAST:  39mL OMNIPAQUE IOHEXOL 350 MG/ML SOLN COMPARISON:  05/28/2017, 07/18/2011 FINDINGS: Cardiovascular: Satisfactory opacification of the pulmonary arteries to the segmental level. No evidence of pulmonary embolism. Thoracic aorta is nonaneurysmal. Atherosclerotic calcifications of the aorta and coronary arteries. Normal heart size. No pericardial effusion. Mediastinum/Nodes: Chronically prominent mediastinal and bilateral hilar lymph nodes, some of which contain punctate calcifications. Findings suggest sequela of chronic granulomatous disease. No enlarging mediastinal or hilar lymph nodes. No axillary lymphadenopathy. Thyroid unremarkable. Trachea within normal limits. Moderate-sized hiatal hernia. Lungs/Pleura: Prominent biapical pleuroparenchymal scarring. Benign 5 mm nodule at the right lung apex, unchanged. Dependent bibasilar subsegmental  atelectasis. No airspace consolidation, pleural effusion, or pneumothorax. Upper Abdomen: Cholelithiasis. Musculoskeletal: No acute osseous findings. Advanced bilateral glenohumeral joint osteoarthritis, left worse than right. Review of the MIP images confirms the above findings. IMPRESSION: 1. Negative for pulmonary embolism or other acute intrathoracic process. 2. Chronically prominent mediastinal and bilateral hilar lymph nodes, some of which contain punctate calcifications. Findings suggest sequela of chronic granulomatous disease. 3. Moderate-sized hiatal hernia. 4. Cholelithiasis. 5. Aortic and coronary artery atherosclerosis (ICD10-I70.0). Electronically Signed   By: Duanne Guess D.O.   On: 09/02/2020 13:10   PERIPHERAL VASCULAR CATHETERIZATION  Result Date: 09/06/2020   Patient name: Matthew Greer            MRN: [id number]        DOB: 1925/12/15            Sex: male  09/04/2020 Pre-operative Diagnosis: DVT Post-operative diagnosis:  Same Surgeon:  Durene Cal Procedure Performed:             1.  U/S guided access left common femoral vein             2.  IVC venogram             3.  Placement of IVC filter               Indications:  This is a 87 year old with right femoral vein DVT who was started on anti-coagulation but developed a GI bleed and so IVC filter was recommended.  Procedure:  The patient was identified in the Cath lab.  The patient was then placed supine on the table and prepped and draped in the usual sterile fashion.  A time out was called.  Ultrasound was used to evaluate the left common femoral vein.  It was patent and compressible .  A digital ultrasound image was acquired.  A micropuncture needle was used to access the left common femoral vein under ultrasound guidance.  An 018 wire was advanced without resistance and a micropuncture sheath was placed.  The 018 wire was removed and a benson wire was placed. A pig tail catheter was placed into the vena cava  Findings:              Vena Cava:  Patent without anatomic variations.  Normal diameter  Intervention:  After the images were acquired, a dilator was used to dilate the subcutaneous tissue and the filter introducing sheath was inserted.  The [employer] was then inserted and deployed at the mid L-3 vertebral body.  The sheath was removed and manual pressure was held for hemostasis.  There were no complications.  Impression:             #1  Successful placement of [employer] IVC filter at the Mid L-3 vertebral body.  Juleen China, M.D., FACS [facility] Office: [phone] Pager:  [phone]  US Venous Img Lower Unilateral Right  Result Date: 09/02/2020 CLINICAL DATA:  Pain EXAM: RIGHT LOWER EXTREMITY VENOUS DOPPLER ULTRASOUND TECHNIQUE: Gray-scale sonography with compression, as well as color and duplex ultrasound, were performed to evaluate the deep venous system(s) from the level of the common femoral vein through the popliteal and proximal calf veins. COMPARISON:  None. FINDINGS: VENOUS Short segment partially occlusive thrombus within the femoral vein. Otherwise, normal compressibility of the common femoral, superficial femoral, and popliteal veins, as well as the visualized calf veins. Visualized portions of profunda femoral vein and great saphenous vein unremarkable. Besides the femoral vein, no other filling defects to suggest DVT on grayscale or color Doppler imaging. Limited views of the contralateral common femoral vein are unremarkable. IMPRESSION: Short segment partially occlusive thrombus within the right femoral vein. Electronically Signed   By: Feliberto Harts MD   On: 09/02/2020 11:31   DG Chest Port 1 View  Result Date: 09/03/2020 CLINICAL DATA:  Hemoptysis.  Chest pain EXAM: PORTABLE CHEST 1 VIEW COMPARISON:  Yesterday FINDINGS: Mild atelectasis/scarring at the bases. Biapical pleural based thickening. There is no edema, consolidation, effusion, or  pneumothorax. Normal heart size and stable mediastinal contours. IMPRESSION: Stable from yesterday.  No evidence of acute disease. Electronically Signed   By: Marnee Spring M.D.   On: 09/03/2020 07:18   DG Chest Portable 1 View  Result Date: 09/02/2020 CLINICAL DATA:  Chest pain EXAM: PORTABLE CHEST 1 VIEW COMPARISON:  09/30/2019 FINDINGS: Cardiomediastinal silhouette and pulmonary vasculature are within normal limits. Small hiatal hernia. Bibasilar atelectasis/scarring again seen. IMPRESSION: No acute cardiopulmonary process. Electronically Signed   By: Acquanetta Belling M.D.   On: 09/02/2020 10:53      Labs: BNP (last 3 results) Recent Labs    09/02/20 0940  BNP 104.8*   Basic Metabolic Panel: Recent Labs  Lab 09/02/20 0938 09/03/20 0626 09/04/20 0400 09/05/20 0529 09/06/20 0709  NA 140 138 139 140 139  K 3.6 4.2 3.6 3.6 3.5  CL 107 111 110 112* 113*  CO2 21* 21* 22 22 22   GLUCOSE 106* 124* 112* 96 99  BUN 24* 45* 51* 40* 37*  CREATININE 1.41* 1.22 1.25* 1.30* 1.38*  CALCIUM 9.7 9.2 8.6* 8.4* 8.3*  MG  --   --  2.4 2.3 2.1   Liver Function Tests: Recent Labs  Lab 09/02/20 0938  AST 19  ALT 14  ALKPHOS 89  BILITOT 1.0  PROT 7.0  ALBUMIN 3.8   Recent Labs  Lab 09/02/20 0938  LIPASE 40   No results for input(s): AMMONIA in the last 168 hours. CBC: Recent Labs  Lab 09/02/20 0938 09/03/20 0626 09/03/20 1114 09/04/20 0400 09/05/20 0529 09/06/20 0709  WBC 10.3 14.2*  --  13.4* 8.4 8.2  NEUTROABS 7.6  --   --   --   --   --   HGB 13.4 11.4* 11.0* 9.3* 8.5* 8.1*  HCT 40.3 35.0* 34.4* 29.6* 26.3* 25.4*  MCV 90.2 91.9  --  94.0 93.9 94.1  PLT 267 240  --  234 205 202   Cardiac Enzymes: Recent Labs  Lab 09/02/20 0938  CKTOTAL 39*   BNP: Invalid input(s): POCBNP CBG: Recent Labs  Lab 09/03/20 0639  GLUCAP 126*   D-Dimer No results for input(s): DDIMER in the last 72 hours. Hgb A1c No results for input(s): HGBA1C in the last 72 hours. Lipid  Profile No results for input(s): CHOL, HDL, LDLCALC, TRIG, CHOLHDL, LDLDIRECT in the last 72 hours. Thyroid function studies No results for input(s): TSH, T4TOTAL, T3FREE, THYROIDAB in the last 72 hours.  Invalid input(s): FREET3 Anemia work up No results for input(s): VITAMINB12, FOLATE, FERRITIN, TIBC, IRON, RETICCTPCT in the last 72 hours. Urinalysis    Component Value Date/Time   COLORURINE YELLOW (A) 09/02/2020 1541   APPEARANCEUR CLOUDY (A) 09/02/2020 1541   APPEARANCEUR Cloudy (A) 10/17/2019 1042   LABSPEC 1.021 09/02/2020 1541   LABSPEC 1.010 07/12/2011 1236   PHURINE 5.0 09/02/2020 1541   GLUCOSEU NEGATIVE 09/02/2020 1541   GLUCOSEU Negative 07/12/2011 1236   HGBUR SMALL (A) 09/02/2020 1541   BILIRUBINUR NEGATIVE 09/02/2020 1541   BILIRUBINUR Negative 10/17/2019 1042   BILIRUBINUR Negative 07/12/2011 1236   KETONESUR NEGATIVE 09/02/2020 1541   PROTEINUR NEGATIVE 09/02/2020 1541   NITRITE POSITIVE (A) 09/02/2020 1541   LEUKOCYTESUR LARGE (A) 09/02/2020 1541   LEUKOCYTESUR Negative 07/12/2011 1236   Sepsis Labs Invalid input(s): PROCALCITONIN,  WBC,  LACTICIDVEN Microbiology Recent Results (from the past 240 hour(s))  Resp Panel by RT-PCR (Flu A&B, Covid) Nasopharyngeal Swab     Status: None   Collection Time: 09/02/20  9:38 AM   Specimen: Nasopharyngeal Swab; Nasopharyngeal(NP) swabs in vial transport medium  Result Value Ref Range Status   SARS Coronavirus 2 by RT PCR NEGATIVE NEGATIVE Final    Comment: (NOTE) SARS-CoV-2 target nucleic acids are NOT DETECTED.  The SARS-CoV-2 RNA is generally detectable in upper respiratory specimens during the acute phase of infection. The lowest concentration of SARS-CoV-2 viral copies this assay can detect is 138 copies/mL. A negative result does not preclude SARS-Cov-2 infection and should not be used as the sole basis for treatment or other patient management decisions. A negative result may occur with  improper specimen  collection/handling, submission of specimen other than nasopharyngeal swab, presence of viral mutation(s) within the areas targeted by this assay, and inadequate number of viral copies(<138 copies/mL). A negative result must be combined with clinical observations, patient history, and epidemiological information. The expected result is Negative.  Fact Sheet for Patients:  BloggerCourse.com  Fact Sheet for Healthcare Providers:  SeriousBroker.it  This test is no t yet approved or cleared by the [place] FDA and  has been authorized for detection and/or diagnosis of SARS-CoV-2 by FDA under an Emergency Use Authorization (EUA). This EUA will remain  in effect (meaning this test can be used) for the duration of the COVID-19 declaration under Section 564(b)(1) of the Act, 21 U.S.C.section 360bbb-3(b)(1), unless the authorization is terminated  or revoked sooner.       Influenza A by PCR NEGATIVE NEGATIVE Final   Influenza B by PCR NEGATIVE NEGATIVE Final    Comment: (NOTE) The Xpert Xpress SARS-CoV-2/FLU/RSV plus assay is intended as an aid in the diagnosis of influenza from Nasopharyngeal swab specimens and should not be used as a sole basis for treatment. Nasal washings and aspirates are unacceptable for Xpert Xpress SARS-CoV-2/FLU/RSV testing.  Fact Sheet for Patients: BloggerCourse.com  Fact Sheet for Healthcare Providers: SeriousBroker.it  This test is not yet approved or cleared by the [place] FDA and has been authorized for detection and/or diagnosis of SARS-CoV-2 by FDA under an Emergency Use Authorization (EUA). This EUA will remain in effect (meaning this test can be used) for the duration of the COVID-19 declaration under Section 564(b)(1) of the Act, 21 U.S.C. section 360bbb-3(b)(1), unless the authorization is terminated or revoked.  Performed at [facility], [address] [place], [place] [id number]      Total time spend on discharging this patient, including the last patient exam, discussing the hospital stay, instructions for ongoing care as it relates to all pertinent caregivers, as well as preparing the medical discharge records, prescriptions, and/or referrals as applicable, is 40 minutes.    Darlin Priestlyina Thaddeus Evitts, MD  [facility] 09/06/2020, 10:15 AM

## 2020-09-06 NOTE — Plan of Care (Signed)

## 2020-09-08 ENCOUNTER — Emergency Department
Admission: EM | Admit: 2020-09-08 | Discharge: 2020-09-08 | Disposition: A | Discharge status: Home / Self Care | Payer: Medicare Other | Attending: Emergency Medicine | Admitting: Emergency Medicine

## 2020-09-08 ENCOUNTER — Encounter: Payer: Self-pay | Admitting: Emergency Medicine

## 2020-09-08 ENCOUNTER — Other Ambulatory Visit: Payer: Self-pay

## 2020-09-08 DIAGNOSIS — Z7902 Long term (current) use of antithrombotics/antiplatelets: Secondary | ICD-10-CM | POA: Diagnosis not present

## 2020-09-08 DIAGNOSIS — M79604 Pain in right leg: Secondary | ICD-10-CM | POA: Diagnosis present

## 2020-09-08 DIAGNOSIS — Z79899 Other long term (current) drug therapy: Secondary | ICD-10-CM | POA: Insufficient documentation

## 2020-09-08 DIAGNOSIS — Z87891 Personal history of nicotine dependence: Secondary | ICD-10-CM | POA: Insufficient documentation

## 2020-09-08 DIAGNOSIS — I82401 Acute embolism and thrombosis of unspecified deep veins of right lower extremity: Secondary | ICD-10-CM | POA: Insufficient documentation

## 2020-09-08 DIAGNOSIS — I82411 Acute embolism and thrombosis of right femoral vein: Secondary | ICD-10-CM

## 2020-09-08 DIAGNOSIS — I10 Essential (primary) hypertension: Secondary | ICD-10-CM | POA: Insufficient documentation

## 2020-09-08 DIAGNOSIS — Z7982 Long term (current) use of aspirin: Secondary | ICD-10-CM | POA: Diagnosis not present

## 2020-09-08 LAB — COMPREHENSIVE METABOLIC PANEL
ALT: 27 U/L (ref 0–44)
AST: 24 U/L (ref 15–41)
Albumin: 3.3 g/dL — ABNORMAL LOW (ref 3.5–5.0)
Alkaline Phosphatase: 70 U/L (ref 38–126)
Anion gap: 7 (ref 5–15)
BUN: 26 mg/dL — ABNORMAL HIGH (ref 8–23)
CO2: 23 mmol/L (ref 22–32)
Calcium: 9.2 mg/dL (ref 8.9–10.3)
Chloride: 108 mmol/L (ref 98–111)
Creatinine, Ser: 1.29 mg/dL — ABNORMAL HIGH (ref 0.61–1.24)
GFR, Estimated: 52 mL/min — ABNORMAL LOW (ref >60)
Glucose, Bld: 92 mg/dL (ref 70–99)
Potassium: 3.7 mmol/L (ref 3.5–5.1)
Sodium: 138 mmol/L (ref 135–145)
Total Bilirubin: 0.6 mg/dL (ref 0.3–1.2)
Total Protein: 6.2 g/dL — ABNORMAL LOW (ref 6.5–8.1)

## 2020-09-08 LAB — CBC
HCT: 28.5 % — ABNORMAL LOW (ref 39.0–52.0)
Hemoglobin: 9.3 g/dL — ABNORMAL LOW (ref 13.0–17.0)
MCH: 29.8 pg (ref 26.0–34.0)
MCHC: 32.6 g/dL (ref 30.0–36.0)
MCV: 91.3 fL (ref 80.0–100.0)
Platelets: 295 10*3/uL (ref 150–400)
RBC: 3.12 MIL/uL — ABNORMAL LOW (ref 4.22–5.81)
RDW: 13.4 % (ref 11.5–15.5)
WBC: 8.6 10*3/uL (ref 4.0–10.5)
nRBC: 0 % (ref 0.0–0.2)

## 2020-09-08 LAB — PROTIME-INR
INR: 1.1 (ref 0.8–1.2)
Prothrombin Time: 13.5 seconds (ref 11.4–15.2)

## 2020-09-08 MED ORDER — HYDROCODONE-ACETAMINOPHEN 5-325 MG PO TABS: 1.0000 | ORAL_TABLET | Freq: Four times a day (QID) | ORAL | 0 refills | Status: DC | PRN

## 2020-09-08 MED ORDER — HYDROCODONE-ACETAMINOPHEN 5-325 MG PO TABS
1.0000 | ORAL_TABLET | Freq: Once | ORAL | Status: AC
  Administered 2020-09-08: 1 via ORAL
  Filled 2020-09-08: qty 1

## 2020-09-08 NOTE — ED Notes (Signed)
Spoke with MD Cyril Loosen to change Pharmacy to the one off [address]

## 2020-09-08 NOTE — ED Triage Notes (Signed)
Pt comes into the ED via POV c/o pain management needs.  PT states he was discharged the other day for a blood clot in his leg.  IVC filter was placed, but the patient is still having pain in the left foot, leg, and shoulder back.  PT denies any CP, dizziness, SHOB. Pt was not discharged home with any pain medication and so he is here for pain management.

## 2020-09-08 NOTE — ED Notes (Signed)
Pt calm , collective , discharge with guardian

## 2020-09-11 NOTE — ED Provider Notes (Signed)
[facility] Emergency Department Provider Note   ____________________________________________    I have reviewed the triage vital signs and the nursing notes.   HISTORY  Chief Complaint Pain Management     HPI NORMAND DAMRON is a 87 y.o. male who presents with complaints of pain primarily in his right leg.  Patient recently admitted to the hospital for a right femoral DVT, started on heparin, unfortunately had GI bleeding with heparin, therefore had IVC filter placed, denies abdominal pain or pain at groin insertion site, complains of pain distally in his leg.  Does seem to improve with Tylenol however at night it is occasionally keeping him awake.  Does not have any other pain medications besides Tylenol.  Is here for pain management  Past Medical History:  Diagnosis Date  . Hypertension   . Stroke [facility]) 2013   "minor" - no deficits    Patient Active Problem List   Diagnosis Date Noted  . GI bleed 09/03/2020  . Right femoral vein DVT (HCC) 09/02/2020  . Right hemiplegia (HCC) 09/02/2020  . Benign prostatic hyperplasia with urinary retention 08/09/2020  . UTI (urinary tract infection) 10/01/2019  . Weakness 09/30/2019  . HTN (hypertension), malignant   . Acute cystitis without hematuria   . AKI (acute kidney injury) (HCC)   . Hearing impaired person, bilateral   . Obstructive uropathy 04/10/2019  . Acute CVA (cerebrovascular accident) (HCC) 04/10/2019  . Acute blood loss anemia 08/15/2016  . Hip hematoma, right, initial encounter 08/15/2016  . CVA (cerebral vascular accident) (HCC) 05/25/2016  . Problems with swallowing and mastication   . Stricture and stenosis of esophagus   . Benign essential hypertension 01/27/2016  . Kidney stones 01/27/2016  . Other nonspecific abnormal finding of lung field 01/27/2016  . Pure hypercholesterolemia 01/27/2016  . Stroke (HCC) 01/27/2016  . 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  Rotator cuff tendonitis 11/10/2013  . Hearing loss 04/09/2013  . Eczema 07/26/2012  . Abdominal pain 12/27/2011    Past Surgical History:  Procedure Laterality Date  . ESOPHAGEAL DILATION  02/24/2016   Procedure: ESOPHAGEAL DILATION;  Surgeon: Midge Minium, MD;  Location: [facility];  Service: Endoscopy;;  . ESOPHAGOGASTRODUODENOSCOPY N/A 09/03/2020   Procedure: ESOPHAGOGASTRODUODENOSCOPY (EGD);  Surgeon: Toledo, Boykin Nearing, MD;  Location: [facility] END[facility];  Service: Gastroenterology;  Laterality: N/A;  . ESOPHAGOGASTRODUODENOSCOPY (EGD) WITH PROPOFOL N/A 02/24/2016   Procedure: ESOPHAGOGASTRODUODENOSCOPY (EGD) WITH PROPOFOL;  Surgeon: Midge Minium, MD;  Location: [facility];  Service: Endoscopy;  Laterality: N/A;  . ESOPHAGOGASTRODUODENOSCOPY ENDOSCOPY  03/2013   Dr. Servando Snare  . FLEXIBLE SIGMOIDOSCOPY N/A 09/03/2020   Procedure: FLEXIBLE SIGMOIDOSCOPY;  Surgeon: Toledo, Boykin Nearing, MD;  Location: [facility] END[facility]COPY;  Service: Gastroenterology;  Laterality: N/A;  . IVC FILTER INSERTION N/A 09/04/2020   Procedure: IVC FILTER INSERTION;  Surgeon: Nada Libman, MD;  Location: [facility];  Service: Cardiovascular;  Laterality: N/A;  . THORACOTOMY  1953    Prior to Admission medications   Medication Sig Start Date End Date Taking? Authorizing Provider  acetaminophen (TYLENOL) 325 MG tablet Take 325-650 mg by mouth every 6 (six) hours as needed for mild pain, moderate pain, fever or headache.     [provider]  amLODipine (NORVASC) 5 MG tablet Take 1 tablet (5 mg total) by mouth daily. 04/13/19   Enedina Finner, MD  aspirin EC 81 MG EC tablet Take 1 tablet (81 mg total) by mouth daily. 04/13/19  Enedina Finner, MD  atorvastatin (LIPITOR) 10 MG tablet Take 10 mg by mouth every evening.     [provider]  clopidogrel (PLAVIX) 75 MG tablet Take 75 mg by mouth daily.    [provider]  HYDROcodone-acetaminophen (NORCO/VICODIN) 5-325 MG tablet Take 1 tablet by  mouth every 6 (six) hours as needed for severe pain. 09/08/20   Jene Every, MD  loratadine (CLARITIN) 10 MG tablet Take 10 mg by mouth daily.    [provider]  losartan (COZAAR) 25 MG tablet Take 25 mg by mouth daily. 09/15/19   [provider]  metoprolol (LOPRESSOR) 50 MG tablet Take 1 tablet (50 mg total) by mouth 2 (two) times daily. 05/28/16   Adrian Saran, MD  pantoprazole (PROTONIX) 40 MG tablet Take 1 tablet (40 mg total) by mouth 2 (two) times daily. Acid reflux medication to help your esophagus heal. 09/06/20 10/06/20  Darlin Priestly, MD  STOOL SOFTENER 100 MG capsule  04/22/20   [provider]     Allergies Cephalexin, Chlorhexidine, Finasteride, Levofloxacin, and Lisinopril  History reviewed. No pertinent family history.  Social History Social History   Tobacco Use  . Smoking status: Former Games developer  . Smokeless tobacco: Never Used  Substance Use Topics  . Alcohol use: No  . Drug use: Never    Review of Systems  Constitutional: No fever/chills Eyes: No visual changes.  ENT: No sore throat. Cardiovascular: Denies chest pain. Respiratory: Denies shortness of breath. Gastrointestinal: No abdominal pain.  No nausea, no vomiting.   Genitourinary: Negative for dysuria. Musculoskeletal: As above Skin: No pallor or erythema Neurological: No numbness or weakness   ____________________________________________   PHYSICAL EXAM:  VITAL SIGNS: ED Triage Vitals  Enc Vitals Group     BP 09/08/20 1222 128/62     Pulse Rate 09/08/20 1222 66     Resp 09/08/20 1222 16     Temp 09/08/20 1222 97.6 F (36.4 C)     Temp Source 09/08/20 1222 Oral     SpO2 09/08/20 1222 100 %     Weight 09/08/20 1226 68 kg (150 lb)     Height 09/08/20 1226 1.803 m (5\' 11" )     Head Circumference --      Peak Flow --      Pain Score 09/08/20 1225 6     Pain Loc --      Pain Edu? --      Excl. in GC? --     Constitutional: Alert and oriented.   Nose: No  congestion/rhinnorhea. Mouth/Throat: Mucous membranes are moist.   Neck:  Painless ROM Cardiovascular: Normal rate, regular rhythm. Grossly normal heart sounds.  Good peripheral circulation. Respiratory: Normal respiratory effort.  No retractions. Lungs CTAB. Gastrointestinal: Soft and nontender. No distention.  No CVA tenderness. Genitourinary: deferred Musculoskeletal: Right lower extremity is warm and well perfused, no significant swelling. Neurologic:   No gross focal neurologic deficits are appreciated.  Skin:  Skin is warm, dry and intact. No rash noted. Psychiatric: Mood and affect are normal. Speech and behavior are normal.  ____________________________________________   LABS (all labs ordered are listed, but only abnormal results are displayed)  Labs Reviewed  CBC - Abnormal; Notable for the following components:      Result Value   RBC 3.12 (*)    Hemoglobin 9.3 (*)    HCT 28.5 (*)    All other components within normal limits  COMPREHENSIVE METABOLIC PANEL - Abnormal; Notable for the following components:  BUN 26 (*)    Creatinine, Ser 1.29 (*)    Total Protein 6.2 (*)    Albumin 3.3 (*)    GFR, Estimated 52 (*)    All other components within normal limits  PROTIME-INR   ____________________________________________  EKG  None ____________________________________________  RADIOLOGY   ____________________________________________   PROCEDURES  Procedure(s) performed: No  Procedures   Critical Care performed: No ____________________________________________   INITIAL IMPRESSION / ASSESSMENT AND PLAN / ED COURSE  Pertinent labs & imaging results that were available during my care of the patient were reviewed by me and considered in my medical decision making (see chart for details).  Reviewed patient's recent medical admission.  Right femoral DVT, IVC filter placed with left groin access.  Exam is reassuring.  Pain is almost certainly from femoral  DVT, additional analgesics provided, close outpatient follow-up with vascular recommended.  Return precautions discussed, patient and family agree with plan.    ____________________________________________   FINAL CLINICAL IMPRESSION(S) / ED DIAGNOSES  Final diagnoses:  Acute deep vein thrombosis (DVT) of femoral vein of right lower extremity (HCC)        Note:  This document was prepared using Dragon voice recognition software and may include unintentional dictation errors.   Jene Every, MD 09/11/20 [phone]

## 2020-11-02 ENCOUNTER — Encounter (INDEPENDENT_AMBULATORY_CARE_PROVIDER_SITE_OTHER): Payer: Self-pay | Admitting: Vascular Surgery

## 2020-11-09 ENCOUNTER — Ambulatory Visit (INDEPENDENT_AMBULATORY_CARE_PROVIDER_SITE_OTHER): Payer: Medicare Other | Admitting: Vascular Surgery

## 2020-11-09 ENCOUNTER — Other Ambulatory Visit: Payer: Self-pay

## 2020-11-09 ENCOUNTER — Encounter (INDEPENDENT_AMBULATORY_CARE_PROVIDER_SITE_OTHER): Payer: Self-pay | Admitting: Vascular Surgery

## 2020-11-09 VITALS — BP 134/66 | HR 59 | Resp 16 | Ht 71.0 in | Wt 150.0 lb

## 2020-11-09 DIAGNOSIS — I639 Cerebral infarction, unspecified: Secondary | ICD-10-CM

## 2020-11-09 DIAGNOSIS — Z8719 Personal history of other diseases of the digestive system: Secondary | ICD-10-CM

## 2020-11-09 DIAGNOSIS — I10 Essential (primary) hypertension: Secondary | ICD-10-CM | POA: Diagnosis not present

## 2020-11-09 DIAGNOSIS — I82411 Acute embolism and thrombosis of right femoral vein: Secondary | ICD-10-CM

## 2020-11-09 NOTE — Progress Notes (Signed)
Patient ID: Matthew Greer, male   DOB: 1926/02/16, 87 y.o.   MRN: [id number]  Chief Complaint  Patient presents with  . New Patient (Initial Visit)    Post IVC filter    HPI Matthew Greer is a 87 y.o. male.  I am asked to see the patient by Dr. Cyril Loosen for evaluation of an IVC filter which was placed about 2 months ago by covering physician at our hospital.  The patient is doing fairly well.  He had GI bleed which was the reason for the filter placement.  This seems to resolve.  No leg pain or swelling.  He is wheelchair-bound and his friend with him provides much of his history.     Past Medical History:  Diagnosis Date  . Hypertension   . Stroke [facility]) 2013   "minor" - no deficits    Past Surgical History:  Procedure Laterality Date  . ESOPHAGEAL DILATION  02/24/2016   Procedure: ESOPHAGEAL DILATION;  Surgeon: Midge Minium, MD;  Location: [facility];  Service: Endoscopy;;  . ESOPHAGOGASTRODUODENOSCOPY N/A 09/03/2020   Procedure: ESOPHAGOGASTRODUODENOSCOPY (EGD);  Surgeon: Toledo, Boykin Nearing, MD;  Location: [facility] ENDOSCOPY;  Service: Gastroenterology;  Laterality: N/A;  . ESOPHAGOGASTRODUODENOSCOPY (EGD) WITH PROPOFOL N/A 02/24/2016   Procedure: ESOPHAGOGASTRODUODENOSCOPY (EGD) WITH PROPOFOL;  Surgeon: Midge Minium, MD;  Location: [facility];  Service: Endoscopy;  Laterality: N/A;  . ESOPHAGOGASTRODUODENOSCOPY ENDOSCOPY  03/2013   Dr. Servando Snare  . FLEXIBLE SIGMOIDOSCOPY N/A 09/03/2020   Procedure: FLEXIBLE SIGMOIDOSCOPY;  Surgeon: Toledo, Boykin Nearing, MD;  Location: [facility] ENDOSCOPY;  Service: Gastroenterology;  Laterality: N/A;  . IVC FILTER INSERTION N/A 09/04/2020   Procedure: IVC FILTER INSERTION;  Surgeon: Nada Libman, MD;  Location: [facility] INVASIVE CV LAB;  Service: Cardiovascular;  Laterality: N/A;  . THORACOTOMY  1953     Family History He cannot really provide and not well known.  No reported history of aneurysms, bleeding or clotting disorders, or other vascular  problems.   Social History   Tobacco Use  . Smoking status: Former Games developer  . Smokeless tobacco: Never Used  Substance Use Topics  . Alcohol use: No  . Drug use: Never    Allergies  Allergen Reactions  . Cephalexin Other (See Comments)    weakness  . Chlorhexidine   . Finasteride Nausea Only and Other (See Comments)    Reaction:  Confusion   . Levofloxacin Other (See Comments)  . Lisinopril Other (See Comments)    Reaction:  Confusion    Current Outpatient Medications  Medication Sig Dispense Refill  . acetaminophen (TYLENOL) 325 MG tablet Take 325-650 mg by mouth every 6 (six) hours as needed for mild pain, moderate pain, fever or headache.     Marland Kitchen amLODipine (NORVASC) 5 MG tablet Take 1 tablet (5 mg total) by mouth daily. 30 tablet 2  . aspirin EC 81 MG EC tablet Take 1 tablet (81 mg total) by mouth daily. 30 tablet 1  . atorvastatin (LIPITOR) 10 MG tablet Take 10 mg by mouth every evening.     . clopidogrel (PLAVIX) 75 MG tablet Take 75 mg by mouth daily.    . famotidine (PEPCID) 40 MG tablet Take 40 mg by mouth 2 (two) times daily.    Marland Kitchen HYDROcodone-acetaminophen (NORCO/VICODIN) 5-325 MG tablet Take 1 tablet by mouth every 6 (six) hours as needed for severe pain. 20 tablet 0  . loratadine (CLARITIN) 10 MG tablet Take 10 mg by mouth daily.    Marland Kitchen  losartan (COZAAR) 25 MG tablet Take 25 mg by mouth daily.    . metoprolol (LOPRESSOR) 50 MG tablet Take 1 tablet (50 mg total) by mouth 2 (two) times daily. 60 tablet 0  . STOOL SOFTENER 100 MG capsule     . pantoprazole (PROTONIX) 40 MG tablet Take 1 tablet (40 mg total) by mouth 2 (two) times daily. Acid reflux medication to help your esophagus heal. 60 tablet 0   No current facility-administered medications for this visit.      REVIEW OF SYSTEMS (Negative unless checked)  Constitutional: [] Weight loss  [] Fever  [] Chills Cardiac: [] Chest pain   [] Chest pressure   [] Palpitations   [] Shortness of breath when laying flat    [] Shortness of breath at rest   [] Shortness of breath with exertion. Vascular:  [] Pain in legs with walking   [] Pain in legs at rest   [] Pain in legs when laying flat   [] Claudication   [] Pain in feet when walking  [] Pain in feet at rest  [] Pain in feet when laying flat   [x] History of DVT   [x] Phlebitis   [] Swelling in legs   [] Varicose veins   [] Non-healing ulcers Pulmonary:   [] Uses home oxygen   [] Productive cough   [] Hemoptysis   [] Wheeze  [] COPD   [] Asthma Neurologic:  [] Dizziness  [] Blackouts   [] Seizures   [x] History of stroke   [] History of TIA  [] Aphasia   [] Temporary blindness   [] Dysphagia   [x] Weakness or numbness in arms   [] Weakness or numbness in legs Musculoskeletal:  [x] Arthritis   [] Joint swelling   [x] Joint pain   [] Low back pain Hematologic:  [] Easy bruising  [] Easy bleeding   [] Hypercoagulable state   [] Anemic  [] Hepatitis Gastrointestinal:  [x] Blood in stool   [] Vomiting blood  [] Gastroesophageal reflux/heartburn   [] Abdominal pain Genitourinary:  [] Chronic kidney disease   [] Difficult urination  [] Frequent urination  [] Burning with urination   [] Hematuria Skin:  [] Rashes   [] Ulcers   [] Wounds Psychological:  [] History of anxiety   []  History of major depression.    Physical Exam BP 134/66 (BP Location: Left Arm)   Pulse (!) 59   Resp 16   Ht 5\' 11"  (1.803 m)   Wt 150 lb (68 kg)   BMI 20.92 kg/m  Gen:  WD/WN, NAD Head: [place]/AT, No temporalis wasting.  Ear/Nose/Throat: Hearing grossly intact, nares w/o erythema or drainage, oropharynx w/o Erythema/Exudate Eyes: Conjunctiva clear, sclera non-icteric  Neck: trachea midline.  No JVD.  Pulmonary:  Good air movement, respirations not labored, no use of accessory muscles  Cardiac: RRR, no JVD Vascular:  Vessel Right Left  Radial Palpable Palpable                                    Musculoskeletal: Right arm weakness.  Extremities without ischemic changes.  No deformity or atrophy.  Trace bilateral lower  extremity edema. Neurologic: Right arm weakness.  Speech is limited. Motor exam as listed above. Psychiatric: Judgment and insight appeared relatively poor.  Patient is a poor historian. Dermatologic: No rashes or ulcers noted.  No cellulitis or open wounds.    Radiology No results found.  Labs Recent Results (from the past 2160 hour(s))  CBC with Differential     Status: None   Collection Time: 09/02/20  9:38 AM  Result Value Ref Range   WBC 10.3 4.0 - 10.5 K/uL   RBC 4.47 4.22 -  5.81 MIL/uL   Hemoglobin 13.4 13.0 - 17.0 g/dL   HCT 16.140.3 09.639.0 - 04.552.0 %   MCV 90.2 80.0 - 100.0 fL   MCH 30.0 26.0 - 34.0 pg   MCHC 33.3 30.0 - 36.0 g/dL   RDW 40.913.4 81.111.5 - 91.415.5 %   Platelets 267 150 - 400 K/uL   nRBC 0.0 0.0 - 0.2 %   Neutrophils Relative % 75 %   Neutro Abs 7.6 1.7 - 7.7 K/uL   Lymphocytes Relative 15 %   Lymphs Abs 1.6 0.7 - 4.0 K/uL   Monocytes Relative 9 %   Monocytes Absolute 0.9 0.1 - 1.0 K/uL   Eosinophils Relative 1 %   Eosinophils Absolute 0.1 0.0 - 0.5 K/uL   Basophils Relative 0 %   Basophils Absolute 0.0 0.0 - 0.1 K/uL   Immature Granulocytes 0 %   Abs Immature Granulocytes 0.03 0.00 - 0.07 K/uL    Comment: Performed at [facility], [address] [place], [place] [address]  Comprehensive metabolic panel     Status: Abnormal   Collection Time: 09/02/20  9:38 AM  Result Value Ref Range   Sodium 140 135 - 145 mmol/L   Potassium 3.6 3.5 - 5.1 mmol/L   Chloride 107 98 - 111 mmol/L   CO2 21 (L) 22 - 32 mmol/L   Glucose, Bld 106 (H) 70 - 99 mg/dL    Comment: Glucose reference range applies only to samples taken after fasting for at least 8 hours.   BUN 24 (H) 8 - 23 mg/dL   Creatinine, Ser 6.211.41 (H) 0.61 - 1.24 mg/dL   Calcium 9.7 8.9 - 30.810.3 mg/dL   Total Protein 7.0 6.5 - 8.1 g/dL   Albumin 3.8 3.5 - 5.0 g/dL   AST 19 15 - 41 U/L   ALT 14 0 - 44 U/L   Alkaline Phosphatase 89 38 - 126 U/L   Total Bilirubin 1.0 0.3 - 1.2 mg/dL   GFR, Estimated 47 (L)  >60 mL/min    Comment: (NOTE) Calculated using the CKD-EPI Creatinine Equation (2021)    Anion gap 12 5 - 15    Comment: Performed at [facility], [address] [place], [place] [address]  Lipase, blood     Status: None   Collection Time: 09/02/20  9:38 AM  Result Value Ref Range   Lipase 40 11 - 51 U/L    Comment: Performed at [facility], [address] [place], [place] [address]  Troponin I (High Sensitivity)     Status: None   Collection Time: 09/02/20  9:38 AM  Result Value Ref Range   Troponin I (High Sensitivity) 5 <18 ng/L    Comment: (NOTE) Elevated high sensitivity troponin I (hsTnI) values and significant  changes across serial measurements may suggest ACS but many other  chronic and acute conditions are known to elevate hsTnI results.  Refer to the "Links" section for chest pain algorithms and additional  guidance. Performed at [facility], [address] [place], [place] [address]   Resp Panel by RT-PCR (Flu A&B, Covid) Nasopharyngeal Swab     Status: None   Collection Time: 09/02/20  9:38 AM   Specimen: Nasopharyngeal Swab; Nasopharyngeal(NP) swabs in vial transport medium  Result Value Ref Range   SARS Coronavirus 2 by RT PCR NEGATIVE NEGATIVE    Comment: (NOTE) SARS-CoV-2 target nucleic acids are NOT DETECTED.  The SARS-CoV-2 RNA is generally detectable in upper respiratory specimens during the  acute phase of infection. The lowest concentration of SARS-CoV-2 viral copies this assay can detect is 138 copies/mL. A negative result does not preclude SARS-Cov-2 infection and should not be used as the sole basis for treatment or other patient management decisions. A negative result may occur with  improper specimen collection/handling, submission of specimen other than nasopharyngeal swab, presence of viral mutation(s) within the areas targeted by this assay, and inadequate number of viral copies(<138 copies/mL). A negative  result must be combined with clinical observations, patient history, and epidemiological information. The expected result is Negative.  Fact Sheet for Patients:  BloggerCourse.com  Fact Sheet for Healthcare Providers:  SeriousBroker.it  This test is no t yet approved or cleared by the [place] FDA and  has been authorized for detection and/or diagnosis of SARS-CoV-2 by FDA under an Emergency Use Authorization (EUA). This EUA will remain  in effect (meaning this test can be used) for the duration of the COVID-19 declaration under Section 564(b)(1) of the Act, 21 U.S.C.section 360bbb-3(b)(1), unless the authorization is terminated  or revoked sooner.       Influenza A by PCR NEGATIVE NEGATIVE   Influenza B by PCR NEGATIVE NEGATIVE    Comment: (NOTE) The Xpert Xpress SARS-CoV-2/FLU/RSV plus assay is intended as an aid in the diagnosis of influenza from Nasopharyngeal swab specimens and should not be used as a sole basis for treatment. Nasal washings and aspirates are unacceptable for Xpert Xpress SARS-CoV-2/FLU/RSV testing.  Fact Sheet for Patients: BloggerCourse.com  Fact Sheet for Healthcare Providers: SeriousBroker.it  This test is not yet approved or cleared by the [place] FDA and has been authorized for detection and/or diagnosis of SARS-CoV-2 by FDA under an Emergency Use Authorization (EUA). This EUA will remain in effect (meaning this test can be used) for the duration of the COVID-19 declaration under Section 564(b)(1) of the Act, 21 U.S.C. section 360bbb-3(b)(1), unless the authorization is terminated or revoked.  Performed at [facility], [address] [place], [place] [address]   CK     Status: Abnormal   Collection Time: 09/02/20  9:38 AM  Result Value Ref Range   Total CK 39 (L) 49 - 397 U/L    Comment: Performed at [facility], [address] [place], [place] [address]  Brain natriuretic peptide     Status: Abnormal   Collection Time: 09/02/20  9:40 AM  Result Value Ref Range   B Natriuretic Peptide 104.8 (H) 0.0 - 100.0 pg/mL    Comment: Performed at [facility], [address], [place], [place] [address]  Troponin I (High Sensitivity)     Status: None   Collection Time: 09/02/20 11:45 AM  Result Value Ref Range   Troponin I (High Sensitivity) 3 <18 ng/L    Comment: (NOTE) Elevated high sensitivity troponin I (hsTnI) values and significant  changes across serial measurements may suggest ACS but many other  chronic and acute conditions are known to elevate hsTnI results.  Refer to the "Links" section for chest pain algorithms and additional  guidance. Performed at [facility], [address] [place], [place] [address]   Urinalysis, Complete w Microscopic     Status: Abnormal   Collection Time: 09/02/20  3:41 PM  Result Value Ref Range   Color, Urine YELLOW (A) YELLOW   APPearance CLOUDY (A) CLEAR   Specific Gravity, Urine 1.021 1.005 - 1.030   pH 5.0 5.0 - 8.0   Glucose, UA NEGATIVE NEGATIVE mg/dL  Hgb urine dipstick SMALL (A) NEGATIVE   Bilirubin Urine NEGATIVE NEGATIVE   Ketones, ur NEGATIVE NEGATIVE mg/dL   Protein, ur NEGATIVE NEGATIVE mg/dL   Nitrite POSITIVE (A) NEGATIVE   Leukocytes,Ua LARGE (A) NEGATIVE   RBC / HPF 11-20 0 - 5 RBC/hpf   WBC, UA >50 (H) 0 - 5 WBC/hpf   Bacteria, UA MANY (A) NONE SEEN   Squamous Epithelial / LPF NONE SEEN 0 - 5   WBC Clumps PRESENT    Mucus PRESENT     Comment: Performed at [facility], [address] [place], [place] [address]  APTT     Status: Abnormal   Collection Time: 09/02/20  3:41 PM  Result Value Ref Range   aPTT 38 (H) 24 - 36 seconds    Comment:        IF BASELINE aPTT IS ELEVATED, SUGGEST PATIENT RISK ASSESSMENT BE USED TO DETERMINE APPROPRIATE ANTICOAGULANT THERAPY. Performed at [facility], [address] [place], [place] [address]   Protime-INR     Status: None   Collection Time: 09/02/20  3:41 PM  Result Value Ref Range   Prothrombin Time 14.4 11.4 - 15.2 seconds   INR 1.2 0.8 - 1.2    Comment: (NOTE) INR goal varies based on device and disease states. Performed at [facility], [address] [place], [place] [address]   Heparin level (unfractionated)     Status: Abnormal   Collection Time: 09/02/20 11:12 PM  Result Value Ref Range   Heparin Unfractionated 1.26 (H) 0.30 - 0.70 IU/mL    Comment: (NOTE) If heparin results are below expected values, and patient dosage has  been confirmed, suggest follow up testing of antithrombin III levels. Performed at [facility], [address] [place], [place] [address]   Heparin level (unfractionated)     Status: Abnormal   Collection Time: 09/03/20  1:43 AM  Result Value Ref Range   Heparin Unfractionated 1.25 (H) 0.30 - 0.70 IU/mL    Comment: (NOTE) If heparin results are below expected values, and patient dosage has  been confirmed, suggest follow up testing of antithrombin III levels. Performed at [facility], [address] [place], [place] [address]   Basic metabolic panel     Status: Abnormal   Collection Time: 09/03/20  6:26 AM  Result Value Ref Range   Sodium 138 135 - 145 mmol/L   Potassium 4.2 3.5 - 5.1 mmol/L   Chloride 111 98 - 111 mmol/L   CO2 21 (L) 22 - 32 mmol/L   Glucose, Bld 124 (H) 70 - 99 mg/dL    Comment: Glucose reference range applies only to samples taken after fasting for at least 8 hours.   BUN 45 (H) 8 - 23 mg/dL   Creatinine, Ser 1.30 0.61 - 1.24 mg/dL   Calcium 9.2 8.9 - 86.5 mg/dL   GFR, Estimated 56 (L) >60 mL/min    Comment: (NOTE) Calculated using the CKD-EPI Creatinine Equation (2021)    Anion gap 6 5 - 15    Comment: Performed at [facility], [address] [place], [place] [address]  CBC     Status: Abnormal   Collection  Time: 09/03/20  6:26 AM  Result Value Ref Range   WBC 14.2 (H) 4.0 - 10.5 K/uL   RBC 3.81 (L) 4.22 - 5.81 MIL/uL   Hemoglobin 11.4 (L) 13.0 - 17.0 g/dL   HCT 62.9 (L) 52.8 - 41.3 %  MCV 91.9 80.0 - 100.0 fL   MCH 29.9 26.0 - 34.0 pg   MCHC 32.6 30.0 - 36.0 g/dL   RDW 23.5 36.1 - 44.3 %   Platelets 240 150 - 400 K/uL   nRBC 0.0 0.0 - 0.2 %    Comment: Performed at [facility], [address] [place], [place] [address]  Glucose, capillary     Status: Abnormal   Collection Time: 09/03/20  6:39 AM  Result Value Ref Range   Glucose-Capillary 126 (H) 70 - 99 mg/dL    Comment: Glucose reference range applies only to samples taken after fasting for at least 8 hours.  Hemoglobin and hematocrit, blood     Status: Abnormal   Collection Time: 09/03/20 11:14 AM  Result Value Ref Range   Hemoglobin 11.0 (L) 13.0 - 17.0 g/dL   HCT 86.7 (L) 61.9 - 50.9 %    Comment: Performed at [facility], [address] [place], [place] [address]  Basic metabolic panel     Status: Abnormal   Collection Time: 09/04/20  4:00 AM  Result Value Ref Range   Sodium 139 135 - 145 mmol/L   Potassium 3.6 3.5 - 5.1 mmol/L   Chloride 110 98 - 111 mmol/L   CO2 22 22 - 32 mmol/L   Glucose, Bld 112 (H) 70 - 99 mg/dL    Comment: Glucose reference range applies only to samples taken after fasting for at least 8 hours.   BUN 51 (H) 8 - 23 mg/dL   Creatinine, Ser 2.45 (H) 0.61 - 1.24 mg/dL   Calcium 8.6 (L) 8.9 - 10.3 mg/dL   GFR, Estimated 54 (L) >60 mL/min    Comment: (NOTE) Calculated using the CKD-EPI Creatinine Equation (2021)    Anion gap 7 5 - 15    Comment: Performed at [facility], [address] [place], [place] [address]  CBC     Status: Abnormal   Collection Time: 09/04/20  4:00 AM  Result Value Ref Range   WBC 13.4 (H) 4.0 - 10.5 K/uL   RBC 3.15 (L) 4.22 - 5.81 MIL/uL   Hemoglobin 9.3 (L) 13.0 - 17.0 g/dL   HCT 33.8 (L) 25.0 - 53.9 %   MCV 94.0 80.0 - 100.0 fL   MCH  29.5 26.0 - 34.0 pg   MCHC 31.4 30.0 - 36.0 g/dL   RDW 76.7 34.1 - 93.7 %   Platelets 234 150 - 400 K/uL   nRBC 0.0 0.0 - 0.2 %    Comment: Performed at [facility], [address], [place], [place] [address]  Magnesium     Status: None   Collection Time: 09/04/20  4:00 AM  Result Value Ref Range   Magnesium 2.4 1.7 - 2.4 mg/dL    Comment: Performed at [facility], [address], [place], [place] [address]  Basic metabolic panel     Status: Abnormal   Collection Time: 09/05/20  5:29 AM  Result Value Ref Range   Sodium 140 135 - 145 mmol/L   Potassium 3.6 3.5 - 5.1 mmol/L   Chloride 112 (H) 98 - 111 mmol/L   CO2 22 22 - 32 mmol/L   Glucose, Bld 96 70 - 99 mg/dL    Comment: Glucose reference range applies only to samples taken after fasting for at least 8 hours.   BUN 40 (H) 8 - 23 mg/dL   Creatinine, Ser 2.99 (H) 0.61 - 1.24 mg/dL   Calcium 8.4 (  L) 8.9 - 10.3 mg/dL   GFR, Estimated 52 (L) >60 mL/min    Comment: (NOTE) Calculated using the CKD-EPI Creatinine Equation (2021)    Anion gap 6 5 - 15    Comment: Performed at [facility], [address] [place], [place] [address]  CBC     Status: Abnormal   Collection Time: 09/05/20  5:29 AM  Result Value Ref Range   WBC 8.4 4.0 - 10.5 K/uL   RBC 2.80 (L) 4.22 - 5.81 MIL/uL   Hemoglobin 8.5 (L) 13.0 - 17.0 g/dL   HCT 60.4 (L) 54.0 - 98.1 %   MCV 93.9 80.0 - 100.0 fL   MCH 30.4 26.0 - 34.0 pg   MCHC 32.3 30.0 - 36.0 g/dL   RDW 19.1 47.8 - 29.5 %   Platelets 205 150 - 400 K/uL   nRBC 0.0 0.0 - 0.2 %    Comment: Performed at [facility], [address] [place], [place] [address]  Magnesium     Status: None   Collection Time: 09/05/20  5:29 AM  Result Value Ref Range   Magnesium 2.3 1.7 - 2.4 mg/dL    Comment: Performed at [facility], [address], [place], [place] [address]  Basic metabolic panel     Status: Abnormal   Collection Time: 09/06/20  7:09 AM  Result Value  Ref Range   Sodium 139 135 - 145 mmol/L   Potassium 3.5 3.5 - 5.1 mmol/L   Chloride 113 (H) 98 - 111 mmol/L   CO2 22 22 - 32 mmol/L   Glucose, Bld 99 70 - 99 mg/dL    Comment: Glucose reference range applies only to samples taken after fasting for at least 8 hours.   BUN 37 (H) 8 - 23 mg/dL   Creatinine, Ser 4.69 (H) 0.61 - 1.24 mg/dL   Calcium 8.3 (L) 8.9 - 10.3 mg/dL   GFR, Estimated 48 (L) >60 mL/min    Comment: (NOTE) Calculated using the CKD-EPI Creatinine Equation (2021)    Anion gap 4 (L) 5 - 15    Comment: Performed at [facility], [address] [place], [place] [address]  CBC     Status: Abnormal   Collection Time: 09/06/20  7:09 AM  Result Value Ref Range   WBC 8.2 4.0 - 10.5 K/uL   RBC 2.70 (L) 4.22 - 5.81 MIL/uL   Hemoglobin 8.1 (L) 13.0 - 17.0 g/dL   HCT 84.1 (L) 32.4 - 40.1 %   MCV 94.1 80.0 - 100.0 fL   MCH 30.0 26.0 - 34.0 pg   MCHC 31.9 30.0 - 36.0 g/dL   RDW 02.7 25.3 - 66.4 %   Platelets 202 150 - 400 K/uL   nRBC 0.0 0.0 - 0.2 %    Comment: Performed at [facility], [address], [place], [place] [address]  Magnesium     Status: None   Collection Time: 09/06/20  7:09 AM  Result Value Ref Range   Magnesium 2.1 1.7 - 2.4 mg/dL    Comment: Performed at [facility], [address] [place], [place] [address]  CBC     Status: Abnormal   Collection Time: 09/08/20 12:35 PM  Result Value Ref Range   WBC 8.6 4.0 - 10.5 K/uL   RBC 3.12 (L) 4.22 - 5.81 MIL/uL   Hemoglobin 9.3 (L) 13.0 - 17.0 g/dL   HCT 63.8 (L) 75.6 - 43.3 %   MCV 91.3 80.0 -  100.0 fL   MCH 29.8 26.0 - 34.0 pg   MCHC 32.6 30.0 - 36.0 g/dL   RDW 96.2 95.2 - 84.1 %   Platelets 295 150 - 400 K/uL   nRBC 0.0 0.0 - 0.2 %    Comment: Performed at [facility], [address] [place], [place] [address]  Comprehensive metabolic panel     Status: Abnormal   Collection Time: 09/08/20 12:35 PM  Result Value Ref Range   Sodium 138 135 - 145 mmol/L    Potassium 3.7 3.5 - 5.1 mmol/L   Chloride 108 98 - 111 mmol/L   CO2 23 22 - 32 mmol/L   Glucose, Bld 92 70 - 99 mg/dL    Comment: Glucose reference range applies only to samples taken after fasting for at least 8 hours.   BUN 26 (H) 8 - 23 mg/dL   Creatinine, Ser 1.02 (H) 0.61 - 1.24 mg/dL   Calcium 9.2 8.9 - 72.5 mg/dL   Total Protein 6.2 (L) 6.5 - 8.1 g/dL   Albumin 3.3 (L) 3.5 - 5.0 g/dL   AST 24 15 - 41 U/L   ALT 27 0 - 44 U/L   Alkaline Phosphatase 70 38 - 126 U/L   Total Bilirubin 0.6 0.3 - 1.2 mg/dL   GFR, Estimated 52 (L) >60 mL/min    Comment: (NOTE) Calculated using the CKD-EPI Creatinine Equation (2021)    Anion gap 7 5 - 15    Comment: Performed at [facility], [address] [place], [place] [address]  Protime-INR     Status: None   Collection Time: 09/08/20 12:35 PM  Result Value Ref Range   Prothrombin Time 13.5 11.4 - 15.2 seconds   INR 1.1 0.8 - 1.2    Comment: (NOTE) INR goal varies based on device and disease states. Performed at [facility], [address] [place], [place] [address]     Assessment/Plan:  Right femoral vein DVT [facility]) The patient had an IVC filter placed for right femoral vein DVT with GI bleed about 2 months ago by covering physician.  He has done fine.  His legs have no pain or swelling.  Given his advanced age, limited mobility, and the fact that he will not be on anticoagulation I would leave this in indefinitely for him.  Certainly should stay in for at least 3 to 6 months.  The morbidity associated with a filter in a patient with his age and comorbidities is quite small, and unless he has significant pain or swelling in his legs that would suspect thrombosis, I will just plan to see him as needed.  Hypertension blood pressure control important in reducing the progression of atherosclerotic disease. On appropriate oral medications.   Acute CVA (cerebrovascular accident) (HCC) Seems to have residual deficits and  right arm weakness and contracture.  History of GI bleed This is the reason for filter placement.  Appears to be improved.      Festus Barren 11/09/2020, 3:16 PM   This note was created with Dragon medical transcription system.  Any errors from dictation are unintentional.

## 2020-11-09 NOTE — Assessment & Plan Note (Signed)
The patient had an IVC filter placed for right femoral vein DVT with GI bleed about 2 months ago by covering physician.  He has done fine.  His legs have no pain or swelling.  Given his advanced age, limited mobility, and the fact that he will not be on anticoagulation I would leave this in indefinitely for him.  Certainly should stay in for at least 3 to 6 months.  The morbidity associated with a filter in a patient with his age and comorbidities is quite small, and unless he has significant pain or swelling in his legs that would suspect thrombosis, I will just plan to see him as needed.

## 2020-11-09 NOTE — Assessment & Plan Note (Signed)
This is the reason for filter placement.  Appears to be improved.

## 2020-11-09 NOTE — Assessment & Plan Note (Signed)
Seems to have residual deficits and right arm weakness and contracture.

## 2020-11-09 NOTE — Assessment & Plan Note (Signed)
blood pressure control important in reducing the progression of atherosclerotic disease. On appropriate oral medications.  

## 2020-12-01 ENCOUNTER — Observation Stay: Payer: Medicare Other

## 2020-12-01 ENCOUNTER — Inpatient Hospital Stay
Admission: EM | Admit: 2020-12-01 | Discharge: 2020-12-04 | DRG: 872 | Disposition: A | Discharge status: Home Health | Payer: Medicare Other | Attending: Internal Medicine | Admitting: Internal Medicine

## 2020-12-01 ENCOUNTER — Other Ambulatory Visit: Payer: Self-pay

## 2020-12-01 ENCOUNTER — Emergency Department: Payer: Medicare Other

## 2020-12-01 DIAGNOSIS — H9190 Unspecified hearing loss, unspecified ear: Secondary | ICD-10-CM | POA: Diagnosis present

## 2020-12-01 DIAGNOSIS — Z7902 Long term (current) use of antithrombotics/antiplatelets: Secondary | ICD-10-CM

## 2020-12-01 DIAGNOSIS — I4891 Unspecified atrial fibrillation: Secondary | ICD-10-CM | POA: Diagnosis not present

## 2020-12-01 DIAGNOSIS — K219 Gastro-esophageal reflux disease without esophagitis: Secondary | ICD-10-CM | POA: Diagnosis present

## 2020-12-01 DIAGNOSIS — N1831 Chronic kidney disease, stage 3a: Secondary | ICD-10-CM | POA: Diagnosis present

## 2020-12-01 DIAGNOSIS — I2510 Atherosclerotic heart disease of native coronary artery without angina pectoris: Secondary | ICD-10-CM | POA: Diagnosis present

## 2020-12-01 DIAGNOSIS — R338 Other retention of urine: Secondary | ICD-10-CM | POA: Diagnosis present

## 2020-12-01 DIAGNOSIS — Z7982 Long term (current) use of aspirin: Secondary | ICD-10-CM

## 2020-12-01 DIAGNOSIS — Z66 Do not resuscitate: Secondary | ICD-10-CM | POA: Diagnosis present

## 2020-12-01 DIAGNOSIS — N401 Enlarged prostate with lower urinary tract symptoms: Secondary | ICD-10-CM | POA: Diagnosis present

## 2020-12-01 DIAGNOSIS — I739 Peripheral vascular disease, unspecified: Secondary | ICD-10-CM | POA: Diagnosis present

## 2020-12-01 DIAGNOSIS — E7800 Pure hypercholesterolemia, unspecified: Secondary | ICD-10-CM | POA: Diagnosis present

## 2020-12-01 DIAGNOSIS — Z95828 Presence of other vascular implants and grafts: Secondary | ICD-10-CM

## 2020-12-01 DIAGNOSIS — Z20822 Contact with and (suspected) exposure to covid-19: Secondary | ICD-10-CM | POA: Diagnosis present

## 2020-12-01 DIAGNOSIS — H9193 Unspecified hearing loss, bilateral: Secondary | ICD-10-CM | POA: Diagnosis present

## 2020-12-01 DIAGNOSIS — K029 Dental caries, unspecified: Secondary | ICD-10-CM | POA: Diagnosis present

## 2020-12-01 DIAGNOSIS — L0390 Cellulitis, unspecified: Secondary | ICD-10-CM | POA: Diagnosis present

## 2020-12-01 DIAGNOSIS — I129 Hypertensive chronic kidney disease with stage 1 through stage 4 chronic kidney disease, or unspecified chronic kidney disease: Secondary | ICD-10-CM | POA: Diagnosis present

## 2020-12-01 DIAGNOSIS — R531 Weakness: Secondary | ICD-10-CM

## 2020-12-01 DIAGNOSIS — I69351 Hemiplegia and hemiparesis following cerebral infarction affecting right dominant side: Secondary | ICD-10-CM

## 2020-12-01 DIAGNOSIS — Z87442 Personal history of urinary calculi: Secondary | ICD-10-CM

## 2020-12-01 DIAGNOSIS — I480 Paroxysmal atrial fibrillation: Secondary | ICD-10-CM | POA: Diagnosis present

## 2020-12-01 DIAGNOSIS — Z87891 Personal history of nicotine dependence: Secondary | ICD-10-CM

## 2020-12-01 DIAGNOSIS — N179 Acute kidney failure, unspecified: Secondary | ICD-10-CM | POA: Diagnosis present

## 2020-12-01 DIAGNOSIS — I639 Cerebral infarction, unspecified: Secondary | ICD-10-CM | POA: Diagnosis present

## 2020-12-01 DIAGNOSIS — Z8673 Personal history of transient ischemic attack (TIA), and cerebral infarction without residual deficits: Secondary | ICD-10-CM

## 2020-12-01 DIAGNOSIS — Z9181 History of falling: Secondary | ICD-10-CM

## 2020-12-01 DIAGNOSIS — K122 Cellulitis and abscess of mouth: Secondary | ICD-10-CM | POA: Diagnosis present

## 2020-12-01 DIAGNOSIS — I10 Essential (primary) hypertension: Secondary | ICD-10-CM | POA: Diagnosis present

## 2020-12-01 DIAGNOSIS — Z881 Allergy status to other antibiotic agents status: Secondary | ICD-10-CM

## 2020-12-01 DIAGNOSIS — A419 Sepsis, unspecified organism: Secondary | ICD-10-CM | POA: Diagnosis not present

## 2020-12-01 DIAGNOSIS — K047 Periapical abscess without sinus: Secondary | ICD-10-CM

## 2020-12-01 DIAGNOSIS — Z888 Allergy status to other drugs, medicaments and biological substances status: Secondary | ICD-10-CM

## 2020-12-01 DIAGNOSIS — Z79899 Other long term (current) drug therapy: Secondary | ICD-10-CM

## 2020-12-01 DIAGNOSIS — R0602 Shortness of breath: Secondary | ICD-10-CM | POA: Diagnosis not present

## 2020-12-01 DIAGNOSIS — Z86718 Personal history of other venous thrombosis and embolism: Secondary | ICD-10-CM

## 2020-12-01 LAB — RESP PANEL BY RT-PCR (FLU A&B, COVID) ARPGX2
Influenza A by PCR: NEGATIVE
Influenza B by PCR: NEGATIVE
SARS Coronavirus 2 by RT PCR: NEGATIVE

## 2020-12-01 LAB — URINALYSIS, COMPLETE (UACMP) WITH MICROSCOPIC
Bilirubin Urine: NEGATIVE
Glucose, UA: NEGATIVE mg/dL
Ketones, ur: NEGATIVE mg/dL
Nitrite: POSITIVE — AB
Protein, ur: 30 mg/dL — AB
Specific Gravity, Urine: 1.025 (ref 1.005–1.030)
WBC, UA: >50 WBC/hpf — ABNORMAL HIGH (ref 0–5)
pH: 6 (ref 5.0–8.0)

## 2020-12-01 LAB — CBC
HCT: 33.4 % — ABNORMAL LOW (ref 39.0–52.0)
Hemoglobin: 10.5 g/dL — ABNORMAL LOW (ref 13.0–17.0)
MCH: 26.8 pg (ref 26.0–34.0)
MCHC: 31.4 g/dL (ref 30.0–36.0)
MCV: 85.2 fL (ref 80.0–100.0)
Platelets: 320 10*3/uL (ref 150–400)
RBC: 3.92 MIL/uL — ABNORMAL LOW (ref 4.22–5.81)
RDW: 14.7 % (ref 11.5–15.5)
WBC: 22 10*3/uL — ABNORMAL HIGH (ref 4.0–10.5)
nRBC: 0 % (ref 0.0–0.2)

## 2020-12-01 LAB — PROCALCITONIN: Procalcitonin: <0.1 ng/mL

## 2020-12-01 LAB — BASIC METABOLIC PANEL
Anion gap: 6 (ref 5–15)
BUN: 26 mg/dL — ABNORMAL HIGH (ref 8–23)
CO2: 23 mmol/L (ref 22–32)
Calcium: 9.2 mg/dL (ref 8.9–10.3)
Chloride: 110 mmol/L (ref 98–111)
Creatinine, Ser: 1.45 mg/dL — ABNORMAL HIGH (ref 0.61–1.24)
GFR, Estimated: 45 mL/min — ABNORMAL LOW (ref >60)
Glucose, Bld: 161 mg/dL — ABNORMAL HIGH (ref 70–99)
Potassium: 3.7 mmol/L (ref 3.5–5.1)
Sodium: 139 mmol/L (ref 135–145)

## 2020-12-01 LAB — LACTIC ACID, PLASMA: Lactic Acid, Venous: 1.5 mmol/L (ref 0.5–1.9)

## 2020-12-01 LAB — TSH: TSH: 1.508 u[IU]/mL (ref 0.350–4.500)

## 2020-12-01 LAB — T4, FREE: Free T4: 1.05 ng/dL (ref 0.61–1.12)

## 2020-12-01 MED ORDER — IOHEXOL 300 MG/ML  SOLN
75.0000 mL | Freq: Once | INTRAMUSCULAR | Status: AC | PRN
  Administered 2020-12-01: 60 mL via INTRAVENOUS

## 2020-12-01 MED ORDER — ONDANSETRON HCL 4 MG/2ML IJ SOLN: 4.0000 mg | Freq: Four times a day (QID) | INTRAMUSCULAR | Status: DC | PRN

## 2020-12-01 MED ORDER — HYDROCODONE-ACETAMINOPHEN 5-325 MG PO TABS: 1.0000 | ORAL_TABLET | Freq: Four times a day (QID) | ORAL | Status: DC | PRN

## 2020-12-01 MED ORDER — ASPIRIN EC 81 MG PO TBEC
81.0000 mg | DELAYED_RELEASE_TABLET | Freq: Every day | ORAL | Status: DC
  Administered 2020-12-02 – 2020-12-04 (×3): 81 mg via ORAL
  Filled 2020-12-01 (×3): qty 1

## 2020-12-01 MED ORDER — FAMOTIDINE 20 MG PO TABS
40.0000 mg | ORAL_TABLET | Freq: Two times a day (BID) | ORAL | Status: DC
  Administered 2020-12-02 – 2020-12-04 (×5): 40 mg via ORAL
  Filled 2020-12-01 (×6): qty 2

## 2020-12-01 MED ORDER — PANTOPRAZOLE SODIUM 40 MG PO TBEC
40.0000 mg | DELAYED_RELEASE_TABLET | Freq: Two times a day (BID) | ORAL | Status: DC
  Administered 2020-12-02 – 2020-12-04 (×5): 40 mg via ORAL
  Filled 2020-12-01 (×6): qty 1

## 2020-12-01 MED ORDER — ACETAMINOPHEN 650 MG RE SUPP
650.0000 mg | Freq: Four times a day (QID) | RECTAL | Status: DC | PRN
  Filled 2020-12-01: qty 1

## 2020-12-01 MED ORDER — ATORVASTATIN CALCIUM 10 MG PO TABS
10.0000 mg | ORAL_TABLET | Freq: Every evening | ORAL | Status: DC
  Administered 2020-12-02 – 2020-12-03 (×2): 10 mg via ORAL
  Filled 2020-12-01 (×3): qty 1

## 2020-12-01 MED ORDER — SODIUM CHLORIDE 0.9 % IV SOLN
3.0000 g | Freq: Four times a day (QID) | INTRAVENOUS | Status: DC
  Administered 2020-12-02 – 2020-12-03 (×5): 3 g via INTRAVENOUS
  Filled 2020-12-01 (×8): qty 8
  Filled 2020-12-01: qty 3

## 2020-12-01 MED ORDER — ENOXAPARIN SODIUM 40 MG/0.4ML IJ SOSY: 40.0000 mg | PREFILLED_SYRINGE | INTRAMUSCULAR | Status: DC

## 2020-12-01 MED ORDER — ONDANSETRON HCL 4 MG PO TABS: 4.0000 mg | ORAL_TABLET | Freq: Four times a day (QID) | ORAL | Status: DC | PRN

## 2020-12-01 MED ORDER — SODIUM CHLORIDE 0.9 % IV BOLUS
1000.0000 mL | Freq: Once | INTRAVENOUS | Status: AC
  Administered 2020-12-01: 1000 mL via INTRAVENOUS

## 2020-12-01 MED ORDER — LORATADINE 10 MG PO TABS
10.0000 mg | ORAL_TABLET | Freq: Every day | ORAL | Status: DC
  Administered 2020-12-02 – 2020-12-04 (×3): 10 mg via ORAL
  Filled 2020-12-01 (×3): qty 1

## 2020-12-01 MED ORDER — CLOPIDOGREL BISULFATE 75 MG PO TABS
75.0000 mg | ORAL_TABLET | Freq: Every day | ORAL | Status: DC
  Administered 2020-12-02 – 2020-12-04 (×3): 75 mg via ORAL
  Filled 2020-12-01 (×3): qty 1

## 2020-12-01 MED ORDER — LOSARTAN POTASSIUM 25 MG PO TABS
25.0000 mg | ORAL_TABLET | Freq: Every day | ORAL | Status: DC
  Administered 2020-12-02: 25 mg via ORAL
  Filled 2020-12-01: qty 1

## 2020-12-01 MED ORDER — METOPROLOL TARTRATE 50 MG PO TABS
50.0000 mg | ORAL_TABLET | Freq: Two times a day (BID) | ORAL | Status: DC
  Filled 2020-12-01: qty 1

## 2020-12-01 MED ORDER — CLINDAMYCIN PHOSPHATE 600 MG/50ML IV SOLN
600.0000 mg | Freq: Once | INTRAVENOUS | Status: AC
  Administered 2020-12-01: 600 mg via INTRAVENOUS
  Filled 2020-12-01 (×2): qty 50

## 2020-12-01 MED ORDER — SODIUM CHLORIDE 0.9 % IV SOLN
1.0000 g | INTRAVENOUS | Status: DC
  Administered 2020-12-01: 1 g via INTRAVENOUS
  Filled 2020-12-01: qty 10

## 2020-12-01 MED ORDER — ACETAMINOPHEN 325 MG PO TABS: 650.0000 mg | ORAL_TABLET | Freq: Four times a day (QID) | ORAL | Status: DC | PRN

## 2020-12-01 MED ORDER — AMLODIPINE BESYLATE 5 MG PO TABS: 5.0000 mg | ORAL_TABLET | Freq: Every day | ORAL | Status: DC

## 2020-12-01 NOTE — ED Notes (Signed)
Pt to ED with partner c/o fast HR this morning around 1100, in 120s and with SOB and dizziness. HR currently 68 and slightly irregular. No dizziness, SOB or CP at this time.   Stroke 2 years ago, IVC filter R leg. R side: arm and leg: motor weakness with notable contracture in R arm.

## 2020-12-01 NOTE — H&P (Signed)
History and Physical   Matthew Greer FGH:[id number] DOB: 1926/02/25 DOA: 12/01/2020  PCP: [facility]  Outpatient Specialists: Dr. Holley Raring, nephrology Patient coming from: Home via EMS  I have personally briefly reviewed patient's old medical records in [place].  Chief Concern: Generalized weakness  HPI: Matthew Greer is a 87 y.o. male with medical history significant for prior CVA, with right-sided hemiparesis, hypertension, peripheral vascular disease, right femoral DVT, hyperlipidemia, cholelithiasis, CAD, debility, BPH, presents to the emergency department from home via EMS for chief concerns of generalized weakness.  Per chart review patient was brought in to the emergency department due to elevated heart rate in the 120s with shortness of breath and dizziness.  At bedside patient states he is not hurting anywhere.  He states that he has left upper extremity contractures due to a stroke 2 years ago.  He does not know why he was brought to the emergency department.  Patient is not sure why he is lost his front teeth.  He does not know whether he is fallen recently.  He was not able to tell me his age, or the current calendar year.  He knows his name and that he is in the hospital.  Social history: Patient lives at home with his partner.  Patient is in a same-sex relationship.  He denies tobacco, EtOH, recreational drug use.  Vaccination history: he is vaccinated for covid 19, two doses  ROS: Unable to complete due to moderate to advanced dementia Patient denies chest pain, shortness of breath, abdominal pain.  Patient states he does not know why he is here today  ED Course: Discussed with ED provider, patient requiring hospitalization for meeting sepsis criteria and atrial fibrillation with RVR.  Vitals in the emergency department was noted for temperature of 98.3, respiration rate initially of 18 and increased to 26, heart rate 106, blood pressure 133/74, SPO2  of 98% on room air.  Patient is status post clindamycin IV, normal saline 1 L bolus,  Assessment/Plan  Active Problems:   Benign essential hypertension   Hearing loss   Pure hypercholesterolemia   CVA (cerebral vascular accident) ([place])   AKI (acute kidney injury) ([place])   Hearing impaired person, bilateral   Weakness   Benign prostatic hyperplasia with urinary retention   History of stroke   Hypertension   Stage 3a chronic kidney disease (HCC)   Cellulitis   Paroxysmal A-fib (HCC)   Atrial fibrillation with RVR ([place])   Sepsis ([place])   # Atrial fibrillation with RVR-suspect secondary to dental caries infection # Patient met sepsis criteria with elevated respiration rate, heart rate, leukocytosis of 22, and source is possible right paramidline mandible cellulitis - Per CT of the maxillofacial, no discrete drainable fluid collection - Scattered periapical lucencies and dental caries involving mandibular and maxillary teeth - Status post clindamycin 600 mg IV per ED provider - Unasyn per pharmacy with instructions for dosing at 3 g every 6 hours IV - Blood cultures x2 ordered - Last EKG converted to sinus rhythm - Patient is status post normal saline 1 L bolus - Patient maintaining appropriate MAP, no indication for IVF at this time  # Hypertension-metoprolol tartrate 50 mg twice daily, losartan 25 mg daily, amlodipine 5 mg daily resumed  # Hyperlipidemia-atorvastatin 10 mg nightly resumed  # GERD-for pantoprazole 40 mg twice daily  # PAD-Plavix 75 mg daily, aspirin 81 mg daily resumed  # History of right femoral DVT-status post IVC filter placed on  09/04/2020  Chart reviewed.   Echo on 04/10/2021: Ejection fraction was read as 60 to 65%, no left ventricular hypertrophy.  Left ventricular diastolic parameters are consistent with grade 1 diastolic dysfunction.  DVT prophylaxis: IVC filter placed on 09/04/2020 Code Status: Full code, previously patient DNR however today he states  he does not know Diet: Heart healthy Family Communication: No attempted to call spouse/partner at [phone], no pickup Disposition Plan: Pending clinical course Consults called: None at this time Admission status: Observation, MedSurg, telemetry for 24 hours ordered  Past Medical History:  Diagnosis Date   Hypertension    Stroke [facility]) 2013   "minor" - no deficits   Past Surgical History:  Procedure Laterality Date   ESOPHAGEAL DILATION  02/24/2016   Procedure: ESOPHAGEAL DILATION;  Surgeon: Lucilla Lame, MD;  Location: [facility];  Service: Endoscopy;;   ESOPHAGOGASTRODUODENOSCOPY N/A 09/03/2020   Procedure: ESOPHAGOGASTRODUODENOSCOPY (EGD);  Surgeon: Toledo, Benay Pike, MD;  Location: [facility] END[facility];  Service: Gastroenterology;  Laterality: N/A;   ESOPHAGOGASTRODUODENOSCOPY (EGD) WITH PROPOFOL N/A 02/24/2016   Procedure: ESOPHAGOGASTRODUODENOSCOPY (EGD) WITH PROPOFOL;  Surgeon: Lucilla Lame, MD;  Location: [place];  Service: Endoscopy;  Laterality: N/A;   ESOPHAGOGASTRODUODENOSCOPY ENDOSCOPY  03/2013   Dr. Allen Norris   FLEXIBLE SIGMOIDOSCOPY N/A 09/03/2020   Procedure: FLEXIBLE SIGMOIDOSCOPY;  Surgeon: Toledo, Benay Pike, MD;  Location: [facility] ENDOSCOPY;  Service: Gastroenterology;  Laterality: N/A;   IVC FILTER INSERTION N/A 09/04/2020   Procedure: IVC FILTER INSERTION;  Surgeon: Serafina Mitchell, MD;  Location: [facility];  Service: Cardiovascular;  Laterality: N/A;   THORACOTOMY  1953   Social History:  reports that he has quit smoking. He has never used smokeless tobacco. He reports that he does not drink alcohol and does not use drugs.  Allergies  Allergen Reactions   Cephalexin Other (See Comments)    weakness   Chlorhexidine    Finasteride Nausea Only and Other (See Comments)    Reaction:  Confusion    Levofloxacin Other (See Comments)   Lisinopril Other (See Comments)    Reaction:  Confusion   History reviewed. No pertinent family history. Family  history: Family history reviewed and not pertinent  Prior to Admission medications   Medication Sig Start Date End Date Taking? Authorizing Provider  acetaminophen (TYLENOL) 325 MG tablet Take 325-650 mg by mouth every 6 (six) hours as needed for mild pain, moderate pain, fever or headache.     [provider]  amLODipine (NORVASC) 5 MG tablet Take 1 tablet (5 mg total) by mouth daily. 04/13/19   Fritzi Mandes, MD  aspirin EC 81 MG EC tablet Take 1 tablet (81 mg total) by mouth daily. 04/13/19   Fritzi Mandes, MD  atorvastatin (LIPITOR) 10 MG tablet Take 10 mg by mouth every evening.     [provider]  clopidogrel (PLAVIX) 75 MG tablet Take 75 mg by mouth daily.    [provider]  famotidine (PEPCID) 40 MG tablet Take 40 mg by mouth 2 (two) times daily. 09/15/20   [provider]  HYDROcodone-acetaminophen (NORCO/VICODIN) 5-325 MG tablet Take 1 tablet by mouth every 6 (six) hours as needed for severe pain. 09/08/20   Lavonia Drafts, MD  loratadine (CLARITIN) 10 MG tablet Take 10 mg by mouth daily.    [provider]  losartan (COZAAR) 25 MG tablet Take 25 mg by mouth daily. 09/15/19   [provider]  metoprolol (LOPRESSOR) 50 MG tablet Take 1 tablet (50 mg total)  by mouth 2 (two) times daily. 05/28/16   Bettey Costa, MD  pantoprazole (PROTONIX) 40 MG tablet Take 1 tablet (40 mg total) by mouth 2 (two) times daily. Acid reflux medication to help your esophagus heal. 09/06/20 10/06/20  Enzo Bi, MD  STOOL SOFTENER 100 MG capsule  04/22/20   [provider]   Physical Exam: Vitals:   12/01/20 1600 12/01/20 1630 12/01/20 1700 12/01/20 1730  BP: (!) 154/64 134/64 139/75 (!) 154/79  Pulse: 79 77 75 78  Resp: 16  (!) 26 (!) 25  Temp:      TempSrc:      SpO2: 100% 100% 100% 100%  Weight:      Height:       Constitutional: appears age-appropriate, NAD, calm, comfortable Eyes: PERRL, lids and conjunctivae normal ENMT: Mucous membranes are  moist. Posterior pharynx clear of any exudate or lesions. Age-appropriate dentition.  Severe hearing loss Neck: normal, supple, no masses, no thyromegaly Respiratory: clear to auscultation bilaterally, no wheezing, no crackles. Normal respiratory effort. No accessory muscle use.  Cardiovascular: Regular rate and rhythm, no murmurs / rubs / gallops. No extremity edema. 2+ pedal pulses. No carotid bruits.  Abdomen: no tenderness, no masses palpated, no hepatosplenomegaly. Bowel sounds positive.  Musculoskeletal: no clubbing / cyanosis. No joint deformity upper and lower extremities. Good ROM, + left upper extremity contractures, no atrophy.  Decreased muscle tone.  Skin: no rashes, lesions, ulcers. No induration Neurologic: Sensation intact. Strength 5/5 in all right upper and lower.  Decreased strength in left upper and lower extremities. Psychiatric: Normal judgment and insight. Alert and oriented x self. Normal mood.   EKG: - At 1701 on 12/01/2020, independently reviewed, showing sinus rhythm with rate of 76, QTc 411  Chest x-ray on Admission: I personally reviewed and I agree with radiologist reading as below.  CT Maxillofacial W Contrast  Result Date: 12/01/2020 CLINICAL DATA:  Tooth trauma.  Leukocytosis. EXAM: CT MAXILLOFACIAL WITH CONTRAST TECHNIQUE: Multidetector CT imaging of the maxillofacial structures was performed with intravenous contrast. Multiplanar CT image reconstructions were also generated. CONTRAST:  52m OMNIPAQUE IOHEXOL 300 MG/ML  SOLN COMPARISON:  None. FINDINGS: Osseous: Small lucency through the anterior maxillary spine, suggestive of an age-indeterminant nondisplaced fracture. No additional fractures identified; however surrounding streak artifact from dental amalgam limits evaluation. No mandibular dislocation. No destructive process. Severe right TMJ degenerative change. Orbits: Negative. No traumatic or inflammatory finding. Sinuses: Mild mucosal thickening of the  inferior left maxillary sinus and scattered ethmoid air cells. No air-fluid levels. Soft tissues: Edema and soft tissue thickening along the right paramidline mandible anteriorly. No discrete drainable fluid collection identified; however, extensive streak artifact limits evaluation. Scattered periapical lucencies and dental caries involving the mandibular and maxillary teeth, including bilateral medial maxillary incisors and the left medial and lateral incisors. Limited intracranial: No significant or unexpected finding on limited evaluation. IMPRESSION: 1. Edema and soft tissue thickening along the right paramidline mandible anteriorly, suspicious for cellulitis/phlegmon in this patient with leukocytosis. No discrete drainable fluid collection identified; however, extensive streak artifact limits evaluation. 2. Small lucency through the anterior maxillary spine, suggestive of an age-indeterminant nondisplaced fracture. 3. Scattered periapical lucencies and dental caries involving the mandibular and maxillary teeth, including bilateral medial maxillary incisors and the left medial and lateral incisors. Findings discussed with Dr. FNickolas MadridVia telephone at 3:35 PM Electronically Signed   By: FMargaretha SheffieldMD   On: 12/01/2020 15:49   DG Chest Port 1 View  Result Date: 12/01/2020 CLINICAL  DATA:  87 year old male with sepsis. EXAM: PORTABLE CHEST 1 VIEW COMPARISON:  Chest radiograph dated 09/03/2020. FINDINGS: Mild diffuse interstitial coarsening and biapical subpleural scarring. No focal consolidation, pleural effusion, or pneumothorax. Mild cardiomegaly. Atherosclerotic calcification of the aorta. Osteopenia with degenerative changes of the spine and left shoulder. No acute osseous pathology. IMPRESSION: No acute cardiopulmonary process. Electronically Signed   By: Anner Crete M.D.   On: 12/01/2020 19:11    Labs on Admission: I have personally reviewed following labs  CBC: Recent Labs  Lab  12/01/20 1149  WBC 22.0*  HGB 10.5*  HCT 33.4*  MCV 85.2  PLT 098   Basic Metabolic Panel: Recent Labs  Lab 12/01/20 1149  NA 139  K 3.7  CL 110  CO2 23  GLUCOSE 161*  BUN 26*  CREATININE 1.45*  CALCIUM 9.2   GFR: Estimated Creatinine Clearance: 31.3 mL/min (A) (by C-G formula based on SCr of 1.45 mg/dL (H)).  Thyroid Function Tests: Recent Labs    12/01/20 1149  TSH 1.508  FREET4 1.05   Anemia Panel: No results for input(s): VITAMINB12, FOLATE, FERRITIN, TIBC, IRON, RETICCTPCT in the last 72 hours.  Urine analysis:    Component Value Date/Time   COLORURINE YELLOW (A) 09/02/2020 1541   APPEARANCEUR CLOUDY (A) 09/02/2020 1541   APPEARANCEUR Cloudy (A) 10/17/2019 1042   LABSPEC 1.021 09/02/2020 1541   LABSPEC 1.010 07/12/2011 1236   PHURINE 5.0 09/02/2020 1541   GLUCOSEU NEGATIVE 09/02/2020 1541   GLUCOSEU Negative 07/12/2011 1236   HGBUR SMALL (A) 09/02/2020 1541   BILIRUBINUR NEGATIVE 09/02/2020 1541   BILIRUBINUR Negative 10/17/2019 1042   BILIRUBINUR Negative 07/12/2011 1236   KETONESUR NEGATIVE 09/02/2020 1541   PROTEINUR NEGATIVE 09/02/2020 1541   NITRITE POSITIVE (A) 09/02/2020 1541   LEUKOCYTESUR LARGE (A) 09/02/2020 1541   LEUKOCYTESUR Negative 07/12/2011 1236   Dr. Tobie Poet [facility]  If 7PM-7AM, please contact overnight-coverage provider If 7AM-7PM, please contact day coverage provider [email or web address]  12/01/2020, 8:05 PM

## 2020-12-01 NOTE — ED Triage Notes (Addendum)
Pt arrives via ems from home c/o generalized weakness. Home health reported pt hr of 123. No hx of afib. Ems reports new onset afib with rate of 144. A&o x 4. Hard of hearing. Needs to read lips per ems.  Pt reports weakness in rt arm and bilateral leg weakness is from previous stroke and not related to event   18g left ac place by ems   Initial BP of 133/67 10mg  cardizem given 1115 by ems  Repeat BP- 96/60 Cbg 161

## 2020-12-01 NOTE — Sepsis Progress Note (Signed)
Code sepsis protocol being monitored by eLink. 

## 2020-12-01 NOTE — ED Provider Notes (Signed)
[facility] Emergency Department Provider Note   ____________________________________________   Event Date/Time   First MD Initiated Contact with Patient 12/01/20 1354     (approximate)  I have reviewed the triage vital signs and the nursing notes.   HISTORY  Chief Complaint Weakness and Atrial Fibrillation    HPI Matthew Greer is a 87 y.o. male with below stated past medical history presents via EMS from home after home health recognized patient's heart rate of 123.  Patient does complain of some generalized weakness but otherwise no complications at this time.  Patient was given 10 mg of Cardizem by EMS at 1115 with decrease in patient's blood pressure from 133/67-96/60.  Patient's only other complaints are weakness in the right arm and bilateral lower extremities from a previous stroke.  Patient currently denies any vision changes, tinnitus, difficulty speaking, facial droop, sore throat, chest pain, shortness of breath, abdominal pain, nausea/vomiting/diarrhea, dysuria, or numbness/paresthesias in any extremity          Past Medical History:  Diagnosis Date   Hypertension    Stroke [facility]) 2013   "minor" - no deficits    Patient Active Problem List   Diagnosis Date Noted   GI bleed 09/03/2020   Right femoral vein DVT (HCC) 09/02/2020   Right hemiplegia (HCC) 09/02/2020   Benign prostatic hyperplasia with urinary retention 08/09/2020   Stage 3a chronic kidney disease (HCC) 07/16/2020   UTI (urinary tract infection) 10/01/2019   Weakness 09/30/2019   Hearing impaired person, bilateral 06/22/2019   HTN (hypertension), malignant    Acute cystitis without hematuria    AKI (acute kidney injury) (HCC)    Obstructive uropathy 04/10/2019   Acute CVA (cerebrovascular accident) (HCC) 04/10/2019   First degree AV block 03/20/2018   B12 deficiency 10/11/2016   History of deep venous thrombosis 10/11/2016   History of GI bleed 10/11/2016   History  of stroke 10/11/2016   Right sided weakness 10/11/2016   Acute blood loss anemia 08/15/2016   Hip hematoma, right, initial encounter 08/15/2016   CVA (cerebral vascular accident) (HCC) 05/25/2016   Problems with swallowing and mastication    Stricture and stenosis of esophagus    Benign essential hypertension 01/27/2016   Kidney stones 01/27/2016   Other nonspecific abnormal finding of lung field 01/27/2016   Pure hypercholesterolemia 01/27/2016   Stroke (HCC) 01/27/2016   Degenerative joint disease of shoulder region 11/10/2013   Rotator cuff tendonitis 11/10/2013   Hearing loss 04/09/2013   Dysphagia 03/19/2013   Hypertension 01/10/2013   Eczema 07/26/2012   Abdominal pain 12/27/2011    Past Surgical History:  Procedure Laterality Date   ESOPHAGEAL DILATION  02/24/2016   Procedure: ESOPHAGEAL DILATION;  Surgeon: Midge Minium, MD;  Location: [facility];  Service: Endoscopy;;   ESOPHAGOGASTRODUODENOSCOPY N/A 09/03/2020   Procedure: ESOPHAGOGASTRODUODENOSCOPY (EGD);  Surgeon: Toledo, Boykin Nearing, MD;  Location: [facility] ENDOSCOPY;  Service: Gastroenterology;  Laterality: N/A;   ESOPHAGOGASTRODUODENOSCOPY (EGD) WITH PROPOFOL N/A 02/24/2016   Procedure: ESOPHAGOGASTRODUODENOSCOPY (EGD) WITH PROPOFOL;  Surgeon: Midge Minium, MD;  Location: [facility];  Service: Endoscopy;  Laterality: N/A;   ESOPHAGOGASTRODUODENOSCOPY ENDOSCOPY  03/2013   Dr. Servando Snare   FLEXIBLE SIGMOIDOSCOPY N/A 09/03/2020   Procedure: FLEXIBLE SIGMOIDOSCOPY;  Surgeon: Toledo, Boykin Nearing, MD;  Location: [facility] ENDOSCOPY;  Service: Gastroenterology;  Laterality: N/A;   IVC FILTER INSERTION N/A 09/04/2020   Procedure: IVC FILTER INSERTION;  Surgeon: Nada Libman, MD;  Location: [facility] INVASIVE CV LAB;  Service:  Cardiovascular;  Laterality: N/A;   THORACOTOMY  1951    Prior to Admission medications   Medication Sig Start Date End Date Taking? Authorizing Provider  acetaminophen (TYLENOL) 325 MG tablet Take 325-650 mg  by mouth every 6 (six) hours as needed for mild pain, moderate pain, fever or headache.     [provider]  amLODipine (NORVASC) 5 MG tablet Take 1 tablet (5 mg total) by mouth daily. 04/13/19   Enedina Finner, MD  aspirin EC 81 MG EC tablet Take 1 tablet (81 mg total) by mouth daily. 04/13/19   Enedina Finner, MD  atorvastatin (LIPITOR) 10 MG tablet Take 10 mg by mouth every evening.     [provider]  clopidogrel (PLAVIX) 75 MG tablet Take 75 mg by mouth daily.    [provider]  famotidine (PEPCID) 40 MG tablet Take 40 mg by mouth 2 (two) times daily. 09/15/20   [provider]  HYDROcodone-acetaminophen (NORCO/VICODIN) 5-325 MG tablet Take 1 tablet by mouth every 6 (six) hours as needed for severe pain. 09/08/20   Jene Every, MD  loratadine (CLARITIN) 10 MG tablet Take 10 mg by mouth daily.    [provider]  losartan (COZAAR) 25 MG tablet Take 25 mg by mouth daily. 09/15/19   [provider]  metoprolol (LOPRESSOR) 50 MG tablet Take 1 tablet (50 mg total) by mouth 2 (two) times daily. 05/28/16   Adrian Saran, MD  pantoprazole (PROTONIX) 40 MG tablet Take 1 tablet (40 mg total) by mouth 2 (two) times daily. Acid reflux medication to help your esophagus heal. 09/06/20 10/06/20  Darlin Priestly, MD  STOOL SOFTENER 100 MG capsule  04/22/20   [provider]    Allergies Cephalexin, Chlorhexidine, Finasteride, Levofloxacin, and Lisinopril  History reviewed. No pertinent family history.  Social History Social History   Tobacco Use   Smoking status: Former    Pack years: 0.00   Smokeless tobacco: Never  Substance Use Topics   Alcohol use: No   Drug use: Never    Review of Systems Constitutional: No fever/chills Eyes: No visual changes. ENT: No sore throat. Cardiovascular: Denies chest pain. Respiratory: Denies shortness of breath. Gastrointestinal: No abdominal pain.  No nausea, no vomiting.  No diarrhea. Genitourinary: Negative for  dysuria. Musculoskeletal: Negative for acute arthralgias Skin: Negative for rash. Neurological: Negative for headaches, numbness/paresthesias in any extremity Psychiatric: Negative for suicidal ideation/homicidal ideation   ____________________________________________   PHYSICAL EXAM:  VITAL SIGNS: ED Triage Vitals  Enc Vitals Group     BP 12/01/20 1143 133/74     Pulse Rate 12/01/20 1143 (!) 106     Resp 12/01/20 1143 18     Temp 12/01/20 1143 98.3 F (36.8 C)     Temp Source 12/01/20 1143 Oral     SpO2 12/01/20 1143 98 %     Weight 12/01/20 1144 149 lb 14.6 oz (68 kg)     Height 12/01/20 1144 5\' 11"  (1.803 m)     Head Circumference --      Peak Flow --      Pain Score 12/01/20 1144 0     Pain Loc --      Pain Edu? --      Excl. in GC? --    Constitutional: Alert and oriented. Well appearing and in no acute distress. Eyes: Conjunctivae are normal. PERRL. Head: Atraumatic. Nose: No congestion/rhinnorhea. Mouth/Throat: Mucous membranes are moist.  Cracked right frontal incisor with mucous membrane erythema in the  maxillary gumline Neck: No stridor Cardiovascular: Tachycardic irregularly irregular rhythm.  Good peripheral circulation. Respiratory: Normal respiratory effort.  No retractions. Gastrointestinal: Soft and nontender. No distention. Musculoskeletal: No obvious deformities Neurologic:  Normal speech and language.  Weakness in the right arm bilateral lower extremities Skin:  Skin is warm and dry. No rash noted. Psychiatric: Mood and affect are normal. Speech and behavior are normal.  ____________________________________________   LABS (all labs ordered are listed, but only abnormal results are displayed)  Labs Reviewed  BASIC METABOLIC PANEL - Abnormal; Notable for the following components:      Result Value   Glucose, Bld 161 (*)    BUN 26 (*)    Creatinine, Ser 1.45 (*)    GFR, Estimated 45 (*)    All other components within normal limits  CBC -  Abnormal; Notable for the following components:   WBC 22.0 (*)    RBC 3.92 (*)    Hemoglobin 10.5 (*)    HCT 33.4 (*)    All other components within normal limits  CULTURE, BLOOD (ROUTINE X 2)  CULTURE, BLOOD (ROUTINE X 2)  URINALYSIS, COMPLETE (UACMP) WITH MICROSCOPIC   ____________________________________________  EKG  ED ECG REPORT I, Merwyn Katos, the attending physician, personally viewed and interpreted this ECG.  Date: 12/01/2020 EKG Time: 1135 Rate: 116 Rhythm: Atrial fibrillation with rapid ventricular response QRS Axis: normal Intervals: normal ST/T Wave abnormalities: normal Narrative Interpretation: A. fib with RVR.  No evidence of acute ischemia  ____________________________________________  RADIOLOGY  ED MD interpretation: CT of the maxillofacial structures with IV contrast shows edema and soft tissue thickening along the right paramidline mandible anteriorly suspicious for cellulitis versus phlegmon without any discrete drainable fluid collection  Official radiology report(s): CT Maxillofacial W Contrast  Result Date: 12/01/2020 CLINICAL DATA:  Tooth trauma.  Leukocytosis. EXAM: CT MAXILLOFACIAL WITH CONTRAST TECHNIQUE: Multidetector CT imaging of the maxillofacial structures was performed with intravenous contrast. Multiplanar CT image reconstructions were also generated. CONTRAST:  50mL OMNIPAQUE IOHEXOL 300 MG/ML  SOLN COMPARISON:  None. FINDINGS: Osseous: Small lucency through the anterior maxillary spine, suggestive of an age-indeterminant nondisplaced fracture. No additional fractures identified; however surrounding streak artifact from dental amalgam limits evaluation. No mandibular dislocation. No destructive process. Severe right TMJ degenerative change. Orbits: Negative. No traumatic or inflammatory finding. Sinuses: Mild mucosal thickening of the inferior left maxillary sinus and scattered ethmoid air cells. No air-fluid levels. Soft tissues: Edema and  soft tissue thickening along the right paramidline mandible anteriorly. No discrete drainable fluid collection identified; however, extensive streak artifact limits evaluation. Scattered periapical lucencies and dental caries involving the mandibular and maxillary teeth, including bilateral medial maxillary incisors and the left medial and lateral incisors. Limited intracranial: No significant or unexpected finding on limited evaluation. IMPRESSION: 1. Edema and soft tissue thickening along the right paramidline mandible anteriorly, suspicious for cellulitis/phlegmon in this patient with leukocytosis. No discrete drainable fluid collection identified; however, extensive streak artifact limits evaluation. 2. Small lucency through the anterior maxillary spine, suggestive of an age-indeterminant nondisplaced fracture. 3. Scattered periapical lucencies and dental caries involving the mandibular and maxillary teeth, including bilateral medial maxillary incisors and the left medial and lateral incisors. Findings discussed with Dr. Alfred Levins Via telephone at 3:35 PM Electronically Signed   By: Feliberto Harts MD   On: 12/01/2020 15:49    ____________________________________________   PROCEDURES  Procedure(s) performed (including Critical Care):  Procedures   ____________________________________________   INITIAL IMPRESSION / ASSESSMENT AND PLAN / ED  COURSE  As part of my medical decision making, I reviewed the following data within the electronic medical record, if available:  Nursing notes reviewed and incorporated, Labs reviewed, EKG interpreted, Old chart reviewed, Radiograph reviewed and Notes from prior ED visits reviewed and incorporated        + atrial fibrillation w/ RVR DDx: Pneumothorax, Pneumonia, Pulmonary Embolus, Tamponade, ACS, Thyrotoxicosis.  No history or evidence decompensated heart failure. Given their history and exam it is likely this patient is unlikely to spontaneously revert  to a rate controlled rhythm and necessitates a thorough workup for their arrhythmia. Workup: ECG, CXR, CBC, BMP, UA, Troponin, BNP, TSH, Ca-Mag-Phos Interventions: Defer Cardioversion (uncertain historical reliability with time of onset, increased risk of thromboembolic stroke).  Start diltiazem bolus and drip  Patients presentation most consistent with A. fib RVR and a periapical infection Reassessment: Patient maintained NSR during multi-hour observation in ED. Disposition: Care of this patient will be signed out to the oncoming physician at the end of my shift.  All pertinent patient information conveyed and all questions answered.  All further care and disposition decisions will be made by the oncoming physician.      ____________________________________________   FINAL CLINICAL IMPRESSION(S) / ED DIAGNOSES  Final diagnoses:  None     ED Discharge Orders     None        Note:  This document was prepared using Dragon voice recognition software and may include unintentional dictation errors.    Merwyn Katos, MD 12/01/20 1600

## 2020-12-01 NOTE — Consult Note (Signed)
Pharmacy Antibiotic Note  Matthew Greer is a 87 y.o. male admitted on 12/01/2020 with cellulitis and phlegmon of mandible secondary to poor dentition . No apparent collection for drainage per imaging. Pharmacy has been consulted for Unasyn dosing.  Patient has cephalexin allergy which is "weakness". Received ceftriaxone in ED with no apparent issues. No report of PCN allergy in chart.  Plan:  Unasyn 3 g IV q6h  Height: 5\' 11"  (180.3 cm) Weight: 68 kg (149 lb 14.6 oz) IBW/kg (Calculated) : 75.3  Temp (24hrs), Avg:98.3 F (36.8 C), Min:98.3 F (36.8 C), Max:98.3 F (36.8 C)  Recent Labs  Lab 12/01/20 1149 12/01/20 1601  WBC 22.0*  --   CREATININE 1.45*  --   LATICACIDVEN  --  1.5    Estimated Creatinine Clearance: 31.3 mL/min (A) (by C-G formula based on SCr of 1.45 mg/dL (H)).    Allergies  Allergen Reactions   Cephalexin Other (See Comments)    weakness   Chlorhexidine    Finasteride Nausea Only and Other (See Comments)    Reaction:  Confusion    Levofloxacin Other (See Comments)   Lisinopril Other (See Comments)    Reaction:  Confusion    Antimicrobials this admission: Clindamycin 6/29 x 1 Ceftriaxone 6/29 x 1 Unasyn 6/30 >>   Dose adjustments this admission: N/A  Microbiology results: 6/29 BCx: pending  Thank you for allowing pharmacy to be a part of this patient's care.  7/29 12/01/2020 8:18 PM

## 2020-12-01 NOTE — ED Provider Notes (Signed)
3:53 PM Assumed care for off going team.   Blood pressure (!) 141/71, pulse 61, temperature 98.3 F (36.8 C), temperature source Oral, resp. rate (!) 24, height 5\' 11"  (1.803 m), weight 68 kg, SpO2 100 %.  See their HPI for full report but in brief Got dilt for afib with white count 22K--> pending CT imaging.  CT imaging does show edema soft tissue along the mandible suspicious for cellulitis, phlegmon.  Discussed with family who states this is new A. fib with RVR.  Heart rates continue to be controlled in the 80s.  Patient meets sepsis criteria with elevated heart rate and elevated white count.  Patient was already started on clindamycin and started on fluid resuscitation.  Will discuss with hospital team for admission.  He has no tenderness at the anterior mandible spine and denies any falls to suggest fracture        , MD 12/01/20 1601

## 2020-12-01 NOTE — ED Notes (Signed)
Pt in CT.

## 2020-12-01 NOTE — ED Notes (Signed)
Bladder scan reveals 382 mL. Partner of pt told this nurse that they have to use straight cath TID to empty urine. Order is in place.  Hearing aid placed in sealed cup at bedside with pt sticker on.

## 2020-12-01 NOTE — Progress Notes (Signed)
CODE SEPSIS - PHARMACY COMMUNICATION  **Broad Spectrum Antibiotics should be administered within 1 hour of Sepsis diagnosis**  Time Code Sepsis Called/Page Received: 6/29 1606  Antibiotics Ordered: clinda  Time of 1st antibiotic administration: 1600  Additional action taken by pharmacy:    If necessary, Name of Provider/Nurse Contacted:      Angelique Blonder ,PharmD Clinical Pharmacist  12/01/2020  4:07 PM

## 2020-12-01 NOTE — ED Notes (Signed)
Attempting to get bloodwork. Inserted peripheral IV but it does not draw blood. Will look on R arm. IVF bolus started.

## 2020-12-02 ENCOUNTER — Observation Stay
Admit: 2020-12-02 | Discharge: 2020-12-02 | Disposition: A | Discharge status: Home / Self Care | Payer: Medicare Other | Attending: Internal Medicine | Admitting: Internal Medicine

## 2020-12-02 DIAGNOSIS — R0602 Shortness of breath: Secondary | ICD-10-CM | POA: Diagnosis present

## 2020-12-02 DIAGNOSIS — Z66 Do not resuscitate: Secondary | ICD-10-CM | POA: Diagnosis present

## 2020-12-02 DIAGNOSIS — K219 Gastro-esophageal reflux disease without esophagitis: Secondary | ICD-10-CM | POA: Diagnosis present

## 2020-12-02 DIAGNOSIS — Z888 Allergy status to other drugs, medicaments and biological substances status: Secondary | ICD-10-CM | POA: Diagnosis not present

## 2020-12-02 DIAGNOSIS — Z87891 Personal history of nicotine dependence: Secondary | ICD-10-CM | POA: Diagnosis not present

## 2020-12-02 DIAGNOSIS — I129 Hypertensive chronic kidney disease with stage 1 through stage 4 chronic kidney disease, or unspecified chronic kidney disease: Secondary | ICD-10-CM | POA: Diagnosis present

## 2020-12-02 DIAGNOSIS — I739 Peripheral vascular disease, unspecified: Secondary | ICD-10-CM | POA: Diagnosis present

## 2020-12-02 DIAGNOSIS — K122 Cellulitis and abscess of mouth: Secondary | ICD-10-CM | POA: Diagnosis present

## 2020-12-02 DIAGNOSIS — H9190 Unspecified hearing loss, unspecified ear: Secondary | ICD-10-CM | POA: Diagnosis present

## 2020-12-02 DIAGNOSIS — Z20822 Contact with and (suspected) exposure to covid-19: Secondary | ICD-10-CM | POA: Diagnosis present

## 2020-12-02 DIAGNOSIS — N401 Enlarged prostate with lower urinary tract symptoms: Secondary | ICD-10-CM | POA: Diagnosis present

## 2020-12-02 DIAGNOSIS — A419 Sepsis, unspecified organism: Secondary | ICD-10-CM | POA: Diagnosis present

## 2020-12-02 DIAGNOSIS — Z87442 Personal history of urinary calculi: Secondary | ICD-10-CM | POA: Diagnosis not present

## 2020-12-02 DIAGNOSIS — Z881 Allergy status to other antibiotic agents status: Secondary | ICD-10-CM | POA: Diagnosis not present

## 2020-12-02 DIAGNOSIS — K029 Dental caries, unspecified: Secondary | ICD-10-CM | POA: Diagnosis present

## 2020-12-02 DIAGNOSIS — I4891 Unspecified atrial fibrillation: Secondary | ICD-10-CM | POA: Diagnosis present

## 2020-12-02 DIAGNOSIS — Z7982 Long term (current) use of aspirin: Secondary | ICD-10-CM | POA: Diagnosis not present

## 2020-12-02 DIAGNOSIS — Z86718 Personal history of other venous thrombosis and embolism: Secondary | ICD-10-CM | POA: Diagnosis not present

## 2020-12-02 DIAGNOSIS — Z95828 Presence of other vascular implants and grafts: Secondary | ICD-10-CM | POA: Diagnosis not present

## 2020-12-02 DIAGNOSIS — N1831 Chronic kidney disease, stage 3a: Secondary | ICD-10-CM | POA: Diagnosis present

## 2020-12-02 DIAGNOSIS — I69351 Hemiplegia and hemiparesis following cerebral infarction affecting right dominant side: Secondary | ICD-10-CM | POA: Diagnosis not present

## 2020-12-02 DIAGNOSIS — Z79899 Other long term (current) drug therapy: Secondary | ICD-10-CM | POA: Diagnosis not present

## 2020-12-02 DIAGNOSIS — Z7902 Long term (current) use of antithrombotics/antiplatelets: Secondary | ICD-10-CM | POA: Diagnosis not present

## 2020-12-02 DIAGNOSIS — R338 Other retention of urine: Secondary | ICD-10-CM | POA: Diagnosis present

## 2020-12-02 DIAGNOSIS — E7800 Pure hypercholesterolemia, unspecified: Secondary | ICD-10-CM | POA: Diagnosis present

## 2020-12-02 DIAGNOSIS — I480 Paroxysmal atrial fibrillation: Secondary | ICD-10-CM | POA: Diagnosis present

## 2020-12-02 LAB — CBC
HCT: 32.4 % — ABNORMAL LOW (ref 39.0–52.0)
Hemoglobin: 10.6 g/dL — ABNORMAL LOW (ref 13.0–17.0)
MCH: 27 pg (ref 26.0–34.0)
MCHC: 32.7 g/dL (ref 30.0–36.0)
MCV: 82.7 fL (ref 80.0–100.0)
Platelets: 266 10*3/uL (ref 150–400)
RBC: 3.92 MIL/uL — ABNORMAL LOW (ref 4.22–5.81)
RDW: 15.1 % (ref 11.5–15.5)
WBC: 14.3 10*3/uL — ABNORMAL HIGH (ref 4.0–10.5)
nRBC: 0 % (ref 0.0–0.2)

## 2020-12-02 LAB — COMPREHENSIVE METABOLIC PANEL
ALT: 15 U/L (ref 0–44)
AST: 18 U/L (ref 15–41)
Albumin: 3.5 g/dL (ref 3.5–5.0)
Alkaline Phosphatase: 92 U/L (ref 38–126)
Anion gap: 11 (ref 5–15)
BUN: 24 mg/dL — ABNORMAL HIGH (ref 8–23)
CO2: 22 mmol/L (ref 22–32)
Calcium: 9.1 mg/dL (ref 8.9–10.3)
Chloride: 109 mmol/L (ref 98–111)
Creatinine, Ser: 1.33 mg/dL — ABNORMAL HIGH (ref 0.61–1.24)
GFR, Estimated: 50 mL/min — ABNORMAL LOW (ref >60)
Glucose, Bld: 104 mg/dL — ABNORMAL HIGH (ref 70–99)
Potassium: 3.8 mmol/L (ref 3.5–5.1)
Sodium: 142 mmol/L (ref 135–145)
Total Bilirubin: 0.8 mg/dL (ref 0.3–1.2)
Total Protein: 6.7 g/dL (ref 6.5–8.1)

## 2020-12-02 LAB — ECHOCARDIOGRAM COMPLETE
Height: 71 in
S' Lateral: 1.89 cm
Weight: 2398.6 oz

## 2020-12-02 LAB — CBG MONITORING, ED: Glucose-Capillary: 97 mg/dL (ref 70–99)

## 2020-12-02 LAB — PROCALCITONIN: Procalcitonin: <0.1 ng/mL

## 2020-12-02 MED ORDER — METOPROLOL TARTRATE 5 MG/5ML IV SOLN
2.5000 mg | Freq: Two times a day (BID) | INTRAVENOUS | Status: DC
  Administered 2020-12-02 (×2): 2.5 mg via INTRAVENOUS
  Filled 2020-12-02 (×2): qty 5

## 2020-12-02 MED ORDER — DILTIAZEM HCL 25 MG/5ML IV SOLN
5.0000 mg | Freq: Once | INTRAVENOUS | Status: AC
  Administered 2020-12-02: 5 mg via INTRAVENOUS
  Filled 2020-12-02: qty 5

## 2020-12-02 MED ORDER — DILTIAZEM HCL-DEXTROSE 125-5 MG/125ML-% IV SOLN (PREMIX)
5.0000 mg/h | INTRAVENOUS | Status: DC
Start: 2020-12-02 — End: 2020-12-03
  Administered 2020-12-02: 5 mg/h via INTRAVENOUS
  Filled 2020-12-02: qty 125

## 2020-12-02 MED ORDER — HALOPERIDOL LACTATE 5 MG/ML IJ SOLN
1.0000 mg | Freq: Four times a day (QID) | INTRAMUSCULAR | Status: DC | PRN
  Administered 2020-12-02: 1 mg via INTRAMUSCULAR
  Filled 2020-12-02: qty 1

## 2020-12-02 MED ORDER — METOPROLOL TARTRATE 50 MG PO TABS
50.0000 mg | ORAL_TABLET | Freq: Two times a day (BID) | ORAL | Status: DC
  Administered 2020-12-02 – 2020-12-04 (×5): 50 mg via ORAL
  Filled 2020-12-02 (×5): qty 1

## 2020-12-02 MED ORDER — SODIUM CHLORIDE 0.9 % IV BOLUS
500.0000 mL | Freq: Once | INTRAVENOUS | Status: AC
  Administered 2020-12-02: 500 mL via INTRAVENOUS

## 2020-12-02 MED ORDER — DILTIAZEM HCL 25 MG/5ML IV SOLN
5.0000 mg | Freq: Once | INTRAVENOUS | Status: AC
  Administered 2020-12-02: 5 mg via INTRAVENOUS

## 2020-12-02 NOTE — Evaluation (Signed)
Clinical/Bedside Swallow Evaluation Patient Details  Name: Matthew Greer MRN: [id number] Date of Birth: [birth year]  Today's Date: 12/02/2020 Time: SLP Start Time (ACUTE ONLY): 0940 SLP Stop Time (ACUTE ONLY): 1040 SLP Time Calculation (min) (ACUTE ONLY): 60 min  Past Medical History:  Past Medical History:  Diagnosis Date   Hypertension    Stroke [facility]) 2013   "minor" - no deficits   Past Surgical History:  Past Surgical History:  Procedure Laterality Date   ESOPHAGEAL DILATION  02/24/2016   Procedure: ESOPHAGEAL DILATION;  Surgeon: Midge Minium, MD;  Location: [facility];  Service: Endoscopy;;   ESOPHAGOGASTRODUODENOSCOPY N/A 09/03/2020   Procedure: ESOPHAGOGASTRODUODENOSCOPY (EGD);  Surgeon: Toledo, Boykin Nearing, MD;  Location: [facility] ENDOSCOPY;  Service: Gastroenterology;  Laterality: N/A;   ESOPHAGOGASTRODUODENOSCOPY (EGD) WITH PROPOFOL N/A 02/24/2016   Procedure: ESOPHAGOGASTRODUODENOSCOPY (EGD) WITH PROPOFOL;  Surgeon: Midge Minium, MD;  Location: [facility];  Service: Endoscopy;  Laterality: N/A;   ESOPHAGOGASTRODUODENOSCOPY ENDOSCOPY  03/2013   Dr. Servando Snare   FLEXIBLE SIGMOIDOSCOPY N/A 09/03/2020   Procedure: FLEXIBLE SIGMOIDOSCOPY;  Surgeon: Toledo, Boykin Nearing, MD;  Location: [facility] ENDOSCOPY;  Service: Gastroenterology;  Laterality: N/A;   IVC FILTER INSERTION N/A 09/04/2020   Procedure: IVC FILTER INSERTION;  Surgeon: Nada Libman, MD;  Location: [facility] INVASIVE CV LAB;  Service: Cardiovascular;  Laterality: N/A;   THORACOTOMY  1953   HPI:  Pt is a 87 y.o. male with medical history significant for HOH w/ HA, prior CVA, with right-sided hemiparesis, hypertension, peripheral vascular disease, right femoral DVT, hyperlipidemia, cholelithiasis, CAD, debility, BPH, presents to the emergency department from home via EMS for chief concerns of generalized weakness.  Per chart review patient was brought in to the emergency department due to elevated heart rate in the 120s with  shortness of breath and dizziness.  At bedside patient states he is not hurting anywhere.  He states that he has left upper extremity contractures due to a stroke 2 years ago.  He lives w/ his partner.  CXR: "No acute cardiopulmonary process.".   Assessment / Plan / Recommendation Clinical Impression  Pt seen today for BSE. Pt has a h/o old CVA w/ chronic R sided weakness including oral motor weakness w/ slight+ dysarthria. Per chart notes, pt exhibited overt s/s of oropharyngeal phase dysphagia at previous BSEs in 2017, 2020. Both pt and Partner stated this resolved and that pt no longer has any "trouble" swallowing. Pt has been eating a "regular" diet at home w/ thin liquids. Noted current CXR this admit: "No acute cardiopulmonary process.". Also, unsure of pt's Baseline Cognitive status.    At this study today, pt appears to present w/ adequate oropharyngeal phase swallow w/ No overt oropharyngeal phase dysphagia noted. Pt consumed po trials w/ No overt, clinical s/s of aspiration during po trials. Pt appears at reduced risk for aspiration following general aspiration precautions; also requires some setup and prep d/t chronic RUE weakness. During po trials, pt consumed all consistencies w/ no overt coughing, decline in vocal quality, or change in respiratory presentation during/post trials. O2 sats 97%. Oral phase appeared [facility] w/ timely bolus management, mastication, and control of bolus propulsion for A-P transfer for swallowing. Soft solid trials were moistened and in small bites. Oral clearing achieved w/ all trial consistencies. OM Exam revealed R unilateral lingual and labial weakness in isolated tasks but adequate ROM and strength for bolus management and oral clearing during po's. Speech intelligible. Pt fed self w/ setup support.   Recommend continue a  mostly Regular consistency diet w/ well-Cut meats, moistened foods d/t missing few Dentition baseline; Thin liquids VIA CUP - pt does not use  straws at home. Recommend general aspiration precautions; recommend Pills WHOLE in Puree for safer, easier swallowing as pt endorsed Larger pills causing difficulty to swallow(partner also mentioned pt had Esophageal lining deficits - a tear?). Education given on Pills in Puree; food consistencies and easy to eat options; general aspiration precautions. NSG to reconsult if any new needs arise. MD agreed. SLP Visit Diagnosis: Dysphagia, unspecified (R13.10)    Aspiration Risk   (reduced following general precautions)    Diet Recommendation  Regular consistency diet w/ well-Cut meats, moistened foods d/t missing few Dentition baseline; Thin liquids VIA CUP - pt does not use straws at home. Recommend general aspiration precautions.  Medication Administration: Whole meds with puree (for ease and safety of swallowing)    Other  Recommendations Recommended Consults: Consider GI evaluation;Consider esophageal assessment (as needed; Dietician f/u for support) Oral Care Recommendations: Oral care BID;Patient independent with oral care (setup) Other Recommendations:  (n/a)   Follow up Recommendations None      Frequency and Duration  (n/a)   (n/a)       Prognosis Prognosis for Safe Diet Advancement: Good Barriers to Reach Goals: Time post onset;Severity of deficits      Swallow Study   General Date of Onset: 12/02/20 HPI: Pt is a 87 y.o. male with medical history significant for HOH w/ HA, prior CVA, with right-sided hemiparesis, hypertension, peripheral vascular disease, right femoral DVT, hyperlipidemia, cholelithiasis, CAD, debility, BPH, presents to the emergency department from home via EMS for chief concerns of generalized weakness.  Per chart review patient was brought in to the emergency department due to elevated heart rate in the 120s with shortness of breath and dizziness.  At bedside patient states he is not hurting anywhere.  He states that he has left upper extremity contractures due  to a stroke 2 years ago.  He lives w/ his partner.  CXR: "No acute cardiopulmonary process.". Type of Study: Bedside Swallow Evaluation Previous Swallow Assessment: BSE in 2020, 2017 - previous dysphagia diet rec'd then Diet Prior to this Study: Regular;Thin liquids Temperature Spikes Noted: No (wbc 14.3) Respiratory Status: Room air History of Recent Intubation: No Behavior/Cognition: Alert;Cooperative;Pleasant mood (HOH) Oral Cavity Assessment: Within Functional Limits Oral Care Completed by SLP: Yes Oral Cavity - Dentition: Missing dentition;Poor condition (few) Vision: Functional for self-feeding (needs Large Print to read) Self-Feeding Abilities: Able to feed self;Needs assist;Needs set up (RUE weakness baseline) Patient Positioning: Upright in bed (needed min support) Baseline Vocal Quality: Normal Volitional Cough: Strong Volitional Swallow: Able to elicit    Oral/Motor/Sensory Function Overall Oral Motor/Sensory Function: Mild impairment (+ - chronic) Facial ROM: Reduced right Facial Symmetry: Abnormal symmetry right Facial Strength: Reduced right Lingual ROM: Reduced right Lingual Symmetry: Abnormal symmetry right Lingual Strength:  (Fair-adequate) Velum: Within Functional Limits Mandible: Within Functional Limits   Ice Chips Ice chips: Within functional limits Presentation: Spoon (2 trials)   Thin Liquid Thin Liquid: Within functional limits Presentation: Cup;Self Fed (10 trials+) Other Comments: he prefers NOT to use straws    Nectar Thick Nectar Thick Liquid: Not tested   Honey Thick Honey Thick Liquid: Not tested   Puree Puree: Within functional limits Presentation: Spoon;Self Fed (prepped; 8 trials)   Solid     Solid: Within functional limits (grossly) Presentation: Self Fed;Spoon (prepped; 6 trials)      Bertrice Leder 12/02/2020,11:52 AM

## 2020-12-02 NOTE — ED Notes (Signed)
Messaged attending MD, Dr Georgeann Oppenheim, to clarify ordered medications. Will hold am dose of metoprolol per verbal discussion with MD and current vitals, see vitals charted.

## 2020-12-02 NOTE — ED Notes (Signed)
Echo being preformed at bedside.

## 2020-12-02 NOTE — ED Notes (Signed)
Pt agitated and pulling off monitor wires. Pt cleaned for small smear of stool incontinence and repositioned for comfort. VS obtained, and pt noted to be in afib RVR on monitor. Pt remains afebrile. Dr Arville Care messaged via secure chat for medication and reeval.

## 2020-12-02 NOTE — ED Notes (Signed)
Pt given warm blanket, door left open to monitor pt from nurses station

## 2020-12-02 NOTE — ED Notes (Signed)
Dr Arville Care notified of failed dysphagia screen and held meds. Order given for IV Metoprolol to replace PO and speech consult.

## 2020-12-02 NOTE — Progress Notes (Signed)
*  PRELIMINARY RESULTS* Echocardiogram 2D Echocardiogram has been performed.  Cristela Blue 12/02/2020, 9:07 AM

## 2020-12-02 NOTE — Progress Notes (Addendum)
PROGRESS NOTE    Matthew Greer  NAT:[id number] DOB: 02/23/1926 DOA: 12/01/2020 PCP: [facility]    Brief Narrative:  87 y.o. male with medical history significant for prior CVA, with right-sided hemiparesis, hypertension, peripheral vascular disease, right femoral DVT, hyperlipidemia, cholelithiasis, CAD, debility, BPH, presents to the emergency department from home via EMS for chief concerns of generalized weakness.   Per chart review patient was brought in to the emergency department due to elevated heart rate in the 120s with shortness of breath and dizziness.   At bedside patient states he is not hurting anywhere.  He states that he has left upper extremity contractures due to a stroke 2 years ago.  He does not know why he was brought to the emergency department.   Patient is not sure why he is lost his front teeth.  He does not know whether he is fallen recently.  He was not able to tell me his age, or the current calendar year.  He knows his name and that he is in the hospital.  Patient developed atrial fibrillation with rapid ventricular response requiring initiation of Cardizem gtt.  Blood pressure and heart rate have improved.  Patient is nontoxic-appearing.   Assessment & Plan:   Principal Problem:   Atrial fibrillation with RVR (HCC) Active Problems:   Benign essential hypertension   Hearing loss   Pure hypercholesterolemia   CVA (cerebral vascular accident) (HCC)   AKI (acute kidney injury) (HCC)   Hearing impaired person, bilateral   Weakness   Benign prostatic hyperplasia with urinary retention   History of stroke   Hypertension   Stage 3a chronic kidney disease (HCC)   Cellulitis   Paroxysmal A-fib (HCC)   Sepsis (HCC)   Atrial fibrillation with rapid ventricular response (HCC)  Mandibular cellulitis suspect secondary to dental caries infection CT maxillofacial with no abscess Sepsis criteria with tachypnea, tachycardia, leukocytosis Suspected  source is right paramidline mandibular cellulitis Plan: Unasyn per pharmacy dosing Follow blood cultures, no growth to date As needed fluid boluses Monitor vitals and fever curve Outpatient follow-up with dentist  Atrial fibrillation with rapid ventricular response Likely triggered by underlying infection Per previous notes patient had self resolving atrial fibrillation on previous admission He is on metoprolol titrate PTA Was started on Cardizem gtt. for improved rate control Plan: Upgrade to PCU status given Cardizem gtt. Wean Cardizem as tolerated Start metoprolol 50 mg p.o. twice daily 20 monitoring for now Treat infection as above No systemic anticoagulation given advanced age, fall risk, history of GI bleed Outpatient follow-up with cardiology  Hypertension metoprolol tartrate 50 mg twice daily, losartan 25 mg daily, amlodipine 5 mg daily resumed  Hyperlipidemia atorvastatin 10 mg nightly resumed  GERD PPI twice daily  PAD Plavix 75 mg daily, aspirin 81 mg daily resumed  History of right femoral DVT status post IVC filter placed on 09/04/2020 Poor candidate for anticoagulation   DVT prophylaxis: SCDs Code Status: DNR.  Discussed with patient and partner at bedside Family Communication: Partner at bedside 6/30 Disposition Plan: Status is: Observation  The patient will require care spanning > 2 midnights and should be moved to inpatient because: Inpatient level of care appropriate due to severity of illness  Dispo: The patient is from: Home              Anticipated d/c is to: Home              Patient currently is not medically stable to d/c.  Difficult to place patient No       Level of care: Progressive Cardiac  Consultants:  None  Procedures:  None  Antimicrobials:  Unasyn   Subjective: Patient seen and examined.  States he feels well.  No specific complaints.  Objective: Vitals:   12/02/20 1315 12/02/20 1353 12/02/20 1400 12/02/20 1429   BP:   128/70   Pulse: 85 77 65   Resp: 20  18   Temp:    97.9 F (36.6 C)  TempSrc:    Oral  SpO2: 100% 100% 96%   Weight:      Height:        Intake/Output Summary (Last 24 hours) at 12/02/2020 1511 Last data filed at 12/02/2020 0813 Gross per 24 hour  Intake 1000 ml  Output 1850 ml  Net -850 ml   Filed Weights   12/01/20 1144  Weight: 68 kg    Examination:  General exam: No acute distress.  Marked hearing loss Respiratory system: Lungs clear.  Normal work of breathing.  Room air Cardiovascular system: S1-S2, tachycardic, regular rhythm, no murmurs, no pedal edema Gastrointestinal system: Soft, nontender, nondistended, normal bowel sounds Central nervous system: Alert, oriented x1, no focal deficits Extremities: Symmetric 5 x 5 power. Skin: Pale, no obvious rashes or lesions Psychiatry: Judgement and insight appear impaired. Mood & affect flattened.     Data Reviewed: I have personally reviewed following labs and imaging studies  CBC: Recent Labs  Lab 12/01/20 1149 12/02/20 0728  WBC 22.0* 14.3*  HGB 10.5* 10.6*  HCT 33.4* 32.4*  MCV 85.2 82.7  PLT 320 266   Basic Metabolic Panel: Recent Labs  Lab 12/01/20 1149 12/02/20 0728  NA 139 142  K 3.7 3.8  CL 110 109  CO2 23 22  GLUCOSE 161* 104*  BUN 26* 24*  CREATININE 1.45* 1.33*  CALCIUM 9.2 9.1   GFR: Estimated Creatinine Clearance: 34.1 mL/min (A) (by C-G formula based on SCr of 1.33 mg/dL (H)). Liver Function Tests: Recent Labs  Lab 12/02/20 0728  AST 18  ALT 15  ALKPHOS 92  BILITOT 0.8  PROT 6.7  ALBUMIN 3.5   No results for input(s): LIPASE, AMYLASE in the last 168 hours. No results for input(s): AMMONIA in the last 168 hours. Coagulation Profile: No results for input(s): INR, PROTIME in the last 168 hours. Cardiac Enzymes: No results for input(s): CKTOTAL, CKMB, CKMBINDEX, TROPONINI in the last 168 hours. BNP (last 3 results) No results for input(s): PROBNP in the last 8760  hours. HbA1C: No results for input(s): HGBA1C in the last 72 hours. CBG: Recent Labs  Lab 12/02/20 0150  GLUCAP 97   Lipid Profile: No results for input(s): CHOL, HDL, LDLCALC, TRIG, CHOLHDL, LDLDIRECT in the last 72 hours. Thyroid Function Tests: Recent Labs    12/01/20 1149  TSH 1.508  FREET4 1.05   Anemia Panel: No results for input(s): VITAMINB12, FOLATE, FERRITIN, TIBC, IRON, RETICCTPCT in the last 72 hours. Sepsis Labs: Recent Labs  Lab 12/01/20 1601 12/01/20 1931 12/02/20 0728  PROCALCITON  --  <0.10 <0.10  LATICACIDVEN 1.5  --   --     Recent Results (from the past 240 hour(s))  Culture, blood (routine x 2)     Status: None (Preliminary result)   Collection Time: 12/01/20  4:01 PM   Specimen: BLOOD  Result Value Ref Range Status   Specimen Description BLOOD BLOOD RIGHT FOREARM  Final   Special Requests   Final    BOTTLES DRAWN  AEROBIC AND ANAEROBIC Blood Culture results may not be optimal due to an excessive volume of blood received in culture bottles   Culture   Final    NO GROWTH < 24 HOURS Performed at [facility], [address] [place], [place] [address]    Report Status PENDING  Incomplete  Culture, blood (routine x 2)     Status: None (Preliminary result)   Collection Time: 12/01/20  4:01 PM   Specimen: BLOOD  Result Value Ref Range Status   Specimen Description BLOOD RIGHT ANTECUBITAL  Final   Special Requests   Final    BOTTLES DRAWN AEROBIC AND ANAEROBIC Blood Culture results may not be optimal due to an excessive volume of blood received in culture bottles   Culture   Final    NO GROWTH < 24 HOURS Performed at [facility], [address] [place], [place] [address]    Report Status PENDING  Incomplete  Resp Panel by RT-PCR (Flu A&B, Covid) Nasopharyngeal Swab     Status: None   Collection Time: 12/01/20  4:01 PM   Specimen: Nasopharyngeal Swab; Nasopharyngeal(NP) swabs in vial transport medium  Result Value Ref Range  Status   SARS Coronavirus 2 by RT PCR NEGATIVE NEGATIVE Final    Comment: (NOTE) SARS-CoV-2 target nucleic acids are NOT DETECTED.  The SARS-CoV-2 RNA is generally detectable in upper respiratory specimens during the acute phase of infection. The lowest concentration of SARS-CoV-2 viral copies this assay can detect is 138 copies/mL. A negative result does not preclude SARS-Cov-2 infection and should not be used as the sole basis for treatment or other patient management decisions. A negative result may occur with  improper specimen collection/handling, submission of specimen other than nasopharyngeal swab, presence of viral mutation(s) within the areas targeted by this assay, and inadequate number of viral copies(<138 copies/mL). A negative result must be combined with clinical observations, patient history, and epidemiological information. The expected result is Negative.  Fact Sheet for Patients:  BloggerCourse.com  Fact Sheet for Healthcare Providers:  SeriousBroker.it  This test is no t yet approved or cleared by the [place] FDA and  has been authorized for detection and/or diagnosis of SARS-CoV-2 by FDA under an Emergency Use Authorization (EUA). This EUA will remain  in effect (meaning this test can be used) for the duration of the COVID-19 declaration under Section 564(b)(1) of the Act, 21 U.S.C.section 360bbb-3(b)(1), unless the authorization is terminated  or revoked sooner.       Influenza A by PCR NEGATIVE NEGATIVE Final   Influenza B by PCR NEGATIVE NEGATIVE Final    Comment: (NOTE) The Xpert Xpress SARS-CoV-2/FLU/RSV plus assay is intended as an aid in the diagnosis of influenza from Nasopharyngeal swab specimens and should not be used as a sole basis for treatment. Nasal washings and aspirates are unacceptable for Xpert Xpress SARS-CoV-2/FLU/RSV testing.  Fact Sheet for  Patients: BloggerCourse.com  Fact Sheet for Healthcare Providers: SeriousBroker.it  This test is not yet approved or cleared by the [place] FDA and has been authorized for detection and/or diagnosis of SARS-CoV-2 by FDA under an Emergency Use Authorization (EUA). This EUA will remain in effect (meaning this test can be used) for the duration of the COVID-19 declaration under Section 564(b)(1) of the Act, 21 U.S.C. section 360bbb-3(b)(1), unless the authorization is terminated or revoked.  Performed at [facility], [address], [place], [place] [address]          Radiology Studies: CT  Maxillofacial W Contrast  Result Date: 12/01/2020 CLINICAL DATA:  Tooth trauma.  Leukocytosis. EXAM: CT MAXILLOFACIAL WITH CONTRAST TECHNIQUE: Multidetector CT imaging of the maxillofacial structures was performed with intravenous contrast. Multiplanar CT image reconstructions were also generated. CONTRAST:  76mL OMNIPAQUE IOHEXOL 300 MG/ML  SOLN COMPARISON:  None. FINDINGS: Osseous: Small lucency through the anterior maxillary spine, suggestive of an age-indeterminant nondisplaced fracture. No additional fractures identified; however surrounding streak artifact from dental amalgam limits evaluation. No mandibular dislocation. No destructive process. Severe right TMJ degenerative change. Orbits: Negative. No traumatic or inflammatory finding. Sinuses: Mild mucosal thickening of the inferior left maxillary sinus and scattered ethmoid air cells. No air-fluid levels. Soft tissues: Edema and soft tissue thickening along the right paramidline mandible anteriorly. No discrete drainable fluid collection identified; however, extensive streak artifact limits evaluation. Scattered periapical lucencies and dental caries involving the mandibular and maxillary teeth, including bilateral medial maxillary incisors and the left medial and lateral incisors.  Limited intracranial: No significant or unexpected finding on limited evaluation. IMPRESSION: 1. Edema and soft tissue thickening along the right paramidline mandible anteriorly, suspicious for cellulitis/phlegmon in this patient with leukocytosis. No discrete drainable fluid collection identified; however, extensive streak artifact limits evaluation. 2. Small lucency through the anterior maxillary spine, suggestive of an age-indeterminant nondisplaced fracture. 3. Scattered periapical lucencies and dental caries involving the mandibular and maxillary teeth, including bilateral medial maxillary incisors and the left medial and lateral incisors. Findings discussed with Dr. Alfred Levins Via telephone at 3:35 PM Electronically Signed   By: Feliberto Harts MD   On: 12/01/2020 15:49   DG Chest Port 1 View  Result Date: 12/01/2020 CLINICAL DATA:  87 year old male with sepsis. EXAM: PORTABLE CHEST 1 VIEW COMPARISON:  Chest radiograph dated 09/03/2020. FINDINGS: Mild diffuse interstitial coarsening and biapical subpleural scarring. No focal consolidation, pleural effusion, or pneumothorax. Mild cardiomegaly. Atherosclerotic calcification of the aorta. Osteopenia with degenerative changes of the spine and left shoulder. No acute osseous pathology. IMPRESSION: No acute cardiopulmonary process. Electronically Signed   By: Elgie Collard M.D.   On: 12/01/2020 19:11   ECHOCARDIOGRAM COMPLETE  Result Date: 12/02/2020    ECHOCARDIOGRAM REPORT   Patient Name:   Matthew Greer Date of Exam: 12/02/2020 Medical Rec #:  [id number]      Height:       71.0 in Accession #:    [id number]     Weight:       149.9 lb Date of Birth:  [birth year]       BSA:          1.865 m Patient Age:    89 years       BP:           115/76 mmHg Patient Gender: M              HR:           98 bpm. Exam Location:  [facility] Procedure: 2D Echo, Cardiac Doppler and Color Doppler Indications:     Atrial Fibrillation I48.91  History:         Patient has prior history of  Echocardiogram examinations, most                  recent 04/11/2019. Stroke; Risk Factors:Hypertension.  Sonographer:     Cristela Blue RDCS (AE) Referring Phys:  [id number] AMY N [facility] Diagnosing Phys: Harold Hedge MD  Sonographer Comments: No apical window and Technically challenging study due to limited acoustic windows. IMPRESSIONS  1. Left  ventricular ejection fraction, by estimation, is 65 to 70%. The left ventricle has normal function. The left ventricle has no regional wall motion abnormalities. There is moderate left ventricular hypertrophy. Left ventricular diastolic parameters are consistent with Grade I diastolic dysfunction (impaired relaxation).  2. Right ventricular systolic function is normal. The right ventricular size is normal.  3. The mitral valve is grossly normal. Mild mitral valve regurgitation.  4. The aortic valve was not well visualized. Aortic valve regurgitation is trivial.  5. Aortic dilatation noted. There is borderline dilatation of the aortic root. FINDINGS  Left Ventricle: Left ventricular ejection fraction, by estimation, is 65 to 70%. The left ventricle has normal function. The left ventricle has no regional wall motion abnormalities. The left ventricular internal cavity size was normal in size. There is  moderate left ventricular hypertrophy. Left ventricular diastolic parameters are consistent with Grade I diastolic dysfunction (impaired relaxation). Right Ventricle: The right ventricular size is normal. No increase in right ventricular wall thickness. Right ventricular systolic function is normal. Left Atrium: Left atrial size was normal in size. Right Atrium: Right atrial size was normal in size. Pericardium: There is no evidence of pericardial effusion. Mitral Valve: The mitral valve is grossly normal. Mild mitral valve regurgitation. Tricuspid Valve: The tricuspid valve is grossly normal. Tricuspid valve regurgitation is mild. Aortic Valve: The aortic valve was not well visualized.  Aortic valve regurgitation is trivial. Pulmonic Valve: The pulmonic valve was not well visualized. Pulmonic valve regurgitation is trivial. Aorta: Aortic dilatation noted. There is borderline dilatation of the aortic root. IAS/Shunts: The interatrial septum was not well visualized.  LEFT VENTRICLE PLAX 2D LVIDd:         3.03 cm LVIDs:         1.89 cm LV PW:         1.08 cm LV IVS:        1.41 cm LVOT diam:     2.20 cm LVOT Area:     3.80 cm  LEFT ATRIUM         Index LA diam:    2.60 cm 1.39 cm/m                        PULMONIC VALVE AORTA                 PV Vmax:        0.51 m/s Ao Root diam: 4.30 cm PV Peak grad:   1.0 mmHg                       RVOT Peak grad: 2 mmHg  TRICUSPID VALVE TR Peak grad:   14.0 mmHg TR Vmax:        187.00 cm/s  SHUNTS Systemic Diam: 2.20 cm Harold HedgeKenneth Fath MD Electronically signed by Harold HedgeKenneth Fath MD Signature Date/Time: 12/02/2020/10:41:18 AM    Final         Scheduled Meds:  amLODipine  5 mg Oral Daily   aspirin EC  81 mg Oral Daily   atorvastatin  10 mg Oral QPM   clopidogrel  75 mg Oral Daily   famotidine  40 mg Oral BID   loratadine  10 mg Oral Daily   losartan  25 mg Oral Daily   metoprolol tartrate  2.5 mg Intravenous BID   metoprolol tartrate  50 mg Oral BID   pantoprazole  40 mg Oral BID   Continuous Infusions:  ampicillin-sulbactam (UNASYN) IV Stopped (  12/02/20 1025)   diltiazem (CARDIZEM) infusion Stopped (12/02/20 1409)     LOS: 0 days    Time spent: 25 minutes    Tresa Moore, MD [facility] Pager [phone]  If 7PM-7AM, please contact night-coverage 12/02/2020, 3:11 PM

## 2020-12-02 NOTE — ED Notes (Addendum)
Per another RN, pt was calling out saying he needed to leave. Pt denies needs at this time and is not attempting to get out of bed. Pt repositioned in bed for comfort. Bed alarm turned on.

## 2020-12-02 NOTE — ED Notes (Signed)
Patient repositioned, bed linen changed, and peri care preformed.

## 2020-12-03 LAB — BASIC METABOLIC PANEL
Anion gap: 6 (ref 5–15)
BUN: 26 mg/dL — ABNORMAL HIGH (ref 8–23)
CO2: 23 mmol/L (ref 22–32)
Calcium: 8.3 mg/dL — ABNORMAL LOW (ref 8.9–10.3)
Chloride: 112 mmol/L — ABNORMAL HIGH (ref 98–111)
Creatinine, Ser: 1.59 mg/dL — ABNORMAL HIGH (ref 0.61–1.24)
GFR, Estimated: 40 mL/min — ABNORMAL LOW (ref >60)
Glucose, Bld: 93 mg/dL (ref 70–99)
Potassium: 3.5 mmol/L (ref 3.5–5.1)
Sodium: 141 mmol/L (ref 135–145)

## 2020-12-03 LAB — MAGNESIUM: Magnesium: 1.8 mg/dL (ref 1.7–2.4)

## 2020-12-03 LAB — CBC WITH DIFFERENTIAL/PLATELET
Abs Immature Granulocytes: 0.04 10*3/uL (ref 0.00–0.07)
Basophils Absolute: 0 10*3/uL (ref 0.0–0.1)
Basophils Relative: 0 %
Eosinophils Absolute: 0.2 10*3/uL (ref 0.0–0.5)
Eosinophils Relative: 3 %
HCT: 26.8 % — ABNORMAL LOW (ref 39.0–52.0)
Hemoglobin: 8.7 g/dL — ABNORMAL LOW (ref 13.0–17.0)
Immature Granulocytes: 1 %
Lymphocytes Relative: 19 %
Lymphs Abs: 1.6 10*3/uL (ref 0.7–4.0)
MCH: 26.6 pg (ref 26.0–34.0)
MCHC: 32.5 g/dL (ref 30.0–36.0)
MCV: 82 fL (ref 80.0–100.0)
Monocytes Absolute: 1 10*3/uL (ref 0.1–1.0)
Monocytes Relative: 11 %
Neutro Abs: 5.7 10*3/uL (ref 1.7–7.7)
Neutrophils Relative %: 66 %
Platelets: 238 10*3/uL (ref 150–400)
RBC: 3.27 MIL/uL — ABNORMAL LOW (ref 4.22–5.81)
RDW: 14.9 % (ref 11.5–15.5)
WBC: 8.5 10*3/uL (ref 4.0–10.5)
nRBC: 0 % (ref 0.0–0.2)

## 2020-12-03 MED ORDER — AMLODIPINE BESYLATE 10 MG PO TABS
10.0000 mg | ORAL_TABLET | Freq: Every day | ORAL | Status: DC
  Administered 2020-12-03 – 2020-12-04 (×2): 10 mg via ORAL
  Filled 2020-12-03 (×2): qty 1

## 2020-12-03 MED ORDER — SODIUM CHLORIDE 0.9 % IV SOLN
3.0000 g | Freq: Three times a day (TID) | INTRAVENOUS | Status: DC
  Administered 2020-12-03 – 2020-12-04 (×3): 3 g via INTRAVENOUS
  Filled 2020-12-03: qty 3
  Filled 2020-12-03 (×2): qty 8
  Filled 2020-12-03 (×2): qty 3

## 2020-12-03 MED ORDER — SODIUM CHLORIDE 0.9 % IV BOLUS
500.0000 mL | Freq: Once | INTRAVENOUS | Status: AC
  Administered 2020-12-03: 500 mL via INTRAVENOUS

## 2020-12-03 NOTE — Progress Notes (Signed)
PROGRESS NOTE    Matthew Greer  ZOX:[id number] DOB: 09-05-1925 DOA: 12/01/2020 PCP: [facility]    Brief Narrative:  87 y.o. male with medical history significant for prior CVA, with right-sided hemiparesis, hypertension, peripheral vascular disease, right femoral DVT, hyperlipidemia, cholelithiasis, CAD, debility, BPH, presents to the emergency department from home via EMS for chief concerns of generalized weakness.   Per chart review patient was brought in to the emergency department due to elevated heart rate in the 120s with shortness of breath and dizziness.   At bedside patient states he is not hurting anywhere.  He states that he has left upper extremity contractures due to a stroke 2 years ago.  He does not know why he was brought to the emergency department.   Patient is not sure why he is lost his front teeth.  He does not know whether he is fallen recently.  He was not able to tell me his age, or the current calendar year.  He knows his name and that he is in the hospital.  Patient developed atrial fibrillation with rapid ventricular response requiring initiation of Cardizem gtt.  Blood pressure and heart rate have improved.  Patient is nontoxic-appearing.  7/1: Heart rate is improved and diltiazem drip has been weaned off.  Stable in sinus rhythm.   Assessment & Plan:   Principal Problem:   Atrial fibrillation with RVR (HCC) Active Problems:   Benign essential hypertension   Hearing loss   Pure hypercholesterolemia   CVA (cerebral vascular accident) (HCC)   AKI (acute kidney injury) (HCC)   Hearing impaired person, bilateral   Weakness   Benign prostatic hyperplasia with urinary retention   History of stroke   Hypertension   Stage 3a chronic kidney disease (HCC)   Cellulitis   Paroxysmal A-fib (HCC)   Sepsis (HCC)   Atrial fibrillation with rapid ventricular response (HCC)  Mandibular cellulitis suspect secondary to dental caries infection CT  maxillofacial with no abscess Sepsis criteria with tachypnea, tachycardia, leukocytosis Suspected source is right paramidline mandibular cellulitis  sepsis physiology improving  Plan: Continue Unasyn, appreciate pharmacy dosing Follow blood cultures, no growth to date As needed fluid boluses Monitor vitals and fever curve Outpatient follow-up with dentist Possible discharge in 24 hours if patient remained stable  Atrial fibrillation with rapid ventricular response Likely triggered by underlying infection Per previous notes patient had self resolving atrial fibrillation on previous admission He is on metoprolol titrate PTA Was started on Cardizem gtt. for improved rate control Cardizem drip weaned off and patient converted to sinus rhythm Plan: No further need for Cardizem gtt. Continue metoprolol titrate 50 mg p.o. twice daily Treat infection as above No systemic anticoagulation given advanced age, fall risk, history of GI bleed Outpatient follow-up with cardiology  Hypertension metoprolol tartrate 50 mg twice daily, losartan 25 mg daily, amlodipine 5 mg daily resumed  Hyperlipidemia atorvastatin 10 mg nightly resumed  GERD PPI twice daily  PAD Plavix 75 mg daily, aspirin 81 mg daily resumed  History of right femoral DVT status post IVC filter placed on 09/04/2020 Poor candidate for anticoagulation   DVT prophylaxis: SCDs Code Status: DNR.  Discussed with patient and partner at bedside Family Communication: Partner at bedside 6/30 Disposition Plan: Status is: Inpatient  Remains inpatient appropriate because:Inpatient level of care appropriate due to severity of illness  Dispo: The patient is from: Home              Anticipated d/c is to:  Home              Patient currently is not medically stable to d/c.   Difficult to place patient No  Rapid atrial fibrillation in the setting of odontogenic infection.  Heart rate improved.  Remains on IV antibiotics.  Possible  disposition in 24 hours.   Level of care: Med-Surg  Consultants:  None  Procedures:  None  Antimicrobials:  Unasyn   Subjective: Patient seen and examined.  States he feels well.  No specific complaints.  Objective: Vitals:   12/02/20 1700 12/02/20 1954 12/03/20 0516 12/03/20 1126  BP:  (!) 114/59 (!) 114/47 (!) 155/77  Pulse:  69 71 72  Resp:  20 17 18   Temp:  97.9 F (36.6 C) 98.9 F (37.2 C) 97.9 F (36.6 C)  TempSrc:  Oral Oral   SpO2:  98% 96% 100%  Weight: 69.1 kg     Height:        Intake/Output Summary (Last 24 hours) at 12/03/2020 1333 Last data filed at 12/03/2020 1300 Gross per 24 hour  Intake 1263.5 ml  Output 950 ml  Net 313.5 ml   Filed Weights   12/01/20 1144 12/02/20 1700  Weight: 68 kg 69.1 kg    Examination:  General exam: No acute distress.  Marked hearing loss Respiratory system: Lungs clear.  Normal work of breathing.  Room air Cardiovascular system: S1-S2, regular rate and rhythm, no murmurs, no pedal edema  gastrointestinal system: Soft, nontender, nondistended, normal bowel sounds Central nervous system: Alert, oriented x2, no focal deficits Extremities: Symmetric 5 x 5 power. Skin: Pale, no obvious rashes or lesions Psychiatry: Judgement and insight appear impaired. Mood & affect flattened.     Data Reviewed: I have personally reviewed following labs and imaging studies  CBC: Recent Labs  Lab 12/01/20 1149 12/02/20 0728 12/03/20 0434  WBC 22.0* 14.3* 8.5  NEUTROABS  --   --  5.7  HGB 10.5* 10.6* 8.7*  HCT 33.4* 32.4* 26.8*  MCV 85.2 82.7 82.0  PLT 320 266 238   Basic Metabolic Panel: Recent Labs  Lab 12/01/20 1149 12/02/20 0728 12/03/20 0434  NA 139 142 141  K 3.7 3.8 3.5  CL 110 109 112*  CO2 23 22 23   GLUCOSE 161* 104* 93  BUN 26* 24* 26*  CREATININE 1.45* 1.33* 1.59*  CALCIUM 9.2 9.1 8.3*  MG  --   --  1.8   GFR: Estimated Creatinine Clearance: 29 mL/min (A) (by C-G formula based on SCr of 1.59 mg/dL  (H)). Liver Function Tests: Recent Labs  Lab 12/02/20 0728  AST 18  ALT 15  ALKPHOS 92  BILITOT 0.8  PROT 6.7  ALBUMIN 3.5   No results for input(s): LIPASE, AMYLASE in the last 168 hours. No results for input(s): AMMONIA in the last 168 hours. Coagulation Profile: No results for input(s): INR, PROTIME in the last 168 hours. Cardiac Enzymes: No results for input(s): CKTOTAL, CKMB, CKMBINDEX, TROPONINI in the last 168 hours. BNP (last 3 results) No results for input(s): PROBNP in the last 8760 hours. HbA1C: No results for input(s): HGBA1C in the last 72 hours. CBG: Recent Labs  Lab 12/02/20 0150  GLUCAP 97   Lipid Profile: No results for input(s): CHOL, HDL, LDLCALC, TRIG, CHOLHDL, LDLDIRECT in the last 72 hours. Thyroid Function Tests: Recent Labs    12/01/20 1149  TSH 1.508  FREET4 1.05   Anemia Panel: No results for input(s): VITAMINB12, FOLATE, FERRITIN, TIBC, IRON, RETICCTPCT in the  last 72 hours. Sepsis Labs: Recent Labs  Lab 12/01/20 1601 12/01/20 1931 12/02/20 0728  PROCALCITON  --  <0.10 <0.10  LATICACIDVEN 1.5  --   --     Recent Results (from the past 240 hour(s))  Culture, blood (routine x 2)     Status: None (Preliminary result)   Collection Time: 12/01/20  4:01 PM   Specimen: BLOOD  Result Value Ref Range Status   Specimen Description BLOOD BLOOD RIGHT FOREARM  Final   Special Requests   Final    BOTTLES DRAWN AEROBIC AND ANAEROBIC Blood Culture results may not be optimal due to an excessive volume of blood received in culture bottles   Culture   Final    NO GROWTH 2 DAYS Performed at [facility], [address], [place], [place] [address]    Report Status PENDING  Incomplete  Culture, blood (routine x 2)     Status: None (Preliminary result)   Collection Time: 12/01/20  4:01 PM   Specimen: BLOOD  Result Value Ref Range Status   Specimen Description BLOOD RIGHT ANTECUBITAL  Final   Special Requests   Final    BOTTLES DRAWN  AEROBIC AND ANAEROBIC Blood Culture results may not be optimal due to an excessive volume of blood received in culture bottles   Culture   Final    NO GROWTH 2 DAYS Performed at [facility], [address] [place], [place] [address]    Report Status PENDING  Incomplete  Resp Panel by RT-PCR (Flu A&B, Covid) Nasopharyngeal Swab     Status: None   Collection Time: 12/01/20  4:01 PM   Specimen: Nasopharyngeal Swab; Nasopharyngeal(NP) swabs in vial transport medium  Result Value Ref Range Status   SARS Coronavirus 2 by RT PCR NEGATIVE NEGATIVE Final    Comment: (NOTE) SARS-CoV-2 target nucleic acids are NOT DETECTED.  The SARS-CoV-2 RNA is generally detectable in upper respiratory specimens during the acute phase of infection. The lowest concentration of SARS-CoV-2 viral copies this assay can detect is 138 copies/mL. A negative result does not preclude SARS-Cov-2 infection and should not be used as the sole basis for treatment or other patient management decisions. A negative result may occur with  improper specimen collection/handling, submission of specimen other than nasopharyngeal swab, presence of viral mutation(s) within the areas targeted by this assay, and inadequate number of viral copies(<138 copies/mL). A negative result must be combined with clinical observations, patient history, and epidemiological information. The expected result is Negative.  Fact Sheet for Patients:  BloggerCourse.com  Fact Sheet for Healthcare Providers:  SeriousBroker.it  This test is no t yet approved or cleared by the [place] FDA and  has been authorized for detection and/or diagnosis of SARS-CoV-2 by FDA under an Emergency Use Authorization (EUA). This EUA will remain  in effect (meaning this test can be used) for the duration of the COVID-19 declaration under Section 564(b)(1) of the Act, 21 U.S.C.section 360bbb-3(b)(1), unless  the authorization is terminated  or revoked sooner.       Influenza A by PCR NEGATIVE NEGATIVE Final   Influenza B by PCR NEGATIVE NEGATIVE Final    Comment: (NOTE) The Xpert Xpress SARS-CoV-2/FLU/RSV plus assay is intended as an aid in the diagnosis of influenza from Nasopharyngeal swab specimens and should not be used as a sole basis for treatment. Nasal washings and aspirates are unacceptable for Xpert Xpress SARS-CoV-2/FLU/RSV testing.  Fact Sheet for Patients: BloggerCourse.com  Fact Sheet for Healthcare Providers: SeriousBroker.it  This test is not yet approved or cleared by the [place] and has been authorized for detection and/or diagnosis of SARS-CoV-2 by FDA under an Emergency Use Authorization (EUA). This EUA will remain in effect (meaning this test can be used) for the duration of the COVID-19 declaration under Section 564(b)(1) of the Act, 21 U.S.C. section 360bbb-3(b)(1), unless the authorization is terminated or revoked.  Performed at [facility], [address], [place], [place] [address]          Radiology Studies: CT Maxillofacial W Contrast  Result Date: 12/01/2020 CLINICAL DATA:  Tooth trauma.  Leukocytosis. EXAM: CT MAXILLOFACIAL WITH CONTRAST TECHNIQUE: Multidetector CT imaging of the maxillofacial structures was performed with intravenous contrast. Multiplanar CT image reconstructions were also generated. CONTRAST:  37mL OMNIPAQUE IOHEXOL 300 MG/ML  SOLN COMPARISON:  None. FINDINGS: Osseous: Small lucency through the anterior maxillary spine, suggestive of an age-indeterminant nondisplaced fracture. No additional fractures identified; however surrounding streak artifact from dental amalgam limits evaluation. No mandibular dislocation. No destructive process. Severe right TMJ degenerative change. Orbits: Negative. No traumatic or inflammatory finding. Sinuses: Mild mucosal thickening of  the inferior left maxillary sinus and scattered ethmoid air cells. No air-fluid levels. Soft tissues: Edema and soft tissue thickening along the right paramidline mandible anteriorly. No discrete drainable fluid collection identified; however, extensive streak artifact limits evaluation. Scattered periapical lucencies and dental caries involving the mandibular and maxillary teeth, including bilateral medial maxillary incisors and the left medial and lateral incisors. Limited intracranial: No significant or unexpected finding on limited evaluation. IMPRESSION: 1. Edema and soft tissue thickening along the right paramidline mandible anteriorly, suspicious for cellulitis/phlegmon in this patient with leukocytosis. No discrete drainable fluid collection identified; however, extensive streak artifact limits evaluation. 2. Small lucency through the anterior maxillary spine, suggestive of an age-indeterminant nondisplaced fracture. 3. Scattered periapical lucencies and dental caries involving the mandibular and maxillary teeth, including bilateral medial maxillary incisors and the left medial and lateral incisors. Findings discussed with Dr. Alfred Levins Via telephone at 3:35 PM Electronically Signed   By: Feliberto Harts MD   On: 12/01/2020 15:49   DG Chest Port 1 View  Result Date: 12/01/2020 CLINICAL DATA:  87 year old male with sepsis. EXAM: PORTABLE CHEST 1 VIEW COMPARISON:  Chest radiograph dated 09/03/2020. FINDINGS: Mild diffuse interstitial coarsening and biapical subpleural scarring. No focal consolidation, pleural effusion, or pneumothorax. Mild cardiomegaly. Atherosclerotic calcification of the aorta. Osteopenia with degenerative changes of the spine and left shoulder. No acute osseous pathology. IMPRESSION: No acute cardiopulmonary process. Electronically Signed   By: Elgie Collard M.D.   On: 12/01/2020 19:11   ECHOCARDIOGRAM COMPLETE  Result Date: 12/02/2020    ECHOCARDIOGRAM REPORT   Patient Name:    Matthew Greer Date of Exam: 12/02/2020 Medical Rec #:  [id number]      Height:       71.0 in Accession #:    [id number]     Weight:       149.9 lb Date of Birth:  Jan 24, 1926       BSA:          1.865 m Patient Age:    89 years       BP:           115/76 mmHg Patient Gender: M              HR:           98 bpm. Exam Location:  [facility] Procedure: 2D Echo, Cardiac Doppler  and Color Doppler Indications:     Atrial Fibrillation I48.91  History:         Patient has prior history of Echocardiogram examinations, most                  recent 04/11/2019. Stroke; Risk Factors:Hypertension.  Sonographer:     Cristela BlueJerry Hege RDCS (AE) Referring Phys:  [id number] AMY N COX Diagnosing Phys: Harold HedgeKenneth Fath MD  Sonographer Comments: No apical window and Technically challenging study due to limited acoustic windows. IMPRESSIONS  1. Left ventricular ejection fraction, by estimation, is 65 to 70%. The left ventricle has normal function. The left ventricle has no regional wall motion abnormalities. There is moderate left ventricular hypertrophy. Left ventricular diastolic parameters are consistent with Grade I diastolic dysfunction (impaired relaxation).  2. Right ventricular systolic function is normal. The right ventricular size is normal.  3. The mitral valve is grossly normal. Mild mitral valve regurgitation.  4. The aortic valve was not well visualized. Aortic valve regurgitation is trivial.  5. Aortic dilatation noted. There is borderline dilatation of the aortic root. FINDINGS  Left Ventricle: Left ventricular ejection fraction, by estimation, is 65 to 70%. The left ventricle has normal function. The left ventricle has no regional wall motion abnormalities. The left ventricular internal cavity size was normal in size. There is  moderate left ventricular hypertrophy. Left ventricular diastolic parameters are consistent with Grade I diastolic dysfunction (impaired relaxation). Right Ventricle: The right ventricular size is normal. No increase in  right ventricular wall thickness. Right ventricular systolic function is normal. Left Atrium: Left atrial size was normal in size. Right Atrium: Right atrial size was normal in size. Pericardium: There is no evidence of pericardial effusion. Mitral Valve: The mitral valve is grossly normal. Mild mitral valve regurgitation. Tricuspid Valve: The tricuspid valve is grossly normal. Tricuspid valve regurgitation is mild. Aortic Valve: The aortic valve was not well visualized. Aortic valve regurgitation is trivial. Pulmonic Valve: The pulmonic valve was not well visualized. Pulmonic valve regurgitation is trivial. Aorta: Aortic dilatation noted. There is borderline dilatation of the aortic root. IAS/Shunts: The interatrial septum was not well visualized.  LEFT VENTRICLE PLAX 2D LVIDd:         3.03 cm LVIDs:         1.89 cm LV PW:         1.08 cm LV IVS:        1.41 cm LVOT diam:     2.20 cm LVOT Area:     3.80 cm  LEFT ATRIUM         Index LA diam:    2.60 cm 1.39 cm/m                        PULMONIC VALVE AORTA                 PV Vmax:        0.51 m/s Ao Root diam: 4.30 cm PV Peak grad:   1.0 mmHg                       RVOT Peak grad: 2 mmHg  TRICUSPID VALVE TR Peak grad:   14.0 mmHg TR Vmax:        187.00 cm/s  SHUNTS Systemic Diam: 2.20 cm Harold HedgeKenneth Fath MD Electronically signed by Harold HedgeKenneth Fath MD Signature Date/Time: 12/02/2020/10:41:18 AM    Final         Scheduled  Meds:  amLODipine  10 mg Oral Daily   aspirin EC  81 mg Oral Daily   atorvastatin  10 mg Oral QPM   clopidogrel  75 mg Oral Daily   famotidine  40 mg Oral BID   loratadine  10 mg Oral Daily   metoprolol tartrate  50 mg Oral BID   pantoprazole  40 mg Oral BID   Continuous Infusions:  ampicillin-sulbactam (UNASYN) IV 3 g (12/03/20 1101)     LOS: 1 day    Time spent: 25 minutes    Tresa Moore, MD [facility] Pager [phone]  If 7PM-7AM, please contact night-coverage 12/03/2020, 1:33 PM

## 2020-12-03 NOTE — Progress Notes (Signed)
PHARMACY NOTE:  ANTIMICROBIAL RENAL DOSAGE ADJUSTMENT  Current antimicrobial regimen includes a mismatch between antimicrobial dosage and estimated renal function.  As per policy approved by the Pharmacy & Therapeutics and Medical Executive Committees, the antimicrobial dosage will be adjusted accordingly.  Current antimicrobial dosage:  Unasyn 3g q6h  Indication: Endodontic infection  Renal Function:  Estimated Creatinine Clearance: 29 mL/min (A) (by C-G formula based on SCr of 1.59 mg/dL (H)).    Antimicrobial dosage has been changed to:  Unasyn 3g q8h  Additional comments: Typical dosing < 42ml/min is Unasyn 3g q12 and >45mL/min q8h-q6h dosing. Since pt is on the borderline and still spiking low-grade fevers, will opt for the reduction to q8h to avoid cutting total exposure of abx in half.  If renal fxn worsens or stays at <92ml/min then could go ahead with further interval reduction. Just did not want to overreact and underdose.  Thank you for allowing pharmacy to be a part of this patient's care.  Martyn Malay, [facility] 12/03/2020 10:01 AM

## 2020-12-04 LAB — BASIC METABOLIC PANEL
Anion gap: 7 (ref 5–15)
BUN: 26 mg/dL — ABNORMAL HIGH (ref 8–23)
CO2: 22 mmol/L (ref 22–32)
Calcium: 8.6 mg/dL — ABNORMAL LOW (ref 8.9–10.3)
Chloride: 109 mmol/L (ref 98–111)
Creatinine, Ser: 1.51 mg/dL — ABNORMAL HIGH (ref 0.61–1.24)
GFR, Estimated: 43 mL/min — ABNORMAL LOW (ref >60)
Glucose, Bld: 95 mg/dL (ref 70–99)
Potassium: 3.7 mmol/L (ref 3.5–5.1)
Sodium: 138 mmol/L (ref 135–145)

## 2020-12-04 MED ORDER — AMOXICILLIN-POT CLAVULANATE 875-125 MG PO TABS: 1.0000 | ORAL_TABLET | Freq: Two times a day (BID) | ORAL | Status: DC

## 2020-12-04 MED ORDER — AMOXICILLIN-POT CLAVULANATE 875-125 MG PO TABS: 1.0000 | ORAL_TABLET | Freq: Two times a day (BID) | ORAL | 0 refills | Status: AC

## 2020-12-04 NOTE — Evaluation (Signed)
Occupational Therapy Evaluation Patient Details Name: Matthew Greer MRN: [id number] DOB: September 20, 1925 Today's Date: 12/04/2020    History of Present Illness 87 y.o. male with medical history significant for prior CVA, with right-sided hemiparesis, hypertension, peripheral vascular disease, right femoral DVT, hyperlipidemia, cholelithiasis, CAD, debility, BPH, presents to the emergency department from home via EMS for chief concerns of generalized weakness.  Admitted with high heart rate, shortness of breath and dizziness.   Diagnosed with Mandibular cellulitis  suspect secondary to dental caries infection  CT maxillofacial with no abscess  Sepsis criteria with tachypnea, tachycardia, leukocytosis  Suspected source is right paramidline mandibular cellulitis   sepsis physiology improving.  Pt is HOH and has a right ear hearing aid.   Clinical Impression   Pt seen for OT evaluation this date, pt is extremely HOH, his significant other brought in his hearing aid during evaluation.  Pt lives at home with his significant other, has a caregiver who comes in 5 days a week to assist with self care tasks with bathing and dressing, 1-2 hours a day.  Pt reports he utilizes a wheelchair at home and his baseline level of transfers are min assist.  He was able to demonstrate bed to bedside commode transfers with min assist this date, initially with some shakiness upon standing but improved with multiple reps and trials.  He requires min to moderate assist with self care at home, partner assists as needed and performs medication management, meal prep and household chores.  Pt presents with mild weakness overall and had a previous hospital admission a couple months ago.  He had a stroke in the past and demonstrates right hemiparesis, fingers in fisted position.  Pt was educated and performed PROM to right hand to avoid further contractures.  Although patient and partner report pt appears at baseline with transfers, he would  benefit from OT services to address muscle weakness, ROM and work towards improved independence at home to reduce caregiver burden and provide education on ADL and IADL tasks to help improve independence to be able to remain at home with current support and reduce the need for repeated hospital admissions.  Recommend HHOT.      Follow Up Recommendations  Home health OT    Equipment Recommendations       Recommendations for Other Services       Precautions / Restrictions Precautions Precautions: Fall      Mobility Bed Mobility Overal bed mobility: Needs Assistance Bed Mobility: Supine to Sit;Sit to Supine     Supine to sit: Min assist Sit to supine: Min assist   General bed mobility comments: Pt and significant other report pt is at baseline level of care for bed mobility, transfers and toilet transfers.  W/c use at home and able to access bathroom via w/c.  Sit to stand min assist.  Has a caregiver who comes in 5 days a week for 1-2 hours to assist with self care.  Significant other assists as needed, manages meds and prepares meals.    Transfers Overall transfer level: Needs assistance   Transfers: Sit to/from Stand Sit to Stand: Min assist         General transfer comment: Cues to weight shift forward, posture    Balance Overall balance assessment: Needs assistance Sitting-balance support: No upper extremity supported;Feet supported Sitting balance-Leahy Scale: Good     Standing balance support: No upper extremity supported Standing balance-Leahy Scale: Fair  ADL either performed or assessed with clinical judgement   ADL Overall ADL's : Needs assistance/impaired Eating/Feeding: Set up Eating/Feeding Details (indicate cue type and reason): Pt with prior CVA, right hemiparesis and requires assist with tasks which require 2 hands such as cutting food, opening packages, containers. Grooming: Sitting;Set up   Upper Body  Bathing: Minimal assistance   Lower Body Bathing: Moderate assistance   Upper Body Dressing : Minimal assistance   Lower Body Dressing: Moderate assistance   Toilet Transfer: Minimal assistance   Toileting- Clothing Manipulation and Hygiene: Minimal assistance       Functional mobility during ADLs: Minimal assistance General ADL Comments: Pt able to demonstrate bed to [facility] and back with min assist, sit to stand from bed at lower level with min assist.     Vision Baseline Vision/History: Wears glasses Patient Visual Report: No change from baseline       Perception     Praxis      Pertinent Vitals/Pain Pain Assessment: No/denies pain     Hand Dominance Right   Extremity/Trunk Assessment             Communication Communication Communication: HOH   Cognition Arousal/Alertness: Awake/alert Behavior During Therapy: WFL for tasks assessed/performed Overall Cognitive Status: Within Functional Limits for tasks assessed                                 General Comments: Alert and oriented X 4, patient was emotionally labile throughout session.   General Comments  Pt reports some shakiness upon standing 1st trial, after a few seconds he felt steady and able to repeat sit to stand for multiple trials, min assist    Exercises     Shoulder Instructions      Home Living Family/patient expects to be discharged to:: Private residence Living Arrangements: Spouse/significant other Available Help at Discharge: Personal care attendant Type of Home: House Home Access: Ramped entrance     Home Layout: One level     Bathroom Shower/Tub: Arts development officer Toilet: Handicapped height     Home Equipment: Environmental consultant - 2 wheels;Cane - single point;Wheelchair - manual;Shower seat          Prior Functioning/Environment Level of Independence: Needs assistance  Gait / Transfers Assistance Needed: Pt primarily using w/c for at least 3-4 months now per pt  and his SO. States he is able to propel the w/c himself mostly, but fatigues easily as he is only able to propel with L UE (occasional BLE propulsion as well). SBA for bed to w/c t/f and w/c to commode. ADL's / Homemaking Assistance Needed: Pt has PCA 5 days/wk that helps with bathing, dressing and toileting.            OT Problem List: Decreased strength;Impaired balance (sitting and/or standing);Decreased activity tolerance;Impaired UE functional use      OT Treatment/Interventions: Self-care/ADL training;Therapeutic exercise;DME and/or AE instruction;Patient/family education    OT Goals(Current goals can be found in the care plan section) Acute Rehab OT Goals Patient Stated Goal: To go home, be as independent as possible. OT Goal Formulation: With patient Time For Goal Achievement: 12/18/20 Potential to Achieve Goals: Good ADL Goals Pt Will Transfer to Toilet: with min guard assist Pt/caregiver will Perform Home Exercise Program: Increased ROM;Right Upper extremity  OT Frequency: Min 1X/week   Barriers to D/C:            Co-evaluation  AM-PAC OT "6 Clicks" Daily Activity     Outcome Measure Help from another person eating meals?: A Little Help from another person taking care of personal grooming?: A Little Help from another person toileting, which includes using toliet, bedpan, or urinal?: A Lot Help from another person bathing (including washing, rinsing, drying)?: A Lot Help from another person to put on and taking off regular upper body clothing?: A Little Help from another person to put on and taking off regular lower body clothing?: A Lot 6 Click Score: 15   End of Session Equipment Utilized During Treatment: Gait belt  Activity Tolerance: Patient tolerated treatment well Patient left: in bed;with call bell/phone within reach;with bed alarm set;with family/visitor present  OT Visit Diagnosis: Unsteadiness on feet (R26.81);Muscle weakness  (generalized) (M62.81)                Time: 0910-1001 OT Time Calculation (min): 51 min Charges:  OT General Charges $OT Visit: 1 Visit OT Evaluation $OT Eval Moderate Complexity: 1 Mod OT Treatments $Self Care/Home Management : 8-22 mins $Therapeutic Exercise: 8-22 mins  Fremon Zacharia T Sultan Pargas, OTR/L, CLT   Tine [place] 12/04/2020, 11:11 AM

## 2020-12-04 NOTE — TOC Initial Note (Signed)
Transition of Care [facility]) - Initial/Assessment Note    Patient Details  Name: Matthew Greer MRN: [id number] Date of Birth: 1925-11-21  [facility] CM/SW Contact:    Alberteen Sam, LCSW Phone Number: 12/04/2020, 11:57 AM  Clinical Narrative:                  CSW met with patient and his partner at bedside for discharge planning needs. Reports beign active with home health services of [facility]. States no additional home services needed at this time. Reports having a rolling walker at home, reports not needing a 3in1 right now. No equipment needs identified.   No discharge needs identified at this time.   Expected Discharge Plan: [place] Barriers to Discharge: Continued Medical Work up   Patient Goals and CMS Choice Patient states their goals for this hospitalization and ongoing recovery are:: to go home CMS Medicare.gov Compare Post Acute Care list provided to:: Patient Choice offered to / list presented to : Patient  Expected Discharge Plan and Services Expected Discharge Plan: [place]                                              Prior Living Arrangements/Services   Lives with:: Significant Other                   Activities of Daily Living Home Assistive Devices/Equipment: Wheelchair, Hearing aid, Eyeglasses, Transfer board, Raised toilet seat with rails ADL Screening (condition at time of admission) Patient's cognitive ability adequate to safely complete daily activities?: No Is the patient deaf or have difficulty hearing?: Yes Does the patient have difficulty seeing, even when wearing glasses/contacts?: No Does the patient have difficulty concentrating, remembering, or making decisions?: Yes Patient able to express need for assistance with ADLs?: Yes Does the patient have difficulty dressing or bathing?: Yes Independently performs ADLs?: No Communication: Independent Dressing (OT):  Needs assistance Is this a change from baseline?: Pre-admission baseline Grooming: Needs assistance Is this a change from baseline?: Pre-admission baseline Feeding: Independent Bathing: Needs assistance Is this a change from baseline?: Pre-admission baseline Toileting: Needs assistance Is this a change from baseline?: Pre-admission baseline In/Out Bed: Needs assistance Is this a change from baseline?: Pre-admission baseline Walks in Home: Dependent Is this a change from baseline?: Pre-admission baseline Does the patient have difficulty walking or climbing stairs?: Yes Weakness of Legs: Both Weakness of Arms/Hands: Both  Permission Sought/Granted                  Emotional Assessment Appearance:: Appears stated age Attitude/Demeanor/Rapport: Gracious Affect (typically observed): Calm Orientation: : Oriented to Self, Oriented to Place, Oriented to  Time, Oriented to Situation Alcohol / Substance Use: Not Applicable Psych Involvement: No (comment)  Admission diagnosis:  Shortness of breath [R06.02] Cellulitis [L03.90] Atrial fibrillation with rapid ventricular response (HCC) [I48.91] Atrial fibrillation with RVR ([place]) [I48.91] Infection of tooth [K04.7] Sepsis ([place]) [A41.9] Patient Active Problem List   Diagnosis Date Noted   Atrial fibrillation with rapid ventricular response ([place]) 12/02/2020   Cellulitis 12/01/2020   Paroxysmal A-fib ([place]) 12/01/2020   Atrial fibrillation with RVR ([place]) 12/01/2020   Sepsis ([place]) 12/01/2020   GI bleed 09/03/2020   Right femoral vein DVT ([place]) 09/02/2020   Right hemiplegia ([place]) 09/02/2020   Benign prostatic  hyperplasia with urinary retention 08/09/2020   Stage 3a chronic kidney disease ([place]) 07/16/2020   UTI (urinary tract infection) 10/01/2019   Weakness 09/30/2019   Hearing impaired person, bilateral 06/22/2019   HTN (hypertension), malignant    Acute cystitis without hematuria    AKI (acute kidney injury) ([place])    Obstructive  uropathy 04/10/2019   Acute CVA (cerebrovascular accident) ([place]) 04/10/2019   First degree AV block 03/20/2018   B12 deficiency 10/11/2016   History of deep venous thrombosis 10/11/2016   History of GI bleed 10/11/2016   History of stroke 10/11/2016   Right sided weakness 10/11/2016   Acute blood loss anemia 08/15/2016   Hip hematoma, right, initial encounter 08/15/2016   CVA (cerebral vascular accident) ([place]) 05/25/2016   Problems with swallowing and mastication    Stricture and stenosis of esophagus    Benign essential hypertension 01/27/2016   Kidney stones 01/27/2016   Other nonspecific abnormal finding of lung field 01/27/2016   Pure hypercholesterolemia 01/27/2016   Stroke ([place]) 01/27/2016   Degenerative joint disease of shoulder region 11/10/2013   Rotator cuff tendonitis 11/10/2013   Hearing loss 04/09/2013   Dysphagia 03/19/2013   Hypertension 01/10/2013   Eczema 07/26/2012   Abdominal pain 12/27/2011   PCP:  [facility]:   [id number], [place] - [address] AT [address] [place] [address] Phone: [phone] Fax: [phone]  [facility] [place], [place] - [address] AT [facility] [address] [place] [address] Phone: [phone] Fax: [phone]     Social Determinants of Health (SDOH) Interventions    Readmission Risk Interventions No flowsheet data found.

## 2020-12-04 NOTE — Discharge Summary (Signed)
Physician Discharge Summary  Matthew Greer [facility] XNA:[id number] DOB: 03/04/1926 DOA: 12/01/2020  PCP: [facility]  Admit date: 12/01/2020 Discharge date: 12/04/2020  Admitted From: Home Disposition: Home with home health  Recommendations for Outpatient Follow-up:  Follow up with PCP in 1-2 weeks   Home Health: Yes Equipment/Devices: None  Discharge Condition: Stable CODE STATUS: DNR Diet recommendation: Heart healthy  Brief/Interim Summary: 87 y.o. male with medical history significant for prior CVA, with right-sided hemiparesis, hypertension, peripheral vascular disease, right femoral DVT, hyperlipidemia, cholelithiasis, CAD, debility, BPH, presents to the emergency department from home via EMS for chief concerns of generalized weakness.   Per chart review patient was brought in to the emergency department due to elevated heart rate in the 120s with shortness of breath and dizziness.   At bedside patient states he is not hurting anywhere.  He states that he has left upper extremity contractures due to a stroke 2 years ago.  He does not know why he was brought to the emergency department.   Patient is not sure why he is lost his front teeth.  He does not know whether he is fallen recently.  He was not able to tell me his age, or the current calendar year.  He knows his name and that he is in the hospital.   Patient developed atrial fibrillation with rapid ventricular response requiring initiation of Cardizem gtt.  Blood pressure and heart rate have improved.  Patient is nontoxic-appearing.   7/1: Heart rate is improved and diltiazem drip has been weaned off.  Stable in sinus rhythm.  7/2: On the day of discharge patient is back in sinus rhythm.  Heart rate is well controlled.  No episodes of tachycardia endorsed.  Remained hemodynamically stable nonseptic.  All cultures no growth.  At time of discharge will convert from Unasyn to p.o. Augmentin.  Patient received 3 days in house.   Will recommend another additional 7 days of Augmentin 1 tab twice daily.  Follow-up with outpatient PCP 1 to 2 weeks.  Follow-up with dentist for definitive treatment of odontogenic infection.  I recommend that prior to any surgical intervention the patient hold his Plavix for course of 5 days  Discharge Diagnoses:  Principal Problem:   Atrial fibrillation with RVR (HCC) Active Problems:   Benign essential hypertension   Hearing loss   Pure hypercholesterolemia   CVA (cerebral vascular accident) (HCC)   AKI (acute kidney injury) (HCC)   Hearing impaired person, bilateral   Weakness   Benign prostatic hyperplasia with urinary retention   History of stroke   Hypertension   Stage 3a chronic kidney disease (HCC)   Cellulitis   Paroxysmal A-fib (HCC)   Sepsis (HCC)   Atrial fibrillation with rapid ventricular response (HCC)  Mandibular cellulitis suspect secondary to dental caries infection CT maxillofacial with no abscess Sepsis criteria with tachypnea, tachycardia, leukocytosis Suspected source is right paramidline mandibular cellulitis  sepsis physiology improving Sepsis physiology resolved the time of discharge.  Will discontinue Unasyn and transition to Augmentin.  Complete an additional 7 days.  Outpatient follow-up with PCP within 1 to 2 weeks.  We will also need to follow-up with dentist for definitive treatment of odontogenic infection   Atrial fibrillation with rapid ventricular response Likely triggered by underlying infection Per previous notes patient had self resolving atrial fibrillation on previous admission He is on metoprolol titrate PTA Was started on Cardizem gtt. for improved rate control Cardizem drip weaned off and patient converted to sinus  rhythm Resume metoprolol titrate 50 mg p.o. twice daily at time of discharge.  Follow-up outpatient PCP.  Possible referral to cardiology.  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 placed on 09/04/2020 Poor candidate for anticoagulation  Discharge Instructions  Discharge Instructions     Diet - low sodium heart healthy   Complete by: As directed    Increase activity slowly   Complete by: As directed       Allergies as of 12/04/2020       Reactions   Cephalexin Other (See Comments)   weakness   Chlorhexidine    Finasteride Nausea Only, Other (See Comments)   Reaction:  Confusion    Levofloxacin Other (See Comments)   Lisinopril Other (See Comments)   Reaction:  Confusion        Medication List     TAKE these medications    acetaminophen 325 MG tablet Commonly known as: TYLENOL Take 325-650 mg by mouth every 6 (six) hours as needed for mild pain, moderate pain, fever or headache.   amLODipine 5 MG tablet Commonly known as: NORVASC Take 1 tablet (5 mg total) by mouth daily.   amoxicillin-clavulanate 875-125 MG tablet Commonly known as: AUGMENTIN Take 1 tablet by mouth every 12 (twelve) hours for 7 days. Start taking on: December 05, 2020   aspirin 81 MG EC tablet Take 1 tablet (81 mg total) by mouth daily.   atorvastatin 10 MG tablet Commonly known as: LIPITOR Take 10 mg by mouth every evening.   clopidogrel 75 MG tablet Commonly known as: PLAVIX Take 75 mg by mouth daily.   famotidine 40 MG tablet Commonly known as: PEPCID Take 40 mg by mouth 2 (two) times daily.   HYDROcodone-acetaminophen 5-325 MG tablet Commonly known as: NORCO/VICODIN Take 1 tablet by mouth every 6 (six) hours as needed for severe pain.    loratadine 10 MG tablet Commonly known as: CLARITIN Take 10 mg by mouth daily.   losartan 25 MG tablet Commonly known as: COZAAR Take 25 mg by mouth daily.   metoprolol tartrate 50 MG tablet Commonly known as: LOPRESSOR Take 1 tablet (50 mg total) by mouth 2 (two) times daily.   pantoprazole 40 MG tablet Commonly known as: Protonix Take 1 tablet (40 mg total) by mouth 2 (two) times daily. Acid reflux medication to help your esophagus heal.   Stool Softener 100 MG capsule Generic drug: docusate sodium        Follow-up Information     [place], [facility]. Schedule an appointment as soon as possible for a visit in 1 week(s).   Contact information: [address] [place] [address] [place] [address] [phone]                Allergies  Allergen Reactions   Cephalexin Other (See Comments)    weakness   Chlorhexidine    Finasteride Nausea Only and Other (See Comments)    Reaction:  Confusion    Levofloxacin Other (See Comments)   Lisinopril Other (See Comments)    Reaction:  Confusion  Consultations: None   Procedures/Studies: CT Maxillofacial W Contrast  Result Date: 12/01/2020 CLINICAL DATA:  Tooth trauma.  Leukocytosis. EXAM: CT MAXILLOFACIAL WITH CONTRAST TECHNIQUE: Multidetector CT imaging of the maxillofacial structures was performed with intravenous contrast. Multiplanar CT image reconstructions were also generated. CONTRAST:  60mL OMNIPAQUE IOHEXOL 300 MG/ML  SOLN COMPARISON:  None. FINDINGS: Osseous: Small lucency through the anterior maxillary spine, suggestive of an age-indeterminant nondisplaced fracture. No additional fractures identified; however surrounding streak artifact from dental amalgam limits evaluation. No mandibular dislocation. No destructive process. Severe right TMJ degenerative change. Orbits: Negative. No traumatic or inflammatory finding. Sinuses: Mild mucosal thickening of the inferior left maxillary sinus and scattered ethmoid air cells.  No air-fluid levels. Soft tissues: Edema and soft tissue thickening along the right paramidline mandible anteriorly. No discrete drainable fluid collection identified; however, extensive streak artifact limits evaluation. Scattered periapical lucencies and dental caries involving the mandibular and maxillary teeth, including bilateral medial maxillary incisors and the left medial and lateral incisors. Limited intracranial: No significant or unexpected finding on limited evaluation. IMPRESSION: 1. Edema and soft tissue thickening along the right paramidline mandible anteriorly, suspicious for cellulitis/phlegmon in this patient with leukocytosis. No discrete drainable fluid collection identified; however, extensive streak artifact limits evaluation. 2. Small lucency through the anterior maxillary spine, suggestive of an age-indeterminant nondisplaced fracture. 3. Scattered periapical lucencies and dental caries involving the mandibular and maxillary teeth, including bilateral medial maxillary incisors and the left medial and lateral incisors. Findings discussed with Dr. Alfred Levins Via telephone at 3:35 PM Electronically Signed   By: Feliberto Harts MD   On: 12/01/2020 15:49   DG Chest Port 1 View  Result Date: 12/01/2020 CLINICAL DATA:  87 year old male with sepsis. EXAM: PORTABLE CHEST 1 VIEW COMPARISON:  Chest radiograph dated 09/03/2020. FINDINGS: Mild diffuse interstitial coarsening and biapical subpleural scarring. No focal consolidation, pleural effusion, or pneumothorax. Mild cardiomegaly. Atherosclerotic calcification of the aorta. Osteopenia with degenerative changes of the spine and left shoulder. No acute osseous pathology. IMPRESSION: No acute cardiopulmonary process. Electronically Signed   By: Elgie Collard M.D.   On: 12/01/2020 19:11   ECHOCARDIOGRAM COMPLETE  Result Date: 12/02/2020    ECHOCARDIOGRAM REPORT   Patient Name:   Matthew Greer Date of Exam: 12/02/2020 Medical Rec #:  [id number]       Height:       71.0 in Accession #:    [id number]     Weight:       149.9 lb Date of Birth:  03-03-1926       BSA:          1.865 m Patient Age:    87 years       BP:           115/76 mmHg Patient Gender: M              HR:           98 bpm. Exam Location:  [facility] Procedure: 2D Echo, Cardiac Doppler and Color Doppler Indications:     Atrial Fibrillation I48.91  History:         Patient has prior history of Echocardiogram examinations, most                  recent 04/11/2019. Stroke; Risk Factors:Hypertension.  Sonographer:     Cristela Blue RDCS (AE) Referring Phys:  [id number] AMY N COX Diagnosing Phys: Harold Hedge MD  Sonographer Comments: No apical window and Technically challenging study due to limited  acoustic windows. IMPRESSIONS  1. Left ventricular ejection fraction, by estimation, is 65 to 70%. The left ventricle has normal function. The left ventricle has no regional wall motion abnormalities. There is moderate left ventricular hypertrophy. Left ventricular diastolic parameters are consistent with Grade I diastolic dysfunction (impaired relaxation).  2. Right ventricular systolic function is normal. The right ventricular size is normal.  3. The mitral valve is grossly normal. Mild mitral valve regurgitation.  4. The aortic valve was not well visualized. Aortic valve regurgitation is trivial.  5. Aortic dilatation noted. There is borderline dilatation of the aortic root. FINDINGS  Left Ventricle: Left ventricular ejection fraction, by estimation, is 65 to 70%. The left ventricle has normal function. The left ventricle has no regional wall motion abnormalities. The left ventricular internal cavity size was normal in size. There is  moderate left ventricular hypertrophy. Left ventricular diastolic parameters are consistent with Grade I diastolic dysfunction (impaired relaxation). Right Ventricle: The right ventricular size is normal. No increase in right ventricular wall thickness. Right ventricular systolic function  is normal. Left Atrium: Left atrial size was normal in size. Right Atrium: Right atrial size was normal in size. Pericardium: There is no evidence of pericardial effusion. Mitral Valve: The mitral valve is grossly normal. Mild mitral valve regurgitation. Tricuspid Valve: The tricuspid valve is grossly normal. Tricuspid valve regurgitation is mild. Aortic Valve: The aortic valve was not well visualized. Aortic valve regurgitation is trivial. Pulmonic Valve: The pulmonic valve was not well visualized. Pulmonic valve regurgitation is trivial. Aorta: Aortic dilatation noted. There is borderline dilatation of the aortic root. IAS/Shunts: The interatrial septum was not well visualized.  LEFT VENTRICLE PLAX 2D LVIDd:         3.03 cm LVIDs:         1.89 cm LV PW:         1.08 cm LV IVS:        1.41 cm LVOT diam:     2.20 cm LVOT Area:     3.80 cm  LEFT ATRIUM         Index LA diam:    2.60 cm 1.39 cm/m                        PULMONIC VALVE AORTA                 PV Vmax:        0.51 m/s Ao Root diam: 4.30 cm PV Peak grad:   1.0 mmHg                       RVOT Peak grad: 2 mmHg  TRICUSPID VALVE TR Peak grad:   14.0 mmHg TR Vmax:        187.00 cm/s  SHUNTS Systemic Diam: 2.20 cm Harold Hedge MD Electronically signed by Harold Hedge MD Signature Date/Time: 12/02/2020/10:41:18 AM    Final    (Echo, Carotid, EGD, Colonoscopy, ERCP)    Subjective: Seen and examined at time of discharge.  Stable no distress.  Discharge plan explained at length.  Patient significant other and legal guardian at bedside.  Discharge Exam: Vitals:   12/04/20 0411 12/04/20 0834  BP: (!) 141/66 (!) 150/80  Pulse: 70 76  Resp: 18 18  Temp: 97.7 F (36.5 C) 98.2 F (36.8 C)  SpO2: 96% 96%   Vitals:   12/03/20 1427 12/03/20 1919 12/04/20 0411 12/04/20 0834  BP: 122/65 137/67 (!) 141/66 Marland Kitchen)  150/80  Pulse: 73 71 70 76  Resp: 17 19 18 18   Temp: 98.1 F (36.7 C) 98.5 F (36.9 C) 97.7 F (36.5 C) 98.2 F (36.8 C)  TempSrc:  Oral Oral    SpO2: 97% 98% 96% 96%  Weight:      Height:        General: Pt is alert, awake, not in acute distress Cardiovascular: RRR, S1/S2 +, no rubs, no gallops Respiratory: CTA bilaterally, no wheezing, no rhonchi Abdominal: Soft, NT, ND, bowel sounds + Extremities: no edema, no cyanosis    The results of significant diagnostics from this hospitalization (including imaging, microbiology, ancillary and laboratory) are listed below for reference.     Microbiology: Recent Results (from the past 240 hour(s))  Culture, blood (routine x 2)     Status: None (Preliminary result)   Collection Time: 12/01/20  4:01 PM   Specimen: BLOOD  Result Value Ref Range Status   Specimen Description BLOOD BLOOD RIGHT FOREARM  Final   Special Requests   Final    BOTTLES DRAWN AEROBIC AND ANAEROBIC Blood Culture results may not be optimal due to an excessive volume of blood received in culture bottles   Culture   Final    NO GROWTH 3 DAYS Performed at [facility], [address] [place], [place] [id number]    Report Status PENDING  Incomplete  Culture, blood (routine x 2)     Status: None (Preliminary result)   Collection Time: 12/01/20  4:01 PM   Specimen: BLOOD  Result Value Ref Range Status   Specimen Description BLOOD RIGHT ANTECUBITAL  Final   Special Requests   Final    BOTTLES DRAWN AEROBIC AND ANAEROBIC Blood Culture results may not be optimal due to an excessive volume of blood received in culture bottles   Culture   Final    NO GROWTH 3 DAYS Performed at [facility], [address] [place], [place] [address]    Report Status PENDING  Incomplete  Resp Panel by RT-PCR (Flu A&B, Covid) Nasopharyngeal Swab     Status: None   Collection Time: 12/01/20  4:01 PM   Specimen: Nasopharyngeal Swab; Nasopharyngeal(NP) swabs in vial transport medium  Result Value Ref Range Status   SARS Coronavirus 2 by RT PCR NEGATIVE NEGATIVE Final    Comment: (NOTE) SARS-CoV-2 target nucleic  acids are NOT DETECTED.  The SARS-CoV-2 RNA is generally detectable in upper respiratory specimens during the acute phase of infection. The lowest concentration of SARS-CoV-2 viral copies this assay can detect is 138 copies/mL. A negative result does not preclude SARS-Cov-2 infection and should not be used as the sole basis for treatment or other patient management decisions. A negative result may occur with  improper specimen collection/handling, submission of specimen other than nasopharyngeal swab, presence of viral mutation(s) within the areas targeted by this assay, and inadequate number of viral copies(<138 copies/mL). A negative result must be combined with clinical observations, patient history, and epidemiological information. The expected result is Negative.  Fact Sheet for Patients:  BloggerCourse.comhttps://www.fda.gov/media/152166/download  Fact Sheet for Healthcare Providers:  SeriousBroker.ithttps://www.fda.gov/media/152162/download  This test is no t yet approved or cleared by the [place] FDA and  has been authorized for detection and/or diagnosis of SARS-CoV-2 by FDA under an Emergency Use Authorization (EUA). This EUA will remain  in effect (meaning this test can be used) for the duration of the COVID-19 declaration under Section 564(b)(1) of the Act, 21 U.S.C.section 360bbb-3(b)(1), unless the authorization is  terminated  or revoked sooner.       Influenza A by PCR NEGATIVE NEGATIVE Final   Influenza B by PCR NEGATIVE NEGATIVE Final    Comment: (NOTE) The Xpert Xpress SARS-CoV-2/FLU/RSV plus assay is intended as an aid in the diagnosis of influenza from Nasopharyngeal swab specimens and should not be used as a sole basis for treatment. Nasal washings and aspirates are unacceptable for Xpert Xpress SARS-CoV-2/FLU/RSV testing.  Fact Sheet for Patients: BloggerCourse.com  Fact Sheet for Healthcare Providers: SeriousBroker.it  This  test is not yet approved or cleared by the [place] FDA and has been authorized for detection and/or diagnosis of SARS-CoV-2 by FDA under an Emergency Use Authorization (EUA). This EUA will remain in effect (meaning this test can be used) for the duration of the COVID-19 declaration under Section 564(b)(1) of the Act, 21 U.S.C. section 360bbb-3(b)(1), unless the authorization is terminated or revoked.  Performed at [facility], [address] [place], [place] [address]      Labs: BNP (last 3 results) Recent Labs    09/02/20 0940  BNP 104.8*   Basic Metabolic Panel: Recent Labs  Lab 12/01/20 1149 12/02/20 0728 12/03/20 0434 12/04/20 0359  NA 139 142 141 138  K 3.7 3.8 3.5 3.7  CL 110 109 112* 109  CO2 23 22 23 22   GLUCOSE 161* 104* 93 95  BUN 26* 24* 26* 26*  CREATININE 1.45* 1.33* 1.59* 1.51*  CALCIUM 9.2 9.1 8.3* 8.6*  MG  --   --  1.8  --    Liver Function Tests: Recent Labs  Lab 12/02/20 0728  AST 18  ALT 15  ALKPHOS 92  BILITOT 0.8  PROT 6.7  ALBUMIN 3.5   No results for input(s): LIPASE, AMYLASE in the last 168 hours. No results for input(s): AMMONIA in the last 168 hours. CBC: Recent Labs  Lab 12/01/20 1149 12/02/20 0728 12/03/20 0434  WBC 22.0* 14.3* 8.5  NEUTROABS  --   --  5.7  HGB 10.5* 10.6* 8.7*  HCT 33.4* 32.4* 26.8*  MCV 85.2 82.7 82.0  PLT 320 266 238   Cardiac Enzymes: No results for input(s): CKTOTAL, CKMB, CKMBINDEX, TROPONINI in the last 168 hours. BNP: Invalid input(s): POCBNP CBG: Recent Labs  Lab 12/02/20 0150  GLUCAP 97   D-Dimer No results for input(s): DDIMER in the last 72 hours. Hgb A1c No results for input(s): HGBA1C in the last 72 hours. Lipid Profile No results for input(s): CHOL, HDL, LDLCALC, TRIG, CHOLHDL, LDLDIRECT in the last 72 hours. Thyroid function studies No results for input(s): TSH, T4TOTAL, T3FREE, THYROIDAB in the last 72 hours.  Invalid input(s): FREET3 Anemia work up No  results for input(s): VITAMINB12, FOLATE, FERRITIN, TIBC, IRON, RETICCTPCT in the last 72 hours. Urinalysis    Component Value Date/Time   COLORURINE YELLOW (A) 12/01/2020 1602   APPEARANCEUR CLOUDY (A) 12/01/2020 1602   APPEARANCEUR Cloudy (A) 10/17/2019 1042   LABSPEC 1.025 12/01/2020 1602   LABSPEC 1.010 07/12/2011 1236   PHURINE 6.0 12/01/2020 1602   GLUCOSEU NEGATIVE 12/01/2020 1602   GLUCOSEU Negative 07/12/2011 1236   HGBUR SMALL (A) 12/01/2020 1602   BILIRUBINUR NEGATIVE 12/01/2020 1602   BILIRUBINUR Negative 10/17/2019 1042   BILIRUBINUR Negative 07/12/2011 1236   KETONESUR NEGATIVE 12/01/2020 1602   PROTEINUR 30 (A) 12/01/2020 1602   NITRITE POSITIVE (A) 12/01/2020 1602   LEUKOCYTESUR LARGE (A) 12/01/2020 1602   LEUKOCYTESUR Negative 07/12/2011 1236   Sepsis Labs Invalid input(s): PROCALCITONIN,  WBC,  LACTICIDVEN Microbiology Recent Results (from the past 240 hour(s))  Culture, blood (routine x 2)     Status: None (Preliminary result)   Collection Time: 12/01/20  4:01 PM   Specimen: BLOOD  Result Value Ref Range Status   Specimen Description BLOOD BLOOD RIGHT FOREARM  Final   Special Requests   Final    BOTTLES DRAWN AEROBIC AND ANAEROBIC Blood Culture results may not be optimal due to an excessive volume of blood received in culture bottles   Culture   Final    NO GROWTH 3 DAYS Performed at [facility], [address], [place], [place] [address]    Report Status PENDING  Incomplete  Culture, blood (routine x 2)     Status: None (Preliminary result)   Collection Time: 12/01/20  4:01 PM   Specimen: BLOOD  Result Value Ref Range Status   Specimen Description BLOOD RIGHT ANTECUBITAL  Final   Special Requests   Final    BOTTLES DRAWN AEROBIC AND ANAEROBIC Blood Culture results may not be optimal due to an excessive volume of blood received in culture bottles   Culture   Final    NO GROWTH 3 DAYS Performed at [facility], [address] [place], [place] [address]    Report Status PENDING  Incomplete  Resp Panel by RT-PCR (Flu A&B, Covid) Nasopharyngeal Swab     Status: None   Collection Time: 12/01/20  4:01 PM   Specimen: Nasopharyngeal Swab; Nasopharyngeal(NP) swabs in vial transport medium  Result Value Ref Range Status   SARS Coronavirus 2 by RT PCR NEGATIVE NEGATIVE Final    Comment: (NOTE) SARS-CoV-2 target nucleic acids are NOT DETECTED.  The SARS-CoV-2 RNA is generally detectable in upper respiratory specimens during the acute phase of infection. The lowest concentration of SARS-CoV-2 viral copies this assay can detect is 138 copies/mL. A negative result does not preclude SARS-Cov-2 infection and should not be used as the sole basis for treatment or other patient management decisions. A negative result may occur with  improper specimen collection/handling, submission of specimen other than nasopharyngeal swab, presence of viral mutation(s) within the areas targeted by this assay, and inadequate number of viral copies(<138 copies/mL). A negative result must be combined with clinical observations, patient history, and epidemiological information. The expected result is Negative.  Fact Sheet for Patients:  BloggerCourse.com  Fact Sheet for Healthcare Providers:  SeriousBroker.it  This test is no t yet approved or cleared by the [place] FDA and  has been authorized for detection and/or diagnosis of SARS-CoV-2 by FDA under an Emergency Use Authorization (EUA). This EUA will remain  in effect (meaning this test can be used) for the duration of the COVID-19 declaration under Section 564(b)(1) of the Act, 21 U.S.C.section 360bbb-3(b)(1), unless the authorization is terminated  or revoked sooner.       Influenza A by PCR NEGATIVE NEGATIVE Final   Influenza B by PCR NEGATIVE NEGATIVE Final    Comment: (NOTE) The Xpert Xpress SARS-CoV-2/FLU/RSV plus assay  is intended as an aid in the diagnosis of influenza from Nasopharyngeal swab specimens and should not be used as a sole basis for treatment. Nasal washings and aspirates are unacceptable for Xpert Xpress SARS-CoV-2/FLU/RSV testing.  Fact Sheet for Patients: BloggerCourse.com  Fact Sheet for Healthcare Providers: SeriousBroker.it  This test is not yet approved or cleared by the [place] FDA and has been authorized for detection and/or diagnosis of SARS-CoV-2 by FDA under an Emergency Use Authorization (EUA).  This EUA will remain in effect (meaning this test can be used) for the duration of the COVID-19 declaration under Section 564(b)(1) of the Act, 21 U.S.C. section 360bbb-3(b)(1), unless the authorization is terminated or revoked.  Performed at [facility], [address] [place], [place] [address]      Time coordinating discharge: Over 30 minutes  SIGNED:   Tresa Moore, MD  [facility] 12/04/2020, 1:29 PM Pager   If 7PM-7AM, please contact night-coverage

## 2020-12-06 LAB — CULTURE, BLOOD (ROUTINE X 2)
Culture: NO GROWTH
Culture: NO GROWTH

## 2021-08-09 ENCOUNTER — Ambulatory Visit: Payer: Medicare Other | Admitting: Urology

## 2021-09-05 NOTE — Progress Notes (Signed)
? ? ?09/06/2021 ?3:15 PM  ? ?Matthew Greer ?[birth year] ?[id number] ? ?Referring provider: Hortencia Pilar, MD ?[address] ?[place],  [place] [address] ? ?Chief Complaint  ?Patient presents with  ? Follow-up  ? ?Urological history: ?1. BPH with retention ?-managed with CIC x 3  ? ?HPI: ?Matthew Greer is a 88 y.o. male who presents today for a yearly visit with his spouse, Matthew Greer. ? ?He is doing well with his self cathing.  He states his spouse is having to help him more often, but the actual cathing is smooth. ? ?He is getting good urine return and has not had episodes of gross hematuria or UTIs. ? ?He is cathing 3 times daily with some episodes of spontaneous voids. ? ?Patient denies any modifying or aggravating factors.  Patient denies any gross hematuria, dysuria or suprapubic/flank pain.  Patient denies any fevers, chills, nausea or vomiting.   ? ? ?PMH: ?Past Medical History:  ?Diagnosis Date  ? Hypertension   ? Stroke [facility]) 2013  ? "minor" - no deficits  ? ? ?Surgical History: ?Past Surgical History:  ?Procedure Laterality Date  ? ESOPHAGEAL DILATION  02/24/2016  ? Procedure: ESOPHAGEAL DILATION;  Surgeon: Lucilla Lame, MD;  Location: [place];  Service: Endoscopy;;  ? ESOPHAGOGASTRODUODENOSCOPY N/A 09/03/2020  ? Procedure: ESOPHAGOGASTRODUODENOSCOPY (EGD);  Surgeon: Toledo, Benay Pike, MD;  Location: [facility];  Service: Gastroenterology;  Laterality: N/A;  ? ESOPHAGOGASTRODUODENOSCOPY (EGD) WITH PROPOFOL N/A 02/24/2016  ? Procedure: ESOPHAGOGASTRODUODENOSCOPY (EGD) WITH PROPOFOL;  Surgeon: Lucilla Lame, MD;  Location: [place];  Service: Endoscopy;  Laterality: N/A;  ? ESOPHAGOGASTRODUODENOSCOPY ENDOSCOPY  03/2013  ? Dr. Allen Norris  ? FLEXIBLE SIGMOIDOSCOPY N/A 09/03/2020  ? Procedure: FLEXIBLE SIGMOIDOSCOPY;  Surgeon: Toledo, Benay Pike, MD;  Location: [facility];  Service: Gastroenterology;  Laterality: N/A;  ? IVC FILTER INSERTION N/A 09/04/2020  ? Procedure: IVC FILTER INSERTION;   Surgeon: Serafina Mitchell, MD;  Location: [place] [facility];  Service: Cardiovascular;  Laterality: N/A;  ? THORACOTOMY  1953  ? ? ?Home Medications:  ?Allergies as of 09/06/2021   ? ?   Reactions  ? Cephalexin Other (See Comments)  ? weakness  ? Chlorhexidine   ? Finasteride Nausea Only, Other (See Comments)  ? Reaction:  Confusion   ? Levofloxacin Other (See Comments)  ? Lisinopril Other (See Comments)  ? Reaction:  Confusion  ? ?  ? ?  ?Medication List  ?  ? ?  ? Accurate as of September 06, 2021  3:15 PM. If you have any questions, ask your nurse or doctor.  ?  ?  ? ?  ? ?acetaminophen 325 MG tablet ?Commonly known as: TYLENOL ?Take 325-650 mg by mouth every 6 (six) hours as needed for mild pain, moderate pain, fever or headache. ?  ?amLODipine 5 MG tablet ?Commonly known as: NORVASC ?Take 1 tablet (5 mg total) by mouth daily. ?  ?aspirin 81 MG EC tablet ?Take 1 tablet (81 mg total) by mouth daily. ?  ?atorvastatin 10 MG tablet ?Commonly known as: LIPITOR ?Take 10 mg by mouth every evening. ?  ?clopidogrel 75 MG tablet ?Commonly known as: PLAVIX ?Take 75 mg by mouth daily. ?  ?famotidine 40 MG tablet ?Commonly known as: PEPCID ?Take 40 mg by mouth 2 (two) times daily. ?  ?HYDROcodone-acetaminophen 5-325 MG tablet ?Commonly known as: NORCO/VICODIN ?Take 1 tablet by mouth every 6 (six) hours as needed for severe pain. ?  ?loratadine 10 MG tablet ?Commonly  known as: CLARITIN ?Take 10 mg by mouth daily. ?  ?losartan 25 MG tablet ?Commonly known as: COZAAR ?Take 25 mg by mouth daily. ?  ?metoprolol tartrate 50 MG tablet ?Commonly known as: LOPRESSOR ?Take 1 tablet (50 mg total) by mouth 2 (two) times daily. ?  ?pantoprazole 40 MG tablet ?Commonly known as: Protonix ?Take 1 tablet (40 mg total) by mouth 2 (two) times daily. Acid reflux medication to help your esophagus heal. ?  ?Stool Softener 100 MG capsule ?Generic drug: docusate sodium ?  ? ?  ? ? ?Allergies:  ?Allergies  ?Allergen Reactions  ? Cephalexin Other (See  Comments)  ?  weakness  ? Chlorhexidine   ? Finasteride Nausea Only and Other (See Comments)  ?  Reaction:  Confusion   ? Levofloxacin Other (See Comments)  ? Lisinopril Other (See Comments)  ?  Reaction:  Confusion  ? ? ?Family History: ?No family history on file. ? ?Social History:  reports that he has quit smoking. He has never used smokeless tobacco. He reports that he does not drink alcohol and does not use drugs. ? ?ROS: ?Pertinent ROS in HPI ? ?Physical Exam: ?BP (!) 164/74   Pulse 70   Ht _0  (1.753 m)   Wt 152 lb (68.9 kg)   BMI 22.45 kg/m?   ?Constitutional:  Well nourished. Alert and oriented, No acute distress. ?HEENT: [place] AT, moist mucus membranes.  Trachea midline ?Cardiovascular: No clubbing, cyanosis, or edema. ?Respiratory: Normal respiratory effort, no increased work of breathing. ?Neurologic: Grossly intact, no focal deficits, moving all 4 extremities.  Right hand contracted. ?Psychiatric: Normal mood and affect. ? ?Laboratory Data: ?Glucose 65 - 99 mg/dL 100 High                                             ?                                                   Fasting reference interval  ?                                          ?     For someone without known diabetes, a glucose value  ?     between 100 and 125 mg/dL is consistent with  ?     prediabetes and should be confirmed with a  ?     follow-up test.  ?        ?BUN 7 - 25 mg/dL 20    ?Creatinine 0.70 - 1.22 mg/dL 1.48 High     ?eGFR CKD-EPI CR 2021 > OR = 60 mL/min/1.48m 44 Low        The eGFR is based on the CKD-EPI 2021 equation. To calculate  ?     the new eGFR from a previous Creatinine or Cystatin C  ?     result, go to https://www.kidney.org/professionals/  ?     [email or web address]  ?BUN/Creatinine Ratio 6 - 22 (calc) 14    ?Sodium 135 - 146 mmol/L 140    ?Potassium 3.5 - 5.3 mmol/L 4.3    ?Chloride  98 - 110 mmol/L 107    ?Bicarbonate (CO2) 20 - 32 mmol/L 25    ?Calcium 8.6 - 10.3 mg/dL 9.2    ?Phosphorus 2.1 - 4.3 mg/dL 3.4     ?Albumin 3.6 - 5.1 g/dL 3.6    ?[place]   ?Narrative ?Performed by [facility] ?PATIENT UNABLE TO VOID; ADVISED TO RETURN FOR COLLECTION. ?Resulting Agency Comment ? ?Performing Organization Information:  ?    Site ID: [id number]  ?    Name: [facility]  ?    Address: [address] [place], [place] [address]  ?    Director: Cathleen Fears Hessling ?Specimen Collected: 07/11/21 14:25 Last Resulted: 07/12/21 11:24  ?Received From: [facility]  Result Received: 08/05/21 09:54  ? ?WBC 3.8 - 10.8 Thousand/uL 6.1    ?RBC 4.20 - 5.80 Million/uL 4.15 Low     ?Hemoglobin 13.2 - 17.1 g/dL 11.3 Low     ?Hematocrit 38.5 - 50.0 % 36.1 Low     ?MCV 80.0 - 100.0 fL 87.0    ?MCH 27.0 - 33.0 pg 27.2    ?MCHC 32.0 - 36.0 g/dL 31.3 Low     ?RDW 11.0 - 15.0 % 14.0    ?Platelets 140 - 400 Thousand/uL 338    ?MPV 7.5 - 12.5 fL 10.9    ?Neutrophils Absolute 1500 - 7800 cells/uL 3,160    ?Band Neutrophils Absolute, Manual Count 0 - 750 cells/uL CANCELED  Result canceled by the ancillary.  ?Metamyelocytes Absolute 0 cells/uL CANCELED  Result canceled by the ancillary.  ?Absolute Myelocytes 0 cells/uL CANCELED  Result canceled by the ancillary.  ?Absolute Promyelocytes 0 cells/uL CANCELED  Result canceled by the ancillary.  ?Lymphocytes Absolute 850 - 3900 cells/uL 1,940    ?Monocytes Absolute 200 - 950 cells/uL 634    ?Eosinophils Absolute 15 - 500 cells/uL 336    ?Basophils Absolute 0 - 200 cells/uL 31    ?Blasts Absolute 0 cells/uL CANCELED  Result canceled by the ancillary.  ?NRBC Absolute 0 cells/uL CANCELED  Result canceled by the ancillary.  ?Neutrophils Relative % 51.8    ?Bands Absolute % CANCELED  Result canceled by the ancillary.  ?Metamyelocytes Percent % CANCELED  Result canceled by the ancillary.  ?Myelocytes Relative % CANCELED  Result canceled by the ancillary.  ?Promyelocytes Relative % CANCELED  Result canceled by the ancillary.  ?Lymphocytes % 31.8    ?Variant  lymphocytes/100 WBC (Bld) 0 - 10 % CANCELED  Result canceled by the ancillary.  ?Monocytes % 10.4    ?Eosinophils % 5.5    ?Basophils Relative % 0.5    ?Blasts % CANCELED  Result canceled by the ancillary.  ?nRBC 0 /100

## 2021-09-06 ENCOUNTER — Ambulatory Visit (INDEPENDENT_AMBULATORY_CARE_PROVIDER_SITE_OTHER): Payer: Medicare Other | Admitting: Urology

## 2021-09-06 ENCOUNTER — Encounter: Payer: Self-pay | Admitting: Urology

## 2021-09-06 VITALS — BP 164/74 | HR 70 | Ht 69.0 in | Wt 152.0 lb

## 2021-09-06 DIAGNOSIS — R338 Other retention of urine: Secondary | ICD-10-CM

## 2021-09-06 DIAGNOSIS — N401 Enlarged prostate with lower urinary tract symptoms: Secondary | ICD-10-CM

## 2021-12-12 ENCOUNTER — Emergency Department
Admission: EM | Admit: 2021-12-12 | Discharge: 2021-12-12 | Disposition: A | Discharge status: Home / Self Care | Payer: Medicare Other | Attending: Student in an Organized Health Care Education/Training Program | Admitting: Student in an Organized Health Care Education/Training Program

## 2021-12-12 ENCOUNTER — Other Ambulatory Visit: Payer: Self-pay

## 2021-12-12 DIAGNOSIS — S8991XA Unspecified injury of right lower leg, initial encounter: Secondary | ICD-10-CM | POA: Diagnosis present

## 2021-12-12 DIAGNOSIS — W548XXA Other contact with dog, initial encounter: Secondary | ICD-10-CM

## 2021-12-12 DIAGNOSIS — Z23 Encounter for immunization: Secondary | ICD-10-CM | POA: Insufficient documentation

## 2021-12-12 DIAGNOSIS — W540XXA Bitten by dog, initial encounter: Secondary | ICD-10-CM | POA: Diagnosis not present

## 2021-12-12 DIAGNOSIS — Y9389 Activity, other specified: Secondary | ICD-10-CM | POA: Diagnosis not present

## 2021-12-12 DIAGNOSIS — S51832A Puncture wound without foreign body of left forearm, initial encounter: Secondary | ICD-10-CM | POA: Diagnosis not present

## 2021-12-12 DIAGNOSIS — Y9289 Other specified places as the place of occurrence of the external cause: Secondary | ICD-10-CM | POA: Insufficient documentation

## 2021-12-12 DIAGNOSIS — S81831A Puncture wound without foreign body, right lower leg, initial encounter: Secondary | ICD-10-CM | POA: Insufficient documentation

## 2021-12-12 MED ORDER — LIDOCAINE-EPINEPHRINE 2 %-1:100000 IJ SOLN
20.0000 mL | Freq: Once | INTRAMUSCULAR | Status: AC
  Administered 2021-12-12: 20 mL
  Filled 2021-12-12: qty 1

## 2021-12-12 MED ORDER — AMOXICILLIN-POT CLAVULANATE 875-125 MG PO TABS: 1.0000 | ORAL_TABLET | Freq: Two times a day (BID) | ORAL | 0 refills | Status: AC

## 2021-12-12 MED ORDER — TETANUS-DIPHTH-ACELL PERTUSSIS 5-2.5-18.5 LF-MCG/0.5 IM SUSY
0.5000 mL | PREFILLED_SYRINGE | Freq: Once | INTRAMUSCULAR | Status: AC
  Administered 2021-12-12: 0.5 mL via INTRAMUSCULAR
  Filled 2021-12-12: qty 0.5

## 2021-12-12 NOTE — ED Triage Notes (Signed)
Pt comes with c/o dog bite to left arm and right leg. Pt was at dog kennel about to adopt a dog and he was holding the dog. Pt states it got his leg first and then jumped up on him and got him.  Bandage in place and bleeding controlled at this time. Per pt dog has had shots and up to date.

## 2021-12-12 NOTE — Discharge Instructions (Signed)
Take Augmentin twice daily for seven days.  

## 2021-12-12 NOTE — ED Provider Triage Note (Signed)
Emergency Medicine Provider Triage Evaluation Note  LION FERNANDEZ , a 88 y.o. male  was evaluated in triage.  Pt complains of dog scratch. Patient was at the shelter to adopt a dog and the dog jumped up and scratched his hand. Patient reports dog up to date on vaccinations. Sent to ER for tetanus. Patient on plavix.  Review of Systems  Positive: Laceration to left hand Negative: Paresthesias, weakness  Physical Exam  There were no vitals taken for this visit. Gen:   Awake, no distress   Resp:  Normal effort  MSK:   Moves extremities without difficulty  Other:  2cm laceration to dorsum of left forearm, oozing  Medical Decision Making  Medically screening exam initiated at 6:31 PM.  Appropriate orders placed.  [name] Kunzler was informed that the remainder of the evaluation will be completed by another provider, this initial triage assessment does not replace that evaluation, and the importance of remaining in the ED until their evaluation is complete.    Jackelyn Hoehn, PA-C 12/12/21 1836

## 2021-12-19 NOTE — ED Provider Notes (Addendum)
[facility] Provider Note  Patient Contact: 5:39 PM (approximate)   History   Animal Bite   HPI  Matthew Greer is a 88 y.o. male presents to the emergency department after patient sustained scratches and possible dog bite wounds to left arm and right leg.  Patient was trying to adopt a dog from a local kennel and dog jumped up on him.  This is up-to-date.  Patient unsure of his last tetanus shot.      Physical Exam   Triage Vital Signs: ED Triage Vitals  Enc Vitals Group     BP 12/12/21 1841 (!) 179/82     Pulse Rate 12/12/21 1841 65     Resp 12/12/21 1841 14     Temp 12/12/21 1841 98 F (36.7 C)     Temp Source 12/12/21 1841 Oral     SpO2 12/12/21 1841 98 %     Weight 12/12/21 1842 150 lb (68 kg)     Height 12/12/21 1842 5\' 11"  (1.803 m)     Head Circumference --      Peak Flow --      Pain Score 12/12/21 1852 3     Pain Loc --      Pain Edu? --      Excl. in GC? --     Most recent vital signs: Vitals:   12/12/21 1841  BP: (!) 179/82  Pulse: 65  Resp: 14  Temp: 98 F (36.7 C)  SpO2: 98%     General: Alert and in no acute distress. Eyes:  PERRL. EOMI. Head: No acute traumatic findings ENT:      Nose: No congestion/rhinnorhea.      Mouth/Throat: Mucous membranes are moist.  Neck: No stridor. No cervical spine tenderness to palpation. Cardiovascular:  Good peripheral perfusion Respiratory: Normal respiratory effort without tachypnea or retractions. Lungs CTAB. Good air entry to the bases with no decreased or absent breath sounds. Gastrointestinal: Bowel sounds 4 quadrants. Soft and nontender to palpation. No guarding or rigidity. No palpable masses. No distention. No CVA tenderness. Musculoskeletal: Full range of motion to all extremities.  Neurologic:  No gross focal neurologic deficits are appreciated.  Skin: Patient has scratch marks/puncture wounds along the posterior aspect of the right leg and left arm. Other:   ED  Results / Procedures / Treatments   Labs (all labs ordered are listed, but only abnormal results are displayed) Labs Reviewed - No data to display     PROCEDURES:  Critical Care performed: No  Procedures   MEDICATIONS ORDERED IN ED: Medications  lidocaine-EPINEPHrine (XYLOCAINE W/EPI) 2 %-1:100000 (with pres) injection 20 mL (20 mLs Infiltration Given by Other 12/12/21 2155)  Tdap (BOOSTRIX) injection 0.5 mL (0.5 mLs Intramuscular Given 12/12/21 2219)     IMPRESSION / MDM / ASSESSMENT AND PLAN / ED COURSE  I reviewed the triage vital signs and the nursing notes.                              Assessment and plan Dog bite wound 88 year old male presents to the emergency department with dog bite wounds/scratches along upper and lower extremities.  Wounds were irrigated copiously and dressings were applied.  Patient's tetanus status was updated in the emergency department and patient was discharged with Augmentin.  Patient declined to initiate rabies vaccine series as dog is up-to-date on rabies shots.      FINAL CLINICAL IMPRESSION(S) /  ED DIAGNOSES   Final diagnoses:  Dog scratch     Rx / DC Orders   ED Discharge Orders          Ordered    amoxicillin-clavulanate (AUGMENTIN) 875-125 MG tablet  2 times daily        12/12/21 2207             Note:  This document was prepared using Dragon voice recognition software and may include unintentional dictation errors.   Pia Mau Turton, PA-C 12/19/21 1741    Orvil Feil, PA-C 12/27/21 2035    Willy Eddy, MD 12/29/21 2039

## 2022-07-05 ENCOUNTER — Ambulatory Visit: Payer: Medicare Other | Admitting: Dermatology

## 2022-09-07 ENCOUNTER — Ambulatory Visit (INDEPENDENT_AMBULATORY_CARE_PROVIDER_SITE_OTHER): Payer: Medicare Other | Admitting: Physician Assistant

## 2022-09-07 VITALS — BP 146/77 | HR 84 | Ht 71.0 in | Wt 150.0 lb

## 2022-09-07 DIAGNOSIS — R338 Other retention of urine: Secondary | ICD-10-CM

## 2022-09-07 DIAGNOSIS — N401 Enlarged prostate with lower urinary tract symptoms: Secondary | ICD-10-CM | POA: Diagnosis not present

## 2022-09-07 NOTE — Progress Notes (Signed)
09/07/2022 2:27 PM   [name] 1925-06-09 [id number]  CC: Chief Complaint  Patient presents with   Follow-up   HPI: Matthew Greer is a 89 y.o. male with PMH BPH with urinary with tension managed with CIC 3 times daily who presents today for annual follow-up.  He is accompanied today by his spouse, [name], who contributes to HPI.  Today they report they continued to perform intermittent self-catheterization 3 times daily.  They have no concerns associated with this.  They deny difficulty with catheter insertion, gross hematuria, or UTIs over the past year.  Overall they are very pleased, their current catheters are working well for them, and they wish to continue this regimen.  PMH: Past Medical History:  Diagnosis Date   Hypertension    Stroke [facility]) 2013   "minor" - no deficits    Surgical History: Past Surgical History:  Procedure Laterality Date   ESOPHAGEAL DILATION  02/24/2016   Procedure: ESOPHAGEAL DILATION;  Surgeon: Lucilla Lame, MD;  Location: [place];  Service: Endoscopy;;   ESOPHAGOGASTRODUODENOSCOPY N/A 09/03/2020   Procedure: ESOPHAGOGASTRODUODENOSCOPY (EGD);  Surgeon: Toledo, Benay Pike, MD;  Location: [facility];  Service: Gastroenterology;  Laterality: N/A;   ESOPHAGOGASTRODUODENOSCOPY (EGD) WITH PROPOFOL N/A 02/24/2016   Procedure: ESOPHAGOGASTRODUODENOSCOPY (EGD) WITH PROPOFOL;  Surgeon: Lucilla Lame, MD;  Location: [place];  Service: Endoscopy;  Laterality: N/A;   ESOPHAGOGASTRODUODENOSCOPY ENDOSCOPY  03/2013   Dr. Allen Norris   FLEXIBLE SIGMOIDOSCOPY N/A 09/03/2020   Procedure: FLEXIBLE SIGMOIDOSCOPY;  Surgeon: Toledo, Benay Pike, MD;  Location: [facility];  Service: Gastroenterology;  Laterality: N/A;   IVC FILTER INSERTION N/A 09/04/2020   Procedure: IVC FILTER INSERTION;  Surgeon: Serafina Mitchell, MD;  Location: [place] [facility];  Service: Cardiovascular;  Laterality: N/A;   THORACOTOMY  1953    Home Medications:  Allergies  as of 09/07/2022       Reactions   Cephalexin Other (See Comments)   weakness   Chlorhexidine    Finasteride Nausea Only, Other (See Comments)   Reaction:  Confusion    Levofloxacin Other (See Comments)   Lisinopril Other (See Comments)   Reaction:  Confusion        Medication List        Accurate as of September 07, 2022  2:27 PM. If you have any questions, ask your nurse or doctor.          acetaminophen 325 MG tablet Commonly known as: TYLENOL Take 325-650 mg by mouth every 6 (six) hours as needed for mild pain, moderate pain, fever or headache.   amLODipine 5 MG tablet Commonly known as: NORVASC Take 1 tablet (5 mg total) by mouth daily.   aspirin EC 81 MG tablet Take 1 tablet (81 mg total) by mouth daily.   atorvastatin 10 MG tablet Commonly known as: LIPITOR Take 10 mg by mouth every evening.   clopidogrel 75 MG tablet Commonly known as: PLAVIX Take 75 mg by mouth daily.   famotidine 40 MG tablet Commonly known as: PEPCID Take 40 mg by mouth 2 (two) times daily.   HYDROcodone-acetaminophen 5-325 MG tablet Commonly known as: NORCO/VICODIN Take 1 tablet by mouth every 6 (six) hours as needed for severe pain.   loratadine 10 MG tablet Commonly known as: CLARITIN Take 10 mg by mouth daily.   losartan 25 MG tablet Commonly known as: COZAAR Take 25 mg by mouth daily.   metoprolol tartrate 50 MG tablet Commonly known as: LOPRESSOR Take 1  tablet (50 mg total) by mouth 2 (two) times daily.   pantoprazole 40 MG tablet Commonly known as: Protonix Take 1 tablet (40 mg total) by mouth 2 (two) times daily. Acid reflux medication to help your esophagus heal.   Stool Softener 100 MG capsule Generic drug: docusate sodium        Allergies:  Allergies  Allergen Reactions   Cephalexin Other (See Comments)    weakness   Chlorhexidine    Finasteride Nausea Only and Other (See Comments)    Reaction:  Confusion    Levofloxacin Other (See Comments)    Lisinopril Other (See Comments)    Reaction:  Confusion    Family History: No family history on file.  Social History:   reports that he has quit smoking. He has never used smokeless tobacco. He reports that he does not drink alcohol and does not use drugs.  Physical Exam: BP (!) 146/77   Pulse 84   Constitutional:  Alert and oriented, no acute distress, nontoxic appearing HEENT: [place], AT Cardiovascular: No clubbing, cyanosis, or edema Respiratory: Normal respiratory effort, no increased work of breathing Skin: No rashes, bruises or suspicious lesions Neurologic: In wheelchair Psychiatric: Normal mood and affect  Assessment & Plan:   1. 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  Well-managed with CIC 3 times daily.  No gross hematuria or infections this past year.  Will continue CIC 3 times daily.  Return in about 1 year (around 09/07/2023) for Annual follow-up.  Debroah Loop, PA-C  [facility] [place] [address], [place], [place] [address] [phone]

## 2023-09-06 ENCOUNTER — Ambulatory Visit: Payer: Self-pay | Admitting: Physician Assistant

## 2023-11-30 ENCOUNTER — Other Ambulatory Visit: Payer: Self-pay

## 2023-11-30 ENCOUNTER — Emergency Department

## 2023-11-30 ENCOUNTER — Inpatient Hospital Stay
Admission: EM | Admit: 2023-11-30 | Discharge: 2023-12-03 | DRG: 872 | Disposition: A | Discharge status: Home / Self Care | Attending: Internal Medicine | Admitting: Internal Medicine

## 2023-11-30 DIAGNOSIS — I10 Essential (primary) hypertension: Secondary | ICD-10-CM | POA: Diagnosis present

## 2023-11-30 DIAGNOSIS — I69351 Hemiplegia and hemiparesis following cerebral infarction affecting right dominant side: Secondary | ICD-10-CM

## 2023-11-30 DIAGNOSIS — Z87891 Personal history of nicotine dependence: Secondary | ICD-10-CM

## 2023-11-30 DIAGNOSIS — Z7982 Long term (current) use of aspirin: Secondary | ICD-10-CM

## 2023-11-30 DIAGNOSIS — N390 Urinary tract infection, site not specified: Secondary | ICD-10-CM | POA: Diagnosis not present

## 2023-11-30 DIAGNOSIS — I4891 Unspecified atrial fibrillation: Secondary | ICD-10-CM

## 2023-11-30 DIAGNOSIS — Z7902 Long term (current) use of antithrombotics/antiplatelets: Secondary | ICD-10-CM

## 2023-11-30 DIAGNOSIS — Z881 Allergy status to other antibiotic agents status: Secondary | ICD-10-CM

## 2023-11-30 DIAGNOSIS — N3000 Acute cystitis without hematuria: Principal | ICD-10-CM

## 2023-11-30 DIAGNOSIS — I700 Atherosclerosis of aorta: Secondary | ICD-10-CM | POA: Diagnosis present

## 2023-11-30 DIAGNOSIS — Z66 Do not resuscitate: Secondary | ICD-10-CM | POA: Diagnosis present

## 2023-11-30 DIAGNOSIS — A4150 Gram-negative sepsis, unspecified: Principal | ICD-10-CM | POA: Diagnosis present

## 2023-11-30 DIAGNOSIS — Z9181 History of falling: Secondary | ICD-10-CM

## 2023-11-30 DIAGNOSIS — H9190 Unspecified hearing loss, unspecified ear: Secondary | ICD-10-CM | POA: Diagnosis present

## 2023-11-30 DIAGNOSIS — I480 Paroxysmal atrial fibrillation: Secondary | ICD-10-CM | POA: Diagnosis present

## 2023-11-30 DIAGNOSIS — Z888 Allergy status to other drugs, medicaments and biological substances status: Secondary | ICD-10-CM

## 2023-11-30 DIAGNOSIS — K219 Gastro-esophageal reflux disease without esophagitis: Secondary | ICD-10-CM | POA: Diagnosis present

## 2023-11-30 DIAGNOSIS — E785 Hyperlipidemia, unspecified: Secondary | ICD-10-CM | POA: Diagnosis present

## 2023-11-30 DIAGNOSIS — Z79899 Other long term (current) drug therapy: Secondary | ICD-10-CM

## 2023-11-30 DIAGNOSIS — Z8673 Personal history of transient ischemic attack (TIA), and cerebral infarction without residual deficits: Secondary | ICD-10-CM

## 2023-11-30 LAB — CBC WITH DIFFERENTIAL/PLATELET
Abs Immature Granulocytes: 0.18 10*3/uL — ABNORMAL HIGH (ref 0.00–0.07)
Basophils Absolute: 0 10*3/uL (ref 0.0–0.1)
Basophils Relative: 0 %
Eosinophils Absolute: 0.1 10*3/uL (ref 0.0–0.5)
Eosinophils Relative: 0 %
HCT: 40.1 % (ref 39.0–52.0)
Hemoglobin: 13 g/dL (ref 13.0–17.0)
Immature Granulocytes: 2 %
Lymphocytes Relative: 17 %
Lymphs Abs: 2 10*3/uL (ref 0.7–4.0)
MCH: 29.9 pg (ref 26.0–34.0)
MCHC: 32.4 g/dL (ref 30.0–36.0)
MCV: 92.2 fL (ref 80.0–100.0)
Monocytes Absolute: 2 10*3/uL — ABNORMAL HIGH (ref 0.1–1.0)
Monocytes Relative: 17 %
Neutro Abs: 7.4 10*3/uL (ref 1.7–7.7)
Neutrophils Relative %: 64 %
Platelets: 319 10*3/uL (ref 150–400)
RBC: 4.35 MIL/uL (ref 4.22–5.81)
RDW: 12.8 % (ref 11.5–15.5)
WBC: 11.6 10*3/uL — ABNORMAL HIGH (ref 4.0–10.5)
nRBC: 0 % (ref 0.0–0.2)

## 2023-11-30 LAB — COMPREHENSIVE METABOLIC PANEL WITH GFR
ALT: 27 U/L (ref 0–44)
AST: 34 U/L (ref 15–41)
Albumin: 3.5 g/dL (ref 3.5–5.0)
Alkaline Phosphatase: 98 U/L (ref 38–126)
Anion gap: 11 (ref 5–15)
BUN: 35 mg/dL — ABNORMAL HIGH (ref 8–23)
CO2: 22 mmol/L (ref 22–32)
Calcium: 9.4 mg/dL (ref 8.9–10.3)
Chloride: 107 mmol/L (ref 98–111)
Creatinine, Ser: 1.64 mg/dL — ABNORMAL HIGH (ref 0.61–1.24)
GFR, Estimated: 38 mL/min — ABNORMAL LOW (ref >60)
Glucose, Bld: 119 mg/dL — ABNORMAL HIGH (ref 70–99)
Potassium: 3.6 mmol/L (ref 3.5–5.1)
Sodium: 140 mmol/L (ref 135–145)
Total Bilirubin: 0.6 mg/dL (ref 0.0–1.2)
Total Protein: 7.1 g/dL (ref 6.5–8.1)

## 2023-11-30 LAB — LIPASE, BLOOD: Lipase: 34 U/L (ref 11–51)

## 2023-11-30 MED ORDER — FENTANYL CITRATE PF 50 MCG/ML IJ SOSY
50.0000 ug | PREFILLED_SYRINGE | Freq: Once | INTRAMUSCULAR | Status: AC
  Administered 2023-11-30: 50 ug via INTRAVENOUS
  Filled 2023-11-30: qty 1

## 2023-11-30 MED ORDER — IOHEXOL 300 MG/ML  SOLN
80.0000 mL | Freq: Once | INTRAMUSCULAR | Status: AC | PRN
  Administered 2023-11-30: 80 mL via INTRAVENOUS

## 2023-11-30 NOTE — ED Notes (Signed)
 Hight risk fall bundle implemented.

## 2023-11-30 NOTE — ED Provider Notes (Signed)
 [facility] Provider Note    Event Date/Time   First MD Initiated Contact with Patient 11/30/23 2123     (approximate)   History   Abdominal Pain   HPI  Matthew Greer is a 89 y.o. male  who presents to the emergency department today because of concern for lower abdominal and upper leg pain. Symptoms started two days ago. Has been intermittent. Has been severe. Denies similar symptoms in the past. Denies trouble with urination. Has history of stroke which left him with right sided deficits.     Physical Exam   Triage Vital Signs: ED Triage Vitals  Encounter Vitals Group     BP 11/30/23 2118 (!) 145/67     Girls Systolic BP Percentile --      Girls Diastolic BP Percentile --      Boys Systolic BP Percentile --      Boys Diastolic BP Percentile --      Pulse Rate 11/30/23 2118 89     Resp 11/30/23 2118 20     Temp 11/30/23 2118 97.8 F (36.6 C)     Temp Source 11/30/23 2118 Oral     SpO2 11/30/23 2118 100 %     Weight 11/30/23 2129 186 lb 11.7 oz (84.7 kg)     Height 11/30/23 2130 5' 11 (1.803 m)     Head Circumference --      Peak Flow --      Pain Score 11/30/23 2124 10     Pain Loc --      Pain Education --      Exclude from Growth Chart --     Most recent vital signs: Vitals:   11/30/23 2118  BP: (!) 145/67  Pulse: 89  Resp: 20  Temp: 97.8 F (36.6 C)  SpO2: 100%   General: Awake, alert, oriented. CV:  Good peripheral perfusion. Regular rate and rhythm. Resp:  Normal effort. Lungs clear. Abd:  No distention.    ED Results / Procedures / Treatments   Labs (all labs ordered are listed, but only abnormal results are displayed) Labs Reviewed  CBC WITH DIFFERENTIAL/PLATELET - Abnormal; Notable for the following components:      Result Value   WBC 11.6 (*)    Monocytes Absolute 2.0 (*)    Abs Immature Granulocytes 0.18 (*)    All other components within normal limits  COMPREHENSIVE METABOLIC PANEL WITH GFR  LIPASE, BLOOD      EKG  None   RADIOLOGY I independently interpreted and visualized the CT abd/pel. My interpretation: Inflammation in the lower abdomen. Large amount of stool in colon. Radiology interpretation:  IMPRESSION:  1. No acute abnormality in the abdomen/pelvis.  2. Large hiatal hernia with more than 50% of the stomach being  intrathoracic. This is increased in size from 2020 exam.  3. 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 Multiple nonobstructing right  renal calculi.  4. Cholelithiasis without cholecystitis.  5. Colonic diverticulosis without diverticulitis.  6. Enlarged prostate.      PROCEDURES:  Critical Care performed: No    MEDICATIONS ORDERED IN ED: Medications  fentaNYL  (SUBLIMAZE ) injection 50 mcg (50 mcg Intravenous Given 11/30/23 2144)     IMPRESSION / MDM / ASSESSMENT AND PLAN / ED COURSE  I reviewed the triage vital signs and the nursing notes.  Differential diagnosis includes, but is not limited to, UTI, intraabdominal infection, colitis, AAA  Patient's presentation is most consistent with acute presentation with potential threat to life or bodily function.   Patient presented to the emergency department today because of concerns for lower abdominal and upper leg pain that started 2 days ago.  On exam patient is tender in the lower abdomen.  No rash appreciated.  Will check blood work and CT scan.  Will check UA.  Will give patient pain medication.  CT scan without concerning acute findings. Awaiting UA at time of signout.     FINAL CLINICAL IMPRESSION(S) / ED DIAGNOSES   Lower abdominal pain    Note:  This document was prepared using Dragon voice recognition software and may include unintentional dictation errors.    Floy Roberts, MD 12/01/23 [phone]

## 2023-11-30 NOTE — ED Triage Notes (Addendum)
 Pt to ED from home via [facility] for abdominal and upper leg pain. Pt has a history of stroke and hypertension and has a device in one of his legs for blood clots. EMS was not specific about it. Pt is bed bound.  Pt is having lower abdominal pain that radiates down his legs. EMS said there was a strong UTI smell at the house when they picked pt up that is still present.   EMS gave 30mg  Toradol  IV that was very affective for pain control  EMS Vitals: BP154/22 HR: 90s O2: 98% on room air A&Ox4  Does not hear well.   20G LAC

## 2023-12-01 DIAGNOSIS — A4150 Gram-negative sepsis, unspecified: Secondary | ICD-10-CM | POA: Diagnosis present

## 2023-12-01 DIAGNOSIS — Z87891 Personal history of nicotine dependence: Secondary | ICD-10-CM | POA: Diagnosis not present

## 2023-12-01 DIAGNOSIS — Z9181 History of falling: Secondary | ICD-10-CM | POA: Diagnosis not present

## 2023-12-01 DIAGNOSIS — I69351 Hemiplegia and hemiparesis following cerebral infarction affecting right dominant side: Secondary | ICD-10-CM | POA: Diagnosis not present

## 2023-12-01 DIAGNOSIS — Z7982 Long term (current) use of aspirin: Secondary | ICD-10-CM | POA: Diagnosis not present

## 2023-12-01 DIAGNOSIS — N390 Urinary tract infection, site not specified: Secondary | ICD-10-CM | POA: Diagnosis present

## 2023-12-01 DIAGNOSIS — Z66 Do not resuscitate: Secondary | ICD-10-CM | POA: Diagnosis present

## 2023-12-01 DIAGNOSIS — K219 Gastro-esophageal reflux disease without esophagitis: Secondary | ICD-10-CM | POA: Diagnosis present

## 2023-12-01 DIAGNOSIS — Z888 Allergy status to other drugs, medicaments and biological substances status: Secondary | ICD-10-CM | POA: Diagnosis not present

## 2023-12-01 DIAGNOSIS — Z8673 Personal history of transient ischemic attack (TIA), and cerebral infarction without residual deficits: Secondary | ICD-10-CM | POA: Diagnosis not present

## 2023-12-01 DIAGNOSIS — I480 Paroxysmal atrial fibrillation: Secondary | ICD-10-CM | POA: Insufficient documentation

## 2023-12-01 DIAGNOSIS — E785 Hyperlipidemia, unspecified: Secondary | ICD-10-CM | POA: Insufficient documentation

## 2023-12-01 DIAGNOSIS — Z7902 Long term (current) use of antithrombotics/antiplatelets: Secondary | ICD-10-CM | POA: Diagnosis not present

## 2023-12-01 DIAGNOSIS — I700 Atherosclerosis of aorta: Secondary | ICD-10-CM | POA: Diagnosis present

## 2023-12-01 DIAGNOSIS — Z79899 Other long term (current) drug therapy: Secondary | ICD-10-CM | POA: Diagnosis not present

## 2023-12-01 DIAGNOSIS — H9190 Unspecified hearing loss, unspecified ear: Secondary | ICD-10-CM | POA: Diagnosis present

## 2023-12-01 DIAGNOSIS — I10 Essential (primary) hypertension: Secondary | ICD-10-CM | POA: Insufficient documentation

## 2023-12-01 DIAGNOSIS — Z881 Allergy status to other antibiotic agents status: Secondary | ICD-10-CM | POA: Diagnosis not present

## 2023-12-01 LAB — CBC
HCT: 37.3 % — ABNORMAL LOW (ref 39.0–52.0)
Hemoglobin: 12.2 g/dL — ABNORMAL LOW (ref 13.0–17.0)
MCH: 30.3 pg (ref 26.0–34.0)
MCHC: 32.7 g/dL (ref 30.0–36.0)
MCV: 92.8 fL (ref 80.0–100.0)
Platelets: 290 10*3/uL (ref 150–400)
RBC: 4.02 MIL/uL — ABNORMAL LOW (ref 4.22–5.81)
RDW: 12.9 % (ref 11.5–15.5)
WBC: 6.6 10*3/uL (ref 4.0–10.5)
nRBC: 0 % (ref 0.0–0.2)

## 2023-12-01 LAB — BASIC METABOLIC PANEL WITH GFR
Anion gap: 10 (ref 5–15)
BUN: 28 mg/dL — ABNORMAL HIGH (ref 8–23)
CO2: 18 mmol/L — ABNORMAL LOW (ref 22–32)
Calcium: 8.5 mg/dL — ABNORMAL LOW (ref 8.9–10.3)
Chloride: 113 mmol/L — ABNORMAL HIGH (ref 98–111)
Creatinine, Ser: 1.21 mg/dL (ref 0.61–1.24)
GFR, Estimated: 55 mL/min — ABNORMAL LOW (ref >60)
Glucose, Bld: 95 mg/dL (ref 70–99)
Potassium: 3.6 mmol/L (ref 3.5–5.1)
Sodium: 141 mmol/L (ref 135–145)

## 2023-12-01 LAB — URINALYSIS, ROUTINE W REFLEX MICROSCOPIC
Bilirubin Urine: NEGATIVE
Glucose, UA: NEGATIVE mg/dL
Hgb urine dipstick: NEGATIVE
Ketones, ur: NEGATIVE mg/dL
Nitrite: NEGATIVE
Protein, ur: 100 mg/dL — AB
RBC / HPF: >50 RBC/hpf (ref 0–5)
Specific Gravity, Urine: 1.018 (ref 1.005–1.030)
Squamous Epithelial / HPF: 0 /HPF (ref 0–5)
WBC, UA: >50 WBC/hpf (ref 0–5)
pH: 5 (ref 5.0–8.0)

## 2023-12-01 LAB — PROTIME-INR
INR: 1.2 (ref 0.8–1.2)
Prothrombin Time: 16.2 s — ABNORMAL HIGH (ref 11.4–15.2)

## 2023-12-01 LAB — LACTIC ACID, PLASMA
Lactic Acid, Venous: 1.1 mmol/L (ref 0.5–1.9)
Lactic Acid, Venous: 1.2 mmol/L (ref 0.5–1.9)

## 2023-12-01 LAB — PROCALCITONIN: Procalcitonin: 0.12 ng/mL

## 2023-12-01 LAB — CORTISOL-AM, BLOOD: Cortisol - AM: 27.8 ug/dL — ABNORMAL HIGH (ref 6.7–22.6)

## 2023-12-01 MED ORDER — ASPIRIN 81 MG PO TBEC
81.0000 mg | DELAYED_RELEASE_TABLET | Freq: Every day | ORAL | Status: DC
  Administered 2023-12-01 – 2023-12-03 (×3): 81 mg via ORAL
  Filled 2023-12-01 (×3): qty 1

## 2023-12-01 MED ORDER — ONDANSETRON HCL 4 MG PO TABS: 4.0000 mg | ORAL_TABLET | Freq: Four times a day (QID) | ORAL | Status: DC | PRN

## 2023-12-01 MED ORDER — ENOXAPARIN SODIUM 30 MG/0.3ML IJ SOSY
30.0000 mg | PREFILLED_SYRINGE | INTRAMUSCULAR | Status: DC
  Administered 2023-12-02: 30 mg via SUBCUTANEOUS
  Filled 2023-12-01: qty 0.3

## 2023-12-01 MED ORDER — METOPROLOL TARTRATE 5 MG/5ML IV SOLN
5.0000 mg | Freq: Once | INTRAVENOUS | Status: AC
  Administered 2023-12-01: 5 mg via INTRAVENOUS
  Filled 2023-12-01: qty 5

## 2023-12-01 MED ORDER — LACTATED RINGERS IV SOLN: INTRAVENOUS | Status: AC

## 2023-12-01 MED ORDER — ONDANSETRON HCL 4 MG/2ML IJ SOLN: 4.0000 mg | Freq: Four times a day (QID) | INTRAMUSCULAR | Status: DC | PRN

## 2023-12-01 MED ORDER — SODIUM CHLORIDE 0.9 % IV SOLN
2.0000 g | INTRAVENOUS | Status: DC
  Administered 2023-12-02 – 2023-12-03 (×2): 2 g via INTRAVENOUS
  Filled 2023-12-01 (×2): qty 20

## 2023-12-01 MED ORDER — LORATADINE 10 MG PO TABS
10.0000 mg | ORAL_TABLET | Freq: Every day | ORAL | Status: DC
  Administered 2023-12-01 – 2023-12-03 (×3): 10 mg via ORAL
  Filled 2023-12-01 (×3): qty 1

## 2023-12-01 MED ORDER — TRAZODONE HCL 50 MG PO TABS
25.0000 mg | ORAL_TABLET | Freq: Every evening | ORAL | Status: DC | PRN
  Administered 2023-12-01 – 2023-12-02 (×2): 25 mg via ORAL
  Filled 2023-12-01 (×2): qty 1

## 2023-12-01 MED ORDER — METOPROLOL TARTRATE 50 MG PO TABS
50.0000 mg | ORAL_TABLET | Freq: Two times a day (BID) | ORAL | Status: DC
  Administered 2023-12-01 – 2023-12-03 (×4): 50 mg via ORAL
  Filled 2023-12-01 (×4): qty 1

## 2023-12-01 MED ORDER — SODIUM CHLORIDE 0.9 % IV SOLN: 2.0000 g | INTRAVENOUS | Status: DC

## 2023-12-01 MED ORDER — SODIUM CHLORIDE 0.9 % IV BOLUS
1000.0000 mL | Freq: Once | INTRAVENOUS | Status: AC
  Administered 2023-12-01: 1000 mL via INTRAVENOUS

## 2023-12-01 MED ORDER — ACETAMINOPHEN 650 MG RE SUPP: 650.0000 mg | Freq: Four times a day (QID) | RECTAL | Status: DC | PRN

## 2023-12-01 MED ORDER — CLOPIDOGREL BISULFATE 75 MG PO TABS
75.0000 mg | ORAL_TABLET | Freq: Every day | ORAL | Status: DC
  Administered 2023-12-01 – 2023-12-03 (×3): 75 mg via ORAL
  Filled 2023-12-01 (×3): qty 1

## 2023-12-01 MED ORDER — MAGNESIUM HYDROXIDE 400 MG/5ML PO SUSP: 30.0000 mL | Freq: Every day | ORAL | Status: DC | PRN

## 2023-12-01 MED ORDER — ACETAMINOPHEN 325 MG PO TABS
650.0000 mg | ORAL_TABLET | Freq: Four times a day (QID) | ORAL | Status: DC | PRN
Start: 2023-12-01 — End: 2023-12-03
  Administered 2023-12-01 – 2023-12-03 (×5): 650 mg via ORAL
  Filled 2023-12-01 (×5): qty 2

## 2023-12-01 MED ORDER — SODIUM CHLORIDE 0.9 % IV SOLN
1.0000 g | Freq: Once | INTRAVENOUS | Status: AC
  Administered 2023-12-01: 1 g via INTRAVENOUS
  Filled 2023-12-01: qty 10

## 2023-12-01 MED ORDER — ENOXAPARIN SODIUM 40 MG/0.4ML IJ SOSY: 40.0000 mg | PREFILLED_SYRINGE | INTRAMUSCULAR | Status: DC

## 2023-12-01 MED ORDER — PANTOPRAZOLE SODIUM 40 MG PO TBEC
40.0000 mg | DELAYED_RELEASE_TABLET | Freq: Two times a day (BID) | ORAL | Status: DC
  Administered 2023-12-01 – 2023-12-03 (×5): 40 mg via ORAL
  Filled 2023-12-01 (×6): qty 1

## 2023-12-01 MED ORDER — LACTATED RINGERS IV SOLN
150.0000 mL/h | INTRAVENOUS | Status: DC
Start: 2023-12-01 — End: 2023-12-01
  Administered 2023-12-01: 150 mL/h via INTRAVENOUS

## 2023-12-01 MED ORDER — ATORVASTATIN CALCIUM 10 MG PO TABS
10.0000 mg | ORAL_TABLET | Freq: Every evening | ORAL | Status: DC
  Administered 2023-12-01 – 2023-12-02 (×2): 10 mg via ORAL
  Filled 2023-12-01 (×2): qty 1

## 2023-12-01 MED ORDER — AMLODIPINE BESYLATE 5 MG PO TABS
5.0000 mg | ORAL_TABLET | Freq: Every day | ORAL | Status: DC
  Administered 2023-12-01 – 2023-12-03 (×3): 5 mg via ORAL
  Filled 2023-12-01 (×3): qty 1

## 2023-12-01 MED ORDER — METOPROLOL TARTRATE 50 MG PO TABS
50.0000 mg | ORAL_TABLET | Freq: Once | ORAL | Status: AC
  Administered 2023-12-01: 50 mg via ORAL
  Filled 2023-12-01: qty 1

## 2023-12-01 MED ORDER — LOSARTAN POTASSIUM 25 MG PO TABS
25.0000 mg | ORAL_TABLET | Freq: Every day | ORAL | Status: DC
  Administered 2023-12-01 – 2023-12-03 (×3): 25 mg via ORAL
  Filled 2023-12-01 (×3): qty 1

## 2023-12-01 NOTE — Progress Notes (Signed)
 Brief nonbillable note.  Hospitalist update.  Please see same-day H&P for full billable details.  Briefly, this is a 89 year old male history significant for hypertension, CVA, residual right-sided weakness who presents to the ED with acute onset lower abdominal pain associate with urinary frequency and urgency.  Urinalysis concerning for UTI.  Patient also found to be in rapid atrial fibrillation.  Was started on antibiotic therapy.  Atrial fibrillation rate improved control.  Plan: IV antibiotics for UTI IV hydration Follow-up blood and urine cultures  Monitor on telemetry  Calvin Robson MD  No charge

## 2023-12-01 NOTE — Assessment & Plan Note (Signed)
-   The patient will be admitted to a progressive unit bed. - Will continue antibiotic therapy with IV Rocephin . - Will follow blood and urine cultures. - Will continue hydration with IV lactated ringer . - His sepsis is manifested by tachycardia and tachypnea.

## 2023-12-01 NOTE — Plan of Care (Signed)
   Problem: Education: Goal: Knowledge of General Education information will improve Description: Including pain rating scale, medication(s)/side effects and non-pharmacologic comfort measures Outcome: Progressing   Problem: Activity: Goal: Risk for activity intolerance will decrease Outcome: Progressing   Problem: Nutrition: Goal: Adequate nutrition will be maintained Outcome: Progressing

## 2023-12-01 NOTE — Assessment & Plan Note (Signed)
   Continue aspirin and Plavix

## 2023-12-01 NOTE — ED Provider Notes (Incomplete)
 Signout received on this patient pending a CT abdomen

## 2023-12-01 NOTE — Assessment & Plan Note (Signed)
-   We will continue antihypertensive therapy.

## 2023-12-01 NOTE — Assessment & Plan Note (Deleted)
-   The patient's heart rate is much better after hydration and p.o. on IV Lopressor . - Will utilize as needed IV Cardizem . - Will continue his home p.o. Lopressor . - Will continue his aspirin  and Plavix .

## 2023-12-01 NOTE — Progress Notes (Signed)
 PHARMACIST - PHYSICIAN COMMUNICATION  CONCERNING:  Enoxaparin  (Lovenox ) for DVT Prophylaxis    RECOMMENDATION: Patient was prescribed enoxaprin 40mg  q24 hours for VTE prophylaxis.   Filed Weights   11/30/23 2129  Weight: 84.7 kg (186 lb 11.7 oz)    Body mass index is 26.04 kg/m.  Estimated Creatinine Clearance: 28.7 mL/min (A) (by C-G formula based on SCr of 1.64 mg/dL (H)).  Patient is candidate for enoxaparin  30mg  every 24 hours based on CrCl <3ml/min or Weight <45kg  DESCRIPTION: Pharmacy has adjusted enoxaparin  dose per [facility] policy.  Patient is now receiving enoxaparin  30 mg every 24 hours   Rankin CANDIE Dills, PharmD, [facility] 12/01/2023 5:32 AM

## 2023-12-01 NOTE — ED Provider Notes (Addendum)
 Signout received on this patient pending a CT abdomen and urinalysis.  No acute emergency findings noted on CT abdomen.  Still awaiting urinalysis.  I assessed the patient at this time resting comfortably with no complaints.  Palpation of the abdomen shows benign exam.  Bilateral lower extremities feel warm, distal pulses more difficult to palpate on the right lower extremity versus the left, and review of family medicine note from this year notes this is a chronic finding.  His exam is consistent with his prior noted circulation issues, and there is no discoloration or coolness to the right extremity to suggest this is an acute ischemic limb.  He is tachycardic now to the 120s and 30s irregular rate found to be in atrial fibrillation with RVR.  It seems that he is on metoprolol  50 mg twice daily so I will give him a fluid bolus as well as his oral metoprolol  and recheck his rate.  -- Urine shows signs of infection, ongoing tachycardia, so we will get blood cultures lactic and started on IV Rocephin .  Given fluids.  I do think his tachycardia is likely due to his atrial fibrillation in the setting of missing his evening dose of metoprolol  rather than sepsis given no marked leukocytosis, nontoxic-appearing patient, so we will give some pharmacologic rate control with an IV dose of metoprolol .  Admission.    PROCEDURES:  Critical Care performed: Yes, see critical care procedure note(s)  .Critical Care  Performed by: Cyrena Mylar, MD Authorized by: Cyrena Mylar, MD   Critical care provider statement:    Critical care time (minutes):  30   Critical care was time spent personally by me on the following activities:  Development of treatment plan with patient or surrogate, discussions with consultants, evaluation of patient's response to treatment, examination of patient, ordering and review of laboratory studies, ordering and review of radiographic studies, ordering and performing treatments and  interventions, pulse oximetry, re-evaluation of patient's condition and review of old charts       Cyrena Mylar, MD 12/01/23 0107    Cyrena Mylar, MD 12/01/23 [phone]

## 2023-12-01 NOTE — ED Notes (Signed)
 Fall precautions in place for Pt. This RN placed fall band, fall grip socks, bed alarm and fall sign.

## 2023-12-01 NOTE — ED Notes (Signed)
 This NT did an in/out to collect urine with NT [facility]. Pt was informed of all that was going to occur. Pt also has a BM. Pt has on a clean brief, clean chux and clean blankets.

## 2023-12-01 NOTE — Assessment & Plan Note (Signed)
-   The patient's heart rate is much better after hydration and p.o. on IV Lopressor . - Will utilize as needed IV Cardizem . - Will continue his home p.o. Lopressor . - Will continue his aspirin  and Plavix .

## 2023-12-01 NOTE — Assessment & Plan Note (Signed)
 -  We will continue statin therapy.

## 2023-12-01 NOTE — Progress Notes (Signed)
 Patient received from ED. Tele started. Patient situated, alert and oriented. Very HOH.  Matthew Greer V Berdina Cheever

## 2023-12-01 NOTE — Assessment & Plan Note (Signed)
-   We will continue PPI therapy and H2 blocker therapy.

## 2023-12-01 NOTE — H&P (Signed)
 [place]   PATIENT NAME: Matthew Greer    MR#:  [id number]  DATE OF BIRTH:  September 09, 1925  DATE OF ADMISSION:  11/30/2023  PRIMARY CARE PHYSICIAN: [place], [facility]   Patient is coming from: Home  REQUESTING/REFERRING PHYSICIAN: Floy Roberts, MD  CHIEF COMPLAINT:   Chief Complaint  Patient presents with   Abdominal Pain    HISTORY OF PRESENT ILLNESS:  JAMARCUS LADUKE is a 89 y.o. Caucasian male with medical history significant for essential hypertension and CVA with residual right-sided weakness, who presented to the emergency room with acute onset of lower abdominal pain with associated urinary frequency and urgency without dysuria or hematuria or flank pain.  He denied any fever or chills.  His symptoms started a couple days ago and were described as intermittent and severe.  No chest pain or palpitations.  No cough or wheezing or dyspnea.  No headache or dizziness or blurred vision.  No bleeding diathesis.  ED Course: When he came to the ER, BP was 145/67 with otherwise normal vital signs.  Labs revealed a BUN of 35 and creatinine 1.64 close to previous levels and CMP was otherwise unremarkable.  CBC showed leukocytosis of 11.6 and UA was positive for UTI.  Urine and blood cultures were sent. EKG as reviewed by me : Atrial fibrillation with RVR of 125 with Q waves inferiorly. Imaging: Abdominal and pelvic CT with contrast revealed the following: 1. No acute abnormality in the abdomen/pelvis. 2. Large hiatal hernia with more than 50% of the stomach being intrathoracic. This is increased in size from 2020 exam. 3. 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 Multiple nonobstructing right renal calculi. 4. Cholelithiasis without cholecystitis. 5. Colonic diverticulosis without diverticulitis. 6. Enlarged prostate. 7.  Aortic atherosclerosis.  The patient was given 50 mcg of IV fentanyl , 50 mg of p.o. Lopressor  and  5 mg of IV Lopressor , 1 L bolus of IV normal saline twice and 1 g of IV Rocephin .  He will be admitted to a progressive unit bed for further evaluation and management. PAST MEDICAL HISTORY:   Past Medical History:  Diagnosis Date   Hypertension    Stroke [facility]) 2013   minor - no deficits    PAST SURGICAL HISTORY:   Past Surgical History:  Procedure Laterality Date   ESOPHAGEAL DILATION  02/24/2016   Procedure: ESOPHAGEAL DILATION;  Surgeon: Rogelia Copping, MD;  Location: [facility];  Service: Endoscopy;;   ESOPHAGOGASTRODUODENOSCOPY N/A 09/03/2020   Procedure: ESOPHAGOGASTRODUODENOSCOPY (EGD);  Surgeon: Toledo, Ladell MARLA, MD;  Location: [facility] END[facility];  Service: Gastroenterology;  Laterality: N/A;   ESOPHAGOGASTRODUODENOSCOPY (EGD) WITH PROPOFOL  N/A 02/24/2016   Procedure: ESOPHAGOGASTRODUODENOSCOPY (EGD) WITH PROPOFOL ;  Surgeon: Rogelia Copping, MD;  Location: [facility];  Service: Endoscopy;  Laterality: N/A;   ESOPHAGOGASTRODUODENOSCOPY ENDOSCOPY  03/2013   Dr. copping   FLEXIBLE SIGMOIDOSCOPY N/A 09/03/2020   Procedure: FLEXIBLE SIGMOIDOSCOPY;  Surgeon: Toledo, Ladell MARLA, MD;  Location: [facility] ENDOSCOPY;  Service: Gastroenterology;  Laterality: N/A;   IVC FILTER INSERTION N/A 09/04/2020   Procedure: IVC FILTER INSERTION;  Surgeon: Serene Gaile ORN, MD;  Location: [facility] IN[facility];  Service: Cardiovascular;  Laterality: N/A;   THORACOTOMY  1953    SOCIAL HISTORY:   Social History   Tobacco Use   Smoking status: Former   Smokeless tobacco: Never  Substance Use Topics   Alcohol use: No    FAMILY HISTORY:  History reviewed.  No pertinent family history.  DRUG ALLERGIES:   Allergies  Allergen Reactions   Cephalexin  Other (See Comments)    weakness   Chlorhexidine     Finasteride Nausea Only and Other (See Comments)    Reaction:  Confusion    Levofloxacin  Other (See Comments)   Lisinopril Other (See Comments)    Reaction:  Confusion    REVIEW OF SYSTEMS:    ROS As per history of present illness. All pertinent systems were reviewed above. Constitutional, HEENT, cardiovascular, respiratory, GI, GU, musculoskeletal, neuro, psychiatric, endocrine, integumentary and hematologic systems were reviewed and are otherwise negative/unremarkable except for positive findings mentioned above in the HPI.   MEDICATIONS AT HOME:   Prior to Admission medications   Medication Sig Start Date End Date Taking? Authorizing Provider  acetaminophen  (TYLENOL ) 325 MG tablet Take 325-650 mg by mouth every 6 (six) hours as needed for mild pain, moderate pain, fever or headache.    Yes [provider]  amLODipine  (NORVASC ) 5 MG tablet Take 1 tablet (5 mg total) by mouth daily. 04/13/19  Yes Patel, Sona, MD  atorvastatin  (LIPITOR) 10 MG tablet Take 10 mg by mouth every evening.    Yes [provider]  clopidogrel  (PLAVIX ) 75 MG tablet Take 75 mg by mouth daily.   Yes [provider]  losartan  (COZAAR ) 25 MG tablet Take 25 mg by mouth daily. 09/15/19  Yes [provider]  metoprolol  (LOPRESSOR ) 50 MG tablet Take 1 tablet (50 mg total) by mouth 2 (two) times daily. 05/28/16  Yes Mody, [place], MD  aspirin  EC 81 MG EC tablet Take 1 tablet (81 mg total) by mouth daily. 04/13/19   Patel, Sona, MD  famotidine  (PEPCID ) 40 MG tablet Take 40 mg by mouth 2 (two) times daily. Patient not taking: Reported on 12/01/2023 09/15/20   [provider]  HYDROcodone -acetaminophen  (NORCO/VICODIN) 5-325 MG tablet Take 1 tablet by mouth every 6 (six) hours as needed for severe pain. Patient not taking: Reported on 12/01/2023 09/08/20   Arlander Charleston, MD  loratadine  (CLARITIN ) 10 MG tablet Take 10 mg by mouth daily.    [provider]  pantoprazole  (PROTONIX ) 40 MG tablet Take 1 tablet (40 mg total) by mouth 2 (two) times daily. Acid reflux medication to help your esophagus heal. 09/06/20 10/06/20  Awanda [place], MD  STOOL SOFTENER 100 MG capsule  04/22/20    [provider]      VITAL SIGNS:  Blood pressure 105/65, pulse 92, temperature 97.8 F (36.6 C), temperature source Oral, resp. rate (!) 26, height 5' 11 (1.803 m), weight 84.7 kg, SpO2 95%.  PHYSICAL EXAMINATION:  Physical Exam  GENERAL:  89 y.o.-year-old patient lying in the bed with no acute distress.  EYES: Pupils equal, round, reactive to light and accommodation. No scleral icterus. Extraocular muscles intact.  HEENT: Head atraumatic, normocephalic. Oropharynx and nasopharynx clear.  NECK:  Supple, no jugular venous distention. No thyroid enlargement, no tenderness.  LUNGS: Normal breath sounds bilaterally, no wheezing, rales,rhonchi or crepitation. No use of accessory muscles of respiration.  CARDIOVASCULAR: Regular rate and rhythm, S1, S2 normal. No murmurs, rubs, or gallops.  ABDOMEN: Soft, nondistended, with mild suprapubic tenderness without rebound tenderness guarding or rigidity.  Bowel sounds present. No organomegaly or mass.  EXTREMITIES: No pedal edema, cyanosis, or clubbing.  NEUROLOGIC: He has right-sided hemiparesis with intact sensation.  Gait not checked.  PSYCHIATRIC: The patient is alert and oriented x 3.  Normal affect and good eye contact. SKIN: No obvious  rash, lesion, or ulcer.   LABORATORY PANEL:   CBC Recent Labs  Lab 11/30/23 2126  WBC 11.6*  HGB 13.0  HCT 40.1  PLT 319   ------------------------------------------------------------------------------------------------------------------  Chemistries  Recent Labs  Lab 11/30/23 2126  NA 140  K 3.6  CL 107  CO2 22  GLUCOSE 119*  BUN 35*  CREATININE 1.64*  CALCIUM  9.4  AST 34  ALT 27  ALKPHOS 98  BILITOT 0.6   ------------------------------------------------------------------------------------------------------------------  Cardiac Enzymes No results for input(s): TROPONINI in the last 168  hours. ------------------------------------------------------------------------------------------------------------------  RADIOLOGY:  CT ABDOMEN PELVIS W CONTRAST Result Date: 11/30/2023 CLINICAL DATA:  89 year old with lower abdominal pain. EXAM: CT ABDOMEN AND PELVIS WITH CONTRAST TECHNIQUE: Multidetector CT imaging of the abdomen and pelvis was performed using the standard protocol following bolus administration of intravenous contrast. RADIATION DOSE REDUCTION: This exam was performed according to the departmental dose-optimization program which includes automated exposure control, adjustment of the mA and/or kV according to patient size and/or use of iterative reconstruction technique. CONTRAST:  80mL OMNIPAQUE  IOHEXOL  300 MG/ML  SOLN COMPARISON:  04/10/2019 FINDINGS: Lower chest: Atelectasis in the dependent lower lobes, more so on the left adjacent to hiatal hernia. Right pleural calcifications without significant effusion. Hepatobiliary: No focal liver abnormality. Depending calcified gallstones. No pericholecystic inflammation. No biliary dilatation. Pancreas: No ductal dilatation or inflammation. Spleen: Normal in size without focal abnormality. Adrenals/Urinary Tract: No adrenal nodule. Multiple nonobstructing right renal calculi, largest measuring 11 mm. 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 There is no perinephric inflammation. Cortical scarring is noted in the upper left kidney. No ureteral stone. Moderate bladder distension. No definite bladder wall thickening. Small posterior bladder diverticulum. Stomach/Bowel: Large hiatal hernia with more than 50% of the stomach being intrathoracic. This is increased in size from prior exam. There is no small bowel obstruction or inflammatory change. Floppy cecum located in the right mid abdomen. Normal appendix is visualized. Moderate volume of stool in the colon. There is mild colonic redundancy. Left  colonic diverticulosis without focal diverticulitis. Moderate stool distension of the rectum. Vascular/Lymphatic: Calcified and noncalcified atheromatous plaque in the abdominal aorta. No aortic aneurysm. The portal vein is patent. No suspicious lymphadenopathy. Multiple small retroperitoneal lymph nodes are not enlarged by size criteria. IVC filter in place. Reproductive: Enlarged prostate spans 6 cm transverse. Other: No free air or ascites. Small fat containing umbilical and bilateral inguinal hernias. Musculoskeletal: Scoliosis and degenerative change in the spine. IMPRESSION: 1. No acute abnormality in the abdomen/pelvis. 2. Large hiatal hernia with more than 50% of the stomach being intrathoracic. This is increased in size from 2020 exam. 3. 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 Multiple nonobstructing right renal calculi. 4. Cholelithiasis without cholecystitis. 5. Colonic diverticulosis without diverticulitis. 6. Enlarged prostate. Aortic Atherosclerosis (ICD10-I70.0). Electronically Signed   By: Andrea Gasman M.D.   On: 11/30/2023 23:00      IMPRESSION AND PLAN:  Assessment and Plan: * Sepsis due to gram-negative UTI [facility]) - The patient will be admitted to a progressive unit bed. - Will continue antibiotic therapy with IV Rocephin . - Will follow blood and urine cultures. - Will continue hydration with IV lactated ringer . - His sepsis is manifested by tachycardia and tachypnea.   Paroxysmal atrial fibrillation with RVR (HCC) - The patient's heart rate is much better after hydration and p.o. on IV Lopressor . - Will utilize as needed IV Cardizem . - Will continue  his home p.o. Lopressor . - Will continue his aspirin  and Plavix .  Accelerated hypertension - We will continue antihypertensive therapy.  GERD without esophagitis - We will continue PPI therapy and H2 blocker therapy.  Dyslipidemia - We will continue statin  therapy.  History of CVA (cerebrovascular accident) - Continue aspirin  and Plavix .    DVT prophylaxis: Lovenox . Advanced Care Planning:  Code Status: full code. Family Communication:  The plan of care was discussed in details with the patient (and family). I answered all questions. The patient agreed to proceed with the above mentioned plan. Further management will depend upon hospital course. Disposition Plan: Back to previous home environment Consults called: none. All the records are reviewed and case discussed with ED provider.  Status is: Inpatient  At the time of the admission, it appears that the appropriate admission status for this patient is inpatient.  This is judged to be reasonable and necessary in order to provide the required intensity of service to ensure the patient's safety given the presenting symptoms, physical exam findings and initial radiographic and laboratory data in the context of comorbid conditions.  The patient requires inpatient status due to high intensity of service, high risk of further deterioration and high frequency of surveillance required.  I certify that at the time of admission, it is my clinical judgment that the patient will require inpatient hospital care extending more than 2 midnights.                            Dispo: The patient is from: Home              Anticipated d/c is to: Home              Patient currently is not medically stable to d/c.              Difficult to place patient: No  [place]ison DELENA Peaches M.D on 12/01/2023 at 5:54 AM  [facility]   From 7 PM-7 AM, contact night-coverage [email or web address]  CC: Primary care physician; [facility]

## 2023-12-02 DIAGNOSIS — N390 Urinary tract infection, site not specified: Secondary | ICD-10-CM | POA: Diagnosis not present

## 2023-12-02 DIAGNOSIS — A4150 Gram-negative sepsis, unspecified: Secondary | ICD-10-CM | POA: Diagnosis not present

## 2023-12-02 NOTE — Progress Notes (Signed)
 PROGRESS NOTE    Matthew Greer  FMW:[id number] DOB: March 11, 1926 DOA: 11/30/2023 PCP: [facility]    Brief Narrative:  89 year old male history significant for hypertension, CVA, residual right-sided weakness who presents to the ED with acute onset lower abdominal pain associate with urinary frequency and urgency. Urinalysis concerning for UTI. Patient also found to be in rapid atrial fibrillation. Was started on antibiotic therapy. Atrial fibrillation rate improved control.    Assessment & Plan:   Principal Problem:   Sepsis due to gram-negative UTI (HCC) Active Problems:   UTI (urinary tract infection)   History of CVA (cerebrovascular accident)   Dyslipidemia   GERD without esophagitis   Accelerated hypertension   Paroxysmal atrial fibrillation with RVR (HCC)  Sepsis due to gram-negative UTI (HCC) Sepsis physiology improving.  Urine culture positive for greater than 100,000 colony-forming units of gram-negative rods.  Speciation pending Plan:  Continue Rocephin  Follow cultures Continue fluids Monitor vitals and fever curve  Paroxysmal atrial fibrillation with RVR (HCC) Heart rate improved Continue Lopressor  Continue DAPT Not on anticoagulation for unclear reasons   Accelerated hypertension PTA Cozaar  Norvasc    GERD without esophagitis PPI   Dyslipidemia Statin   History of CVA (cerebrovascular accident) PT aspirin  and Plavix        DVT prophylaxis: Lovenox  Code Status: DNR Family Communication:Legal guardian Ormond at bedside 6/29 Disposition Plan: Status is: Inpatient Remains inpatient appropriate because: UTI on IV antibiotics right   Level of care: Telemetry Medical  Consultants:  None  Procedures:  None  Antimicrobials: Ceftriaxone    Subjective: Seen and examined.  Lying in bed.  No distress.  Objective: Vitals:   12/01/23 1535 12/01/23 2021 12/02/23 0430 12/02/23 0756  BP: 137/65 (!) 149/73 134/72 (!) 144/66  Pulse: 75  77 69 73  Resp: 20 17 18 20   Temp: 97.8 F (36.6 C) 97.8 F (36.6 C) 98.3 F (36.8 C) 97.9 F (36.6 C)  TempSrc:      SpO2: 98% 96% 93% 94%  Weight:      Height:        Intake/Output Summary (Last 24 hours) at 12/02/2023 1355 Last data filed at 12/02/2023 0618 Gross per 24 hour  Intake 1735.16 ml  Output 300 ml  Net 1435.16 ml   Filed Weights   11/30/23 2129  Weight: 84.7 kg    Examination:  General exam: Appears calm and comfortable  Respiratory system: Clear to auscultation. Respiratory effort normal. Cardiovascular system: S1-S2, RRR, no murmurs, no pedal edema Gastrointestinal system: Soft, T/ND, normal bowel sounds Central nervous system: Alert.  Oriented.  Unable to fully assess.  No focal deficits.  Hearing loss. Extremities: Symmetric 5 x 5 power. Skin: No rashes, lesions or ulcers Psychiatry: Judgement and insight appear impaired. Mood & affect flattened.     Data Reviewed: I have personally reviewed following labs and imaging studies  CBC: Recent Labs  Lab 11/30/23 2126 12/01/23 0737  WBC 11.6* 6.6  NEUTROABS 7.4  --   HGB 13.0 12.2*  HCT 40.1 37.3*  MCV 92.2 92.8  PLT 319 290   Basic Metabolic Panel: Recent Labs  Lab 11/30/23 2126 12/01/23 0737  NA 140 141  K 3.6 3.6  CL 107 113*  CO2 22 18*  GLUCOSE 119* 95  BUN 35* 28*  CREATININE 1.64* 1.21  CALCIUM  9.4 8.5*   GFR: Estimated Creatinine Clearance: 38.9 mL/min (by C-G formula based on SCr of 1.21 mg/dL). Liver Function Tests: Recent Labs  Lab 11/30/23 2126  AST  34  ALT 27  ALKPHOS 98  BILITOT 0.6  PROT 7.1  ALBUMIN 3.5   Recent Labs  Lab 11/30/23 2126  LIPASE 34   No results for input(s): AMMONIA in the last 168 hours. Coagulation Profile: Recent Labs  Lab 12/01/23 0737  INR 1.2   Cardiac Enzymes: No results for input(s): CKTOTAL, CKMB, CKMBINDEX, TROPONINI in the last 168 hours. BNP (last 3 results) No results for input(s): PROBNP in the last 8760  hours. HbA1C: No results for input(s): HGBA1C in the last 72 hours. CBG: No results for input(s): GLUCAP in the last 168 hours. Lipid Profile: No results for input(s): CHOL, HDL, LDLCALC, TRIG, CHOLHDL, LDLDIRECT in the last 72 hours. Thyroid Function Tests: No results for input(s): TSH, T4TOTAL, FREET4, T3FREE, THYROIDAB in the last 72 hours. Anemia Panel: No results for input(s): VITAMINB12, FOLATE, FERRITIN, TIBC, IRON, RETICCTPCT in the last 72 hours. Sepsis Labs: Recent Labs  Lab 12/01/23 0332 12/01/23 0737 12/01/23 0740  PROCALCITON  --   --  0.12  LATICACIDVEN 1.1 1.2  --     Recent Results (from the past 240 hours)  Urine Culture (for pregnant, neutropenic or urologic patients or patients with an indwelling urinary catheter)     Status: Abnormal (Preliminary result)   Collection Time: 12/01/23 12:01 AM   Specimen: Urine, Random  Result Value Ref Range Status   Specimen Description   Final    URINE, RANDOM Performed at [facility], [address] [place], [place] [address]    Special Requests   Final    NONE Performed at [facility], [address] [place], [place] [address]    Culture (A)  Final    >=100,000 COLONIES/mL GRAM NEGATIVE RODS CULTURE REINCUBATED FOR BETTER GROWTH Performed at [facility], [address], [place], [place] [address]    Report Status PENDING  Incomplete  Blood Culture (routine x 2)     Status: None (Preliminary result)   Collection Time: 12/01/23  3:32 AM   Specimen: BLOOD  Result Value Ref Range Status   Specimen Description BLOOD BLOOD LEFT FOREARM  Final   Special Requests   Final    BOTTLES DRAWN AEROBIC AND ANAEROBIC Blood Culture results may not be optimal due to an inadequate volume of blood received in culture bottles   Culture   Final    NO GROWTH 1 DAY Performed at [facility], [address], [place], [place] [address]    Report Status  PENDING  Incomplete  Blood Culture (routine x 2)     Status: None (Preliminary result)   Collection Time: 12/01/23  3:32 AM   Specimen: BLOOD  Result Value Ref Range Status   Specimen Description BLOOD LEFT ANTECUBITAL  Final   Special Requests   Final    BOTTLES DRAWN AEROBIC AND ANAEROBIC Blood Culture results may not be optimal due to an inadequate volume of blood received in culture bottles   Culture   Final    NO GROWTH 1 DAY Performed at [facility], [address] [place], [place] [address]    Report Status PENDING  Incomplete         Radiology Studies: CT ABDOMEN PELVIS W CONTRAST Result Date: 11/30/2023 CLINICAL DATA:  89 year old with lower abdominal pain. EXAM: CT ABDOMEN AND PELVIS WITH CONTRAST TECHNIQUE: Multidetector CT imaging of the abdomen and pelvis was performed using the standard protocol following bolus administration of intravenous contrast. RADIATION DOSE REDUCTION: This exam was  performed according to the departmental dose-optimization program which includes automated exposure control, adjustment of the mA and/or kV according to patient size and/or use of iterative reconstruction technique. CONTRAST:  80mL OMNIPAQUE  IOHEXOL  300 MG/ML  SOLN COMPARISON:  04/10/2019 FINDINGS: Lower chest: Atelectasis in the dependent lower lobes, more so on the left adjacent to hiatal hernia. Right pleural calcifications without significant effusion. Hepatobiliary: No focal liver abnormality. Depending calcified gallstones. No pericholecystic inflammation. No biliary dilatation. Pancreas: No ductal dilatation or inflammation. Spleen: Normal in size without focal abnormality. Adrenals/Urinary Tract: No adrenal nodule. Multiple nonobstructing right renal calculi, largest measuring 11 mm. 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 There is no perinephric inflammation. Cortical scarring is noted in the upper left kidney. No ureteral  stone. Moderate bladder distension. No definite bladder wall thickening. Small posterior bladder diverticulum. Stomach/Bowel: Large hiatal hernia with more than 50% of the stomach being intrathoracic. This is increased in size from prior exam. There is no small bowel obstruction or inflammatory change. Floppy cecum located in the right mid abdomen. Normal appendix is visualized. Moderate volume of stool in the colon. There is mild colonic redundancy. Left colonic diverticulosis without focal diverticulitis. Moderate stool distension of the rectum. Vascular/Lymphatic: Calcified and noncalcified atheromatous plaque in the abdominal aorta. No aortic aneurysm. The portal vein is patent. No suspicious lymphadenopathy. Multiple small retroperitoneal lymph nodes are not enlarged by size criteria. IVC filter in place. Reproductive: Enlarged prostate spans 6 cm transverse. Other: No free air or ascites. Small fat containing umbilical and bilateral inguinal hernias. Musculoskeletal: Scoliosis and degenerative change in the spine. IMPRESSION: 1. No acute abnormality in the abdomen/pelvis. 2. Large hiatal hernia with more than 50% of the stomach being intrathoracic. This is increased in size from 2020 exam. 3. 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 Multiple nonobstructing right renal calculi. 4. Cholelithiasis without cholecystitis. 5. Colonic diverticulosis without diverticulitis. 6. Enlarged prostate. Aortic Atherosclerosis (ICD10-I70.0). Electronically Signed   By: Andrea Gasman M.D.   On: 11/30/2023 23:00        Scheduled Meds:  amLODipine   5 mg Oral Daily   aspirin  EC  81 mg Oral Daily   atorvastatin   10 mg Oral QPM   clopidogrel   75 mg Oral Daily   enoxaparin  (LOVENOX ) injection  30 mg Subcutaneous Q24H   loratadine   10 mg Oral Daily   losartan   25 mg Oral Daily   metoprolol  tartrate  50 mg Oral BID   pantoprazole   40 mg Oral BID   Continuous  Infusions:  cefTRIAXone  (ROCEPHIN )  IV 2 g (12/02/23 0952)     LOS: 1 day      Matthew KATHEE Robson, MD [facility]   If 7PM-7AM, please contact night-coverage  12/02/2023, 1:55 PM

## 2023-12-02 NOTE — Plan of Care (Signed)

## 2023-12-03 DIAGNOSIS — A4150 Gram-negative sepsis, unspecified: Secondary | ICD-10-CM | POA: Diagnosis not present

## 2023-12-03 DIAGNOSIS — N390 Urinary tract infection, site not specified: Secondary | ICD-10-CM | POA: Diagnosis not present

## 2023-12-03 LAB — BASIC METABOLIC PANEL WITH GFR
Anion gap: 8 (ref 5–15)
BUN: 21 mg/dL (ref 8–23)
CO2: 22 mmol/L (ref 22–32)
Calcium: 8.8 mg/dL — ABNORMAL LOW (ref 8.9–10.3)
Chloride: 108 mmol/L (ref 98–111)
Creatinine, Ser: 1.32 mg/dL — ABNORMAL HIGH (ref 0.61–1.24)
GFR, Estimated: 50 mL/min — ABNORMAL LOW (ref >60)
Glucose, Bld: 92 mg/dL (ref 70–99)
Potassium: 3.5 mmol/L (ref 3.5–5.1)
Sodium: 138 mmol/L (ref 135–145)

## 2023-12-03 LAB — CBC WITH DIFFERENTIAL/PLATELET
Abs Immature Granulocytes: 0.03 10*3/uL (ref 0.00–0.07)
Basophils Absolute: 0 10*3/uL (ref 0.0–0.1)
Basophils Relative: 0 %
Eosinophils Absolute: 0.1 10*3/uL (ref 0.0–0.5)
Eosinophils Relative: 3 %
HCT: 33 % — ABNORMAL LOW (ref 39.0–52.0)
Hemoglobin: 11.1 g/dL — ABNORMAL LOW (ref 13.0–17.0)
Immature Granulocytes: 1 %
Lymphocytes Relative: 18 %
Lymphs Abs: 0.8 10*3/uL (ref 0.7–4.0)
MCH: 30.6 pg (ref 26.0–34.0)
MCHC: 33.6 g/dL (ref 30.0–36.0)
MCV: 90.9 fL (ref 80.0–100.0)
Monocytes Absolute: 0.7 10*3/uL (ref 0.1–1.0)
Monocytes Relative: 16 %
Neutro Abs: 2.7 10*3/uL (ref 1.7–7.7)
Neutrophils Relative %: 62 %
Platelets: 263 10*3/uL (ref 150–400)
RBC: 3.63 MIL/uL — ABNORMAL LOW (ref 4.22–5.81)
RDW: 13 % (ref 11.5–15.5)
WBC: 4.3 10*3/uL (ref 4.0–10.5)
nRBC: 0 % (ref 0.0–0.2)

## 2023-12-03 MED ORDER — CEFDINIR 300 MG PO CAPS: 300.0000 mg | ORAL_CAPSULE | Freq: Two times a day (BID) | ORAL | 0 refills | Status: AC

## 2023-12-03 MED ORDER — ENOXAPARIN SODIUM 40 MG/0.4ML IJ SOSY
40.0000 mg | PREFILLED_SYRINGE | INTRAMUSCULAR | Status: DC
  Administered 2023-12-03: 40 mg via SUBCUTANEOUS
  Filled 2023-12-03: qty 0.4

## 2023-12-03 NOTE — TOC Progression Note (Addendum)
 Transition of Care [facility]) - Progression Note    Patient Details  Name: Matthew Greer MRN: [id number] Date of Birth: 11/08/1925  Transition of Care [facility] CM/SW Contact  Matthew LILLETTE Fenton, LCSW Phone Number: 12/03/2023, 12:54 PM  Clinical Narrative:    CSW added to secure chat, pt will be offered HHPT as recommended by PT.  PT also recommended a [employer]p- equipment to assist.  CSW agreed to check DME provider.  [employer] does not carry device.   CSW visited pt , he was sitting in his chair, spoke to him about HHPT referral, pt declined. Pt partner Oromond called and I spoke with him while at RN station- he stated pt has someone who assists with PT at home and they do not need a referral.  He was calling to support transportation home . TOC to continue to follow.  Addendum: CSW arranged [facility] ambulance transportation and scheduled for 2:30-3:30.  NO other TOC needs.    Barriers to Discharge: No Barriers Identified  Expected Discharge Plan and Services         Expected Discharge Date: 12/03/23                                     Social Determinants of Health (SDOH) Interventions SDOH Screenings   Food Insecurity: No Food Insecurity (12/03/2023)  Housing: Unknown (12/03/2023)  Transportation Needs: No Transportation Needs (12/03/2023)  Utilities: Not At Risk (12/03/2023)  Financial Resource Strain: Low Risk  (05/09/2022)   Received from [facility]  Physical Activity: Insufficiently Active (09/21/2020)   Received from [facility]  Social Connections: Unknown (12/03/2023)  Stress: No Stress Concern Present (09/21/2020)   Received from [facility]  Tobacco Use: Medium Risk (11/30/2023)    Readmission Risk Interventions     No data to display

## 2023-12-03 NOTE — Plan of Care (Signed)
  Problem: Fluid Volume: Goal: Hemodynamic stability will improve Outcome: Adequate for Discharge   Problem: Clinical Measurements: Goal: Diagnostic test results will improve Outcome: Adequate for Discharge Goal: Signs and symptoms of infection will decrease Outcome: Adequate for Discharge   Problem: Respiratory: Goal: Ability to maintain adequate ventilation will improve Outcome: Adequate for Discharge   Problem: Education: Goal: Knowledge of General Education information will improve Description: Including pain rating scale, medication(s)/side effects and non-pharmacologic comfort measures Outcome: Adequate for Discharge   Problem: Health Behavior/Discharge Planning: Goal: Ability to manage health-related needs will improve Outcome: Adequate for Discharge   Problem: Clinical Measurements: Goal: Ability to maintain clinical measurements within normal limits will improve Outcome: Adequate for Discharge Goal: Will remain free from infection Outcome: Adequate for Discharge Goal: Diagnostic test results will improve Outcome: Adequate for Discharge Goal: Respiratory complications will improve Outcome: Adequate for Discharge Goal: Cardiovascular complication will be avoided Outcome: Adequate for Discharge   Problem: Activity: Goal: Risk for activity intolerance will decrease Outcome: Progressing   Problem: Nutrition: Goal: Adequate nutrition will be maintained Outcome: Adequate for Discharge   Problem: Coping: Goal: Level of anxiety will decrease Outcome: Adequate for Discharge   Problem: Elimination: Goal: Will not experience complications related to bowel motility Outcome: Adequate for Discharge Goal: Will not experience complications related to urinary retention Outcome: Adequate for Discharge    Pt d/c to home. IV removed AVS reviewed, follow up questions answered. No other needs voiced at this time. BP (!) 149/78 (BP Location: Left Arm)   Pulse 66   Temp 97.8  F (36.6 C)   Resp 17   Ht 5' 11 (1.803 m)   Wt 84.7 kg   SpO2 96%   BMI 26.04 kg/m  Aureliano VEAR Louder 12/03/23 3:29 PM

## 2023-12-03 NOTE — Evaluation (Signed)
 Occupational Therapy Evaluation Patient Details Name: Matthew Greer MRN: [id number] DOB: 05-13-1926 Today's Date: 12/03/2023   History of Present Illness   Pt is a 89 y.o. male with medical history significant for essential hypertension and CVA with residual right-sided weakness, who presented to the emergency room with acute onset of lower abdominal pain with associated urinary frequency and urgency without dysuria or hematuria or flank pain.  MD assessment includes: sepsis due to gram-negative UTI, paroxysmal atrial fibrillation with RVR, and accelerated hypertension.    Clinical Impressions Matthew Greer was seen for OT evaluation this date. Prior to hospital admission, pt required assistance for transfers and ADLs. Pt lives with partner and has caregiver aids per chart review. Pt currently requires MOD A exit bed, fair sitting balance, MOD A don socks in sitting. MOD A for bed>chair squat pivot t/f. Noted incontinent of stool, +2 assist for MAX pericare standing. Pt would benefit from skilled OT to address noted impairments and functional limitations (see below for any additional details). Upon hospital discharge, recommend OT follow up.     If plan is discharge home, recommend the following:   Help with stairs or ramp for entrance;A lot of help with bathing/dressing/bathroom;A lot of help with walking and/or transfers     Functional Status Assessment   Patient has had a recent decline in their functional status and demonstrates the ability to make significant improvements in function in a reasonable and predictable amount of time.     Equipment Recommendations   None recommended by OT     Recommendations for Other Services         Precautions/Restrictions   Precautions Precautions: Fall Recall of Precautions/Restrictions: Intact Restrictions Weight Bearing Restrictions Per Provider Order: No     Mobility Bed Mobility Overal bed mobility: Needs Assistance Bed  Mobility: Supine to Sit     Supine to sit: Mod assist          Transfers Overall transfer level: Needs assistance Equipment used: 1 person hand held assist Transfers: Sit to/from Stand, Bed to chair/wheelchair/BSC Sit to Stand: Mod assist   Squat pivot transfers: Mod assist              Balance Overall balance assessment: Needs assistance Sitting-balance support: No upper extremity supported, Feet supported Sitting balance-Leahy Scale: Fair     Standing balance support: Single extremity supported, During functional activity Standing balance-Leahy Scale: Poor                             ADL either performed or assessed with clinical judgement   ADL Overall ADL's : Needs assistance/impaired                                       General ADL Comments: MOD A for simulated BSC t/f. MOD A don socks in sitting     Vision         Perception         Praxis         Pertinent Vitals/Pain Pain Assessment Pain Assessment: Faces Faces Pain Scale: Hurts even more Pain Location: R hip with AROM Pain Descriptors / Indicators: Grimacing, Guarding Pain Intervention(s): Limited activity within patient's tolerance, Repositioned, Patient requesting pain meds-RN notified     Extremity/Trunk Assessment Upper Extremity Assessment Upper Extremity Assessment: RUE deficits/detail RUE Deficits / Details: Chronic Rigidity/Spasticity from  prior CVA   Lower Extremity Assessment Lower Extremity Assessment: Generalized weakness RLE Deficits / Details: Chronic weakness from prior CVA       Communication Communication Communication: Impaired Factors Affecting Communication: Hearing impaired   Cognition Arousal: Alert Behavior During Therapy: WFL for tasks assessed/performed Cognition: No apparent impairments                               Following commands: Intact       Cueing  General Comments   Cueing Techniques: Gestural  cues;Verbal cues;Tactile cues;Visual cues      Exercises     Shoulder Instructions      Home Living Family/patient expects to be discharged to:: Private residence Living Arrangements: Spouse/significant other Available Help at Discharge: Family;Available 24 hours/day Type of Home: House Home Access: Ramped entrance     Home Layout: One level     Bathroom Shower/Tub: Producer, television/film/video: Handicapped height     Home Equipment: Agricultural consultant (2 wheels);Cane - single point;Shower seat   Additional Comments: Pt VERY hard of hearing.  History obtained from the pt as able but supplemented with chart review.  Unable to reach caregiver by phone.      Prior Functioning/Environment Prior Level of Function : Needs assist             Mobility Comments: Pt reported being non-ambulatory and needing assistance with stand/squat pivot transfers ADLs Comments: Assist needed for all ADLs    OT Problem List: Decreased strength;Decreased range of motion;Decreased activity tolerance;Impaired balance (sitting and/or standing)   OT Treatment/Interventions: Self-care/ADL training;Therapeutic exercise;Energy conservation;DME and/or AE instruction;Therapeutic activities      OT Goals(Current goals can be found in the care plan section)   Acute Rehab OT Goals Patient Stated Goal: go home OT Goal Formulation: With patient Time For Goal Achievement: 12/17/23 Potential to Achieve Goals: Good ADL Goals Pt Will Perform Grooming: with min assist;sitting Pt Will Perform Lower Body Dressing: with mod assist;sit to/from stand Pt Will Transfer to Toilet: with min assist;stand pivot transfer;bedside commode   OT Frequency:  Min 2X/week    Co-evaluation              AM-PAC OT 6 Clicks Daily Activity     Outcome Measure Help from another person eating meals?: None Help from another person taking care of personal grooming?: A Little Help from another person toileting,  which includes using toliet, bedpan, or urinal?: A Lot Help from another person bathing (including washing, rinsing, drying)?: A Lot Help from another person to put on and taking off regular upper body clothing?: A Little Help from another person to put on and taking off regular lower body clothing?: A Lot 6 Click Score: 16   End of Session Nurse Communication: Mobility status;Patient requests pain meds  Activity Tolerance: Patient tolerated treatment well Patient left: in chair;with call bell/phone within reach;with chair alarm set;with nursing/sitter in room  OT Visit Diagnosis: Other abnormalities of gait and mobility (R26.89);Muscle weakness (generalized) (M62.81)                Time: 9050-8986 OT Time Calculation (min): 24 min Charges:  OT General Charges $OT Visit: 1 Visit OT Evaluation $OT Eval Moderate Complexity: 1 Mod OT Treatments $Self Care/Home Management : 8-22 mins Elston Slot, M.S. OTR/L  12/03/23, 3:05 PM  ascom [phone]

## 2023-12-03 NOTE — Plan of Care (Signed)

## 2023-12-03 NOTE — Discharge Summary (Signed)
 Physician Discharge Summary  Curren Mohrmann [facility] FMW:[id number] DOB: 12/03/1925 DOA: 11/30/2023  PCP: [facility]  Admit date: 11/30/2023 Discharge date: 12/03/2023  Admitted From: Home  Disposition:  Home with home health  Recommendations for Outpatient Follow-up:  Follow up with PCP in 1-2 weeks   Home Health:Yes PT OT  Equipment/Devices:None   Discharge Condition:Stable  CODE STATUS:DNR  Diet recommendation: Reg  Brief/Interim Summary:  89 year old male history significant for hypertension, CVA, residual right-sided weakness who presents to the ED with acute onset lower abdominal pain associate with urinary frequency and urgency. Urinalysis concerning for UTI. Patient also found to be in rapid atrial fibrillation. Was started on antibiotic therapy. Atrial fibrillation rate improved control.   Discharge Diagnoses:  Principal Problem:   Sepsis due to gram-negative UTI (HCC) Active Problems:   UTI (urinary tract infection)   History of CVA (cerebrovascular accident)   Dyslipidemia   GERD without esophagitis   Accelerated hypertension   Paroxysmal atrial fibrillation with RVR (HCC)   Sepsis due to gram-negative UTI (HCC) Sepsis physiology improving.  Urine culture positive for greater than 100,000 colony-forming units of gram-negative rods.  Speciation pending at time of discharge.  Patient has responded to third-generation cephalosporins.  Will de-escalate to cefdinir  to complete 7-day course discharge home.   Paroxysmal atrial fibrillation with RVR (HCC) Heart rate improved Continue Lopressor  Continue DAPT Not on anticoagulation.  Presumably due to fall risk   Accelerated hypertension PTA Cozaar  Norvasc    GERD without esophagitis PPI   Dyslipidemia Statin   History of CVA (cerebrovascular accident) PT aspirin  and Plavix       Discharge Instructions  Discharge Instructions     Diet - low sodium heart healthy   Complete by: As directed    Increase  activity slowly   Complete by: As directed       Allergies as of 12/03/2023       Reactions   Cephalexin  Other (See Comments)   weakness   Chlorhexidine     Finasteride Nausea Only, Other (See Comments)   Reaction:  Confusion    Levofloxacin  Other (See Comments)   Lisinopril Other (See Comments)   Reaction:  Confusion        Medication List     TAKE these medications    acetaminophen  325 MG tablet Commonly known as: TYLENOL  Take 325-650 mg by mouth every 6 (six) hours as needed for mild pain, moderate pain, fever or headache.   amLODipine  5 MG tablet Commonly known as: NORVASC  Take 1 tablet (5 mg total) by mouth daily.   aspirin  EC 81 MG tablet Take 1 tablet (81 mg total) by mouth daily.   atorvastatin  10 MG tablet Commonly known as: LIPITOR Take 10 mg by mouth every evening.   cefdinir  300 MG capsule Commonly known as: OMNICEF  Take 1 capsule (300 mg total) by mouth 2 (two) times daily for 5 days.   clopidogrel  75 MG tablet Commonly known as: PLAVIX  Take 75 mg by mouth daily.   loratadine  10 MG tablet Commonly known as: CLARITIN  Take 10 mg by mouth daily.   losartan  25 MG tablet Commonly known as: COZAAR  Take 25 mg by mouth daily.   metoprolol  tartrate 50 MG tablet Commonly known as: LOPRESSOR  Take 1 tablet (50 mg total) by mouth 2 (two) times daily.   pantoprazole  40 MG tablet Commonly known as: Protonix  Take 1 tablet (40 mg total) by mouth 2 (two) times daily. Acid reflux medication to help your esophagus heal.  Allergies  Allergen Reactions   Cephalexin  Other (See Comments)    weakness   Chlorhexidine     Finasteride Nausea Only and Other (See Comments)    Reaction:  Confusion    Levofloxacin  Other (See Comments)   Lisinopril Other (See Comments)    Reaction:  Confusion    Consultations: None   Procedures/Studies: CT ABDOMEN PELVIS W CONTRAST Result Date: 11/30/2023 CLINICAL DATA:  89 year old with lower abdominal pain. EXAM:  CT ABDOMEN AND PELVIS WITH CONTRAST TECHNIQUE: Multidetector CT imaging of the abdomen and pelvis was performed using the standard protocol following bolus administration of intravenous contrast. RADIATION DOSE REDUCTION: This exam was performed according to the departmental dose-optimization program which includes automated exposure control, adjustment of the mA and/or kV according to patient size and/or use of iterative reconstruction technique. CONTRAST:  80mL OMNIPAQUE  IOHEXOL  300 MG/ML  SOLN COMPARISON:  04/10/2019 FINDINGS: Lower chest: Atelectasis in the dependent lower lobes, more so on the left adjacent to hiatal hernia. Right pleural calcifications without significant effusion. Hepatobiliary: No focal liver abnormality. Depending calcified gallstones. No pericholecystic inflammation. No biliary dilatation. Pancreas: No ductal dilatation or inflammation. Spleen: Normal in size without focal abnormality. Adrenals/Urinary Tract: No adrenal nodule. Multiple nonobstructing right renal calculi, largest measuring 11 mm. 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 There is no perinephric inflammation. Cortical scarring is noted in the upper left kidney. No ureteral stone. Moderate bladder distension. No definite bladder wall thickening. Small posterior bladder diverticulum. Stomach/Bowel: Large hiatal hernia with more than 50% of the stomach being intrathoracic. This is increased in size from prior exam. There is no small bowel obstruction or inflammatory change. Floppy cecum located in the right mid abdomen. Normal appendix is visualized. Moderate volume of stool in the colon. There is mild colonic redundancy. Left colonic diverticulosis without focal diverticulitis. Moderate stool distension of the rectum. Vascular/Lymphatic: Calcified and noncalcified atheromatous plaque in the abdominal aorta. No aortic aneurysm. The portal vein is patent. No suspicious  lymphadenopathy. Multiple small retroperitoneal lymph nodes are not enlarged by size criteria. IVC filter in place. Reproductive: Enlarged prostate spans 6 cm transverse. Other: No free air or ascites. Small fat containing umbilical and bilateral inguinal hernias. Musculoskeletal: Scoliosis and degenerative change in the spine. IMPRESSION: 1. No acute abnormality in the abdomen/pelvis. 2. Large hiatal hernia with more than 50% of the stomach being intrathoracic. This is increased in size from 2020 exam. 3. 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 Multiple nonobstructing right renal calculi. 4. Cholelithiasis without cholecystitis. 5. Colonic diverticulosis without diverticulitis. 6. Enlarged prostate. Aortic Atherosclerosis (ICD10-I70.0). Electronically Signed   By: Andrea Gasman M.D.   On: 11/30/2023 23:00      Subjective: Seen and examined on the day of discharge.  Stable no distress.  Appropriate for discharge home.  Discharge Exam: Vitals:   12/03/23 0353 12/03/23 0800  BP: (!) 145/68 (!) 149/78  Pulse: 61 66  Resp: 18 17  Temp: 98 F (36.7 C) 97.8 F (36.6 C)  SpO2: 94% 96%   Vitals:   12/02/23 1549 12/02/23 2020 12/03/23 0353 12/03/23 0800  BP: (!) 144/74 (!) 144/70 (!) 145/68 (!) 149/78  Pulse: 61 71 61 66  Resp: 18 16 18 17   Temp: 97.9 F (36.6 C) 98.1 F (36.7 C) 98 F (36.7 C) 97.8 F (36.6 C)  TempSrc:  Oral    SpO2: 94% 98% 94% 96%  Weight:  Height:        General: Pt is alert, awake, not in acute distress Cardiovascular: RRR, S1/S2 +, no rubs, no gallops Respiratory: CTA bilaterally, no wheezing, no rhonchi Abdominal: Soft, NT, ND, bowel sounds + Extremities: no edema, no cyanosis    The results of significant diagnostics from this hospitalization (including imaging, microbiology, ancillary and laboratory) are listed below for reference.     Microbiology: Recent Results (from the past 240 hours)   Urine Culture (for pregnant, neutropenic or urologic patients or patients with an indwelling urinary catheter)     Status: Abnormal (Preliminary result)   Collection Time: 12/01/23 12:01 AM   Specimen: Urine, Random  Result Value Ref Range Status   Specimen Description   Final    URINE, RANDOM Performed at [facility], [address], [place], [place] [address]    Special Requests   Final    NONE Performed at [facility], [address] [place], [place] [address]    Culture (A)  Final    >=100,000 COLONIES/mL GRAM NEGATIVE RODS CULTURE REINCUBATED FOR BETTER GROWTH Performed at [facility], [address] [place], [place] [address]    Report Status PENDING  Incomplete  Blood Culture (routine x 2)     Status: None (Preliminary result)   Collection Time: 12/01/23  3:32 AM   Specimen: BLOOD  Result Value Ref Range Status   Specimen Description BLOOD BLOOD LEFT FOREARM  Final   Special Requests   Final    BOTTLES DRAWN AEROBIC AND ANAEROBIC Blood Culture results may not be optimal due to an inadequate volume of blood received in culture bottles   Culture   Final    NO GROWTH 2 DAYS Performed at [facility], [address], [place], [place] [address]    Report Status PENDING  Incomplete  Blood Culture (routine x 2)     Status: None (Preliminary result)   Collection Time: 12/01/23  3:32 AM   Specimen: BLOOD  Result Value Ref Range Status   Specimen Description BLOOD LEFT ANTECUBITAL  Final   Special Requests   Final    BOTTLES DRAWN AEROBIC AND ANAEROBIC Blood Culture results may not be optimal due to an inadequate volume of blood received in culture bottles   Culture   Final    NO GROWTH 2 DAYS Performed at [facility], [address] [place], [place] [address]    Report Status PENDING  Incomplete     Labs: BNP (last 3 results) No results for input(s): BNP in the last 8760 hours. Basic Metabolic Panel: Recent Labs   Lab 11/30/23 2126 12/01/23 0737 12/03/23 0843  NA 140 141 138  K 3.6 3.6 3.5  CL 107 113* 108  CO2 22 18* 22  GLUCOSE 119* 95 92  BUN 35* 28* 21  CREATININE 1.64* 1.21 1.32*  CALCIUM  9.4 8.5* 8.8*   Liver Function Tests: Recent Labs  Lab 11/30/23 2126  AST 34  ALT 27  ALKPHOS 98  BILITOT 0.6  PROT 7.1  ALBUMIN 3.5   Recent Labs  Lab 11/30/23 2126  LIPASE 34   No results for input(s): AMMONIA in the last 168 hours. CBC: Recent Labs  Lab 11/30/23 2126 12/01/23 0737 12/03/23 0843  WBC 11.6* 6.6 4.3  NEUTROABS 7.4  --  2.7  HGB 13.0 12.2* 11.1*  HCT 40.1 37.3* 33.0*  MCV 92.2 92.8 90.9  PLT 319 290 263   Cardiac Enzymes: No results for  input(s): CKTOTAL, CKMB, CKMBINDEX, TROPONINI in the last 168 hours. BNP: Invalid input(s): POCBNP CBG: No results for input(s): GLUCAP in the last 168 hours. D-Dimer No results for input(s): DDIMER in the last 72 hours. Hgb A1c No results for input(s): HGBA1C in the last 72 hours. Lipid Profile No results for input(s): CHOL, HDL, LDLCALC, TRIG, CHOLHDL, LDLDIRECT in the last 72 hours. Thyroid function studies No results for input(s): TSH, T4TOTAL, T3FREE, THYROIDAB in the last 72 hours.  Invalid input(s): FREET3 Anemia work up No results for input(s): VITAMINB12, FOLATE, FERRITIN, TIBC, IRON, RETICCTPCT in the last 72 hours. Urinalysis    Component Value Date/Time   COLORURINE YELLOW (A) 12/01/2023 0001   APPEARANCEUR TURBID (A) 12/01/2023 0001   APPEARANCEUR Cloudy (A) 10/17/2019 1042   LABSPEC 1.018 12/01/2023 0001   LABSPEC 1.010 07/12/2011 1236   PHURINE 5.0 12/01/2023 0001   GLUCOSEU NEGATIVE 12/01/2023 0001   GLUCOSEU Negative 07/12/2011 1236   HGBUR NEGATIVE 12/01/2023 0001   BILIRUBINUR NEGATIVE 12/01/2023 0001   BILIRUBINUR Negative 10/17/2019 1042   BILIRUBINUR Negative 07/12/2011 1236   KETONESUR NEGATIVE 12/01/2023 0001   PROTEINUR 100 (A)  12/01/2023 0001   NITRITE NEGATIVE 12/01/2023 0001   LEUKOCYTESUR MODERATE (A) 12/01/2023 0001   LEUKOCYTESUR Negative 07/12/2011 1236   Sepsis Labs Recent Labs  Lab 11/30/23 2126 12/01/23 0737 12/03/23 0843  WBC 11.6* 6.6 4.3   Microbiology Recent Results (from the past 240 hours)  Urine Culture (for pregnant, neutropenic or urologic patients or patients with an indwelling urinary catheter)     Status: Abnormal (Preliminary result)   Collection Time: 12/01/23 12:01 AM   Specimen: Urine, Random  Result Value Ref Range Status   Specimen Description   Final    URINE, RANDOM Performed at [facility], [address] [place], [place] [address]    Special Requests   Final    NONE Performed at [facility], [address], [place], [place] [address]    Culture (A)  Final    >=100,000 COLONIES/mL GRAM NEGATIVE RODS CULTURE REINCUBATED FOR BETTER GROWTH Performed at [facility], [address], [place], [place] [address]    Report Status PENDING  Incomplete  Blood Culture (routine x 2)     Status: None (Preliminary result)   Collection Time: 12/01/23  3:32 AM   Specimen: BLOOD  Result Value Ref Range Status   Specimen Description BLOOD BLOOD LEFT FOREARM  Final   Special Requests   Final    BOTTLES DRAWN AEROBIC AND ANAEROBIC Blood Culture results may not be optimal due to an inadequate volume of blood received in culture bottles   Culture   Final    NO GROWTH 2 DAYS Performed at [facility], [address] [place], [place] [address]    Report Status PENDING  Incomplete  Blood Culture (routine x 2)     Status: None (Preliminary result)   Collection Time: 12/01/23  3:32 AM   Specimen: BLOOD  Result Value Ref Range Status   Specimen Description BLOOD LEFT ANTECUBITAL  Final   Special Requests   Final    BOTTLES DRAWN AEROBIC AND ANAEROBIC Blood Culture results may not be optimal due to an inadequate volume of blood received in culture  bottles   Culture   Final    NO GROWTH 2 DAYS Performed at [facility], [address], [place], [place] [address]    Report Status PENDING  Incomplete     Time coordinating  discharge: 35 minutes   SIGNED:   Calvin KATHEE Robson, MD  [facility] 12/03/2023, 12:41 PM Pager   If 7PM-7AM, please contact night-coverage

## 2023-12-03 NOTE — Care Management Important Message (Signed)
 Important Message  Patient Details  Name: Matthew Greer MRN: [id number] Date of Birth: 23-Jul-1925   Important Message Given:  Yes - Medicare IM     Matthew Greer 12/03/2023, 11:55 AM

## 2023-12-03 NOTE — Evaluation (Signed)
 Physical Therapy Evaluation Patient Details Name: Matthew Greer MRN: [id number] DOB: May 06, 1926 Today's Date: 12/03/2023  History of Present Illness  Pt is a 89 y.o. male with medical history significant for essential hypertension and CVA with residual right-sided weakness, who presented to the emergency room with acute onset of lower abdominal pain with associated urinary frequency and urgency without dysuria or hematuria or flank pain.  MD assessment includes: sepsis due to gram-negative UTI, paroxysmal atrial fibrillation with RVR, and accelerated hypertension.   Clinical Impression  Pt was pleasant and motivated to participate during the session and put forth good effort throughout. Pt very HOH but with multi-modal cuing and very loud speaking into his R ear pt was able to follow commands well.  Pt with chronic R-sided weakness and ROM deficits on his R side with RUE more impacted than the RLE. Pt stated that he is non-ambulatory at baseline but does still perform stand/squat pivot transfers at home with significant caregiver assistance.  Pt was able to perform multiple sit to/from stands with +2 min-mod A from the recliner with multi-modal cues for proper sequencing.  Camie Ip lift then utilized for additional transfer training again with pt requiring +2 min-mod A and cuing to come to standing.  Pt was able to stand upright enough to easily get the [employer] under the pt.  Pt stated that he felt safe using the lift and that it would make toileting and other functional transfers easier and safer at home.  Pt will benefit from continued PT services upon discharge to safely address deficits listed in patient problem list for decreased caregiver assistance and eventual return to PLOF.          If plan is discharge home, recommend the following: A lot of help with walking and/or transfers;A lot of help with bathing/dressing/bathroom;Assistance with cooking/housework;Direct supervision/assist  for medications management;Assist for transportation;Help with stairs or ramp for entrance   Can travel by private vehicle        Equipment Recommendations Other (comment) Jayson Ip lift)  Recommendations for Other Services       Functional Status Assessment Patient has had a recent decline in their functional status and/or demonstrates limited ability to make significant improvements in function in a reasonable and predictable amount of time     Precautions / Restrictions Precautions Precautions: Fall Restrictions Weight Bearing Restrictions Per Provider Order: No      Mobility  Bed Mobility               General bed mobility comments: NT, pt in recliner pre/post session    Transfers Overall transfer level: Needs assistance Equipment used: 1 person hand held assist Transfers: Sit to/from Stand Sit to Stand: Min assist, +2 physical assistance           General transfer comment: Multiple transfers performed from recliner both with Camie Ip lift and with HHA; +2 min to mod A and cues for sequencing for all transfers Transfer via Lift Equipment: Stedy  Ambulation/Gait               General Gait Details: NT, non-ambulatory at baseline  Stairs            Wheelchair Mobility     Tilt Bed    Modified Rankin (Stroke Patients Only)       Balance Overall balance assessment: Needs assistance         Standing balance support: Single extremity supported, During functional activity Standing balance-Leahy Scale: Poor  Pertinent Vitals/Pain Pain Assessment Pain Assessment: No/denies pain    Home Living Family/patient expects to be discharged to:: Private residence Living Arrangements: Spouse/significant other Available Help at Discharge: Family;Available 24 hours/day Type of Home: House Home Access: Ramped entrance       Home Layout: One level Home Equipment: Agricultural consultant (2 wheels);Cane -  single point;Shower seat Additional Comments: Pt VERY hard of hearing.  History obtained from the pt as able but supplemented with chart review.  Unable to reach caregiver by phone.    Prior Function Prior Level of Function : Needs assist             Mobility Comments: Pt reported being non-ambulatory and needing right much assistance with stand/squat pivot transfers ADLs Comments: Assist needed for all ADLs     Extremity/Trunk Assessment   Upper Extremity Assessment Upper Extremity Assessment: Generalized weakness;RUE deficits/detail RUE Deficits / Details: Chronic Rigidity/Spasticity from prior CVA    Lower Extremity Assessment Lower Extremity Assessment: RLE deficits/detail;Generalized weakness RLE Deficits / Details: Chronic weakness from prior CVA       Communication   Communication Factors Affecting Communication: Hearing impaired    Cognition Arousal: Alert Behavior During Therapy: WFL for tasks assessed/performed   PT - Cognitive impairments: No apparent impairments                         Following commands: Intact       Cueing Cueing Techniques: Gestural cues, Verbal cues, Tactile cues, Visual cues     General Comments      Exercises Total Joint Exercises Ankle Circles/Pumps: AROM, Strengthening, Both, 10 reps Quad Sets: Strengthening, Both, 10 reps, AROM Long Arc Quad: AROM, Strengthening, Both, 10 reps Knee Flexion: AROM, Strengthening, Both, 10 reps   Assessment/Plan    PT Assessment Patient needs continued PT services  PT Problem List Decreased strength;Decreased activity tolerance;Decreased balance;Decreased mobility;Decreased knowledge of use of DME       PT Treatment Interventions DME instruction;Functional mobility training;Therapeutic activities;Therapeutic exercise;Balance training;Patient/family education    PT Goals (Current goals can be found in the Care Plan section)  Acute Rehab PT Goals Patient Stated Goal:  Improved strength and ability to transfer PT Goal Formulation: With patient Time For Goal Achievement: 12/16/23 Potential to Achieve Goals: Fair    Frequency Min 1X/week     Co-evaluation               AM-PAC PT 6 Clicks Mobility  Outcome Measure Help needed turning from your back to your side while in a flat bed without using bedrails?: A Lot Help needed moving from lying on your back to sitting on the side of a flat bed without using bedrails?: A Lot Help needed moving to and from a bed to a chair (including a wheelchair)?: A Lot Help needed standing up from a chair using your arms (e.g., wheelchair or bedside chair)?: A Lot Help needed to walk in hospital room?: Total Help needed climbing 3-5 steps with a railing? : Total 6 Click Score: 10    End of Session Equipment Utilized During Treatment: Gait belt Activity Tolerance: Patient tolerated treatment well Patient left: in chair;with call bell/phone within reach;with chair alarm set Nurse Communication: Mobility status PT Visit Diagnosis: Muscle weakness (generalized) (M62.81)    Time: 8955-8892 PT Time Calculation (min) (ACUTE ONLY): 23 min   Charges:   PT Evaluation $PT Eval Moderate Complexity: 1 Mod PT Treatments $Therapeutic Activity: 8-22 mins PT General Charges $$  ACUTE PT VISIT: 1 Visit       D. Scott Nikodem Leadbetter PT, DPT 12/03/23, 11:45 AM

## 2023-12-04 LAB — URINE CULTURE: Culture: >=100000 — AB

## 2023-12-06 LAB — CULTURE, BLOOD (ROUTINE X 2)
Culture: NO GROWTH
Culture: NO GROWTH

## 2023-12-14 ENCOUNTER — Other Ambulatory Visit: Payer: Self-pay

## 2023-12-14 ENCOUNTER — Emergency Department
Admission: EM | Admit: 2023-12-14 | Discharge: 2023-12-14 | Disposition: A | Discharge status: Home / Self Care | Attending: Emergency Medicine | Admitting: Emergency Medicine

## 2023-12-14 ENCOUNTER — Emergency Department

## 2023-12-14 DIAGNOSIS — M25552 Pain in left hip: Secondary | ICD-10-CM | POA: Diagnosis not present

## 2023-12-14 DIAGNOSIS — I10 Essential (primary) hypertension: Secondary | ICD-10-CM | POA: Insufficient documentation

## 2023-12-14 DIAGNOSIS — M25512 Pain in left shoulder: Secondary | ICD-10-CM | POA: Diagnosis present

## 2023-12-14 DIAGNOSIS — R7309 Other abnormal glucose: Secondary | ICD-10-CM | POA: Insufficient documentation

## 2023-12-14 LAB — COMPREHENSIVE METABOLIC PANEL WITH GFR
ALT: 40 U/L (ref 0–44)
AST: 38 U/L (ref 15–41)
Albumin: 2.9 g/dL — ABNORMAL LOW (ref 3.5–5.0)
Alkaline Phosphatase: 102 U/L (ref 38–126)
Anion gap: 12 (ref 5–15)
BUN: 33 mg/dL — ABNORMAL HIGH (ref 8–23)
CO2: 19 mmol/L — ABNORMAL LOW (ref 22–32)
Calcium: 9.1 mg/dL (ref 8.9–10.3)
Chloride: 104 mmol/L (ref 98–111)
Creatinine, Ser: 1.29 mg/dL — ABNORMAL HIGH (ref 0.61–1.24)
GFR, Estimated: 51 mL/min — ABNORMAL LOW (ref >60)
Glucose, Bld: 127 mg/dL — ABNORMAL HIGH (ref 70–99)
Potassium: 3.2 mmol/L — ABNORMAL LOW (ref 3.5–5.1)
Sodium: 135 mmol/L (ref 135–145)
Total Bilirubin: 1.4 mg/dL — ABNORMAL HIGH (ref 0.0–1.2)
Total Protein: 6.3 g/dL — ABNORMAL LOW (ref 6.5–8.1)

## 2023-12-14 LAB — CBC
HCT: 35.4 % — ABNORMAL LOW (ref 39.0–52.0)
Hemoglobin: 12 g/dL — ABNORMAL LOW (ref 13.0–17.0)
MCH: 30.1 pg (ref 26.0–34.0)
MCHC: 33.9 g/dL (ref 30.0–36.0)
MCV: 88.7 fL (ref 80.0–100.0)
Platelets: 399 K/uL (ref 150–400)
RBC: 3.99 MIL/uL — ABNORMAL LOW (ref 4.22–5.81)
RDW: 13.3 % (ref 11.5–15.5)
WBC: 12.7 K/uL — ABNORMAL HIGH (ref 4.0–10.5)
nRBC: 0 % (ref 0.0–0.2)

## 2023-12-14 LAB — URINALYSIS, ROUTINE W REFLEX MICROSCOPIC
Bilirubin Urine: NEGATIVE
Glucose, UA: NEGATIVE mg/dL
Ketones, ur: NEGATIVE mg/dL
Nitrite: NEGATIVE
Protein, ur: 30 mg/dL — AB
Specific Gravity, Urine: 1.019 (ref 1.005–1.030)
pH: 5 (ref 5.0–8.0)

## 2023-12-14 MED ORDER — OXYCODONE-ACETAMINOPHEN 5-325 MG PO TABS
1.0000 | ORAL_TABLET | Freq: Once | ORAL | Status: AC
  Administered 2023-12-14: 1 via ORAL
  Filled 2023-12-14: qty 1

## 2023-12-14 NOTE — Discharge Instructions (Signed)
 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  No signs of infection in the urine and your laboratory workup elsewise is reassuring today.  Please make sure you are eating and drinking adequate amounts at home and follow-up with your primary care provider for reassessment soon.  Please return for any severe or worsening symptoms.

## 2023-12-14 NOTE — ED Notes (Signed)
 Was able to contact and give report to Avanell Montclair, patient's spouse and legal guardian.

## 2023-12-14 NOTE — ED Notes (Signed)
Pt sleep at this time.  

## 2023-12-14 NOTE — ED Notes (Signed)
 Patient's wife called and stated that patient self-caths 3 times a day.

## 2023-12-14 NOTE — ED Notes (Signed)
 [facility] called for transport to residence

## 2023-12-14 NOTE — ED Notes (Signed)
 Patient taken to imaging.

## 2023-12-14 NOTE — ED Provider Notes (Signed)
 [facility] Provider Note    Event Date/Time   First MD Initiated Contact with Patient 12/14/23 1457     (approximate)   History   Weakness   HPI Matthew Greer is a 89 y.o. male with history of HTN, HLD, GERD, prior CVA with right-sided weakness presenting today for pain in his shoulder and hip.  Patient reports for several days he has had pain in his left shoulder as well as his left hip.  Denies falling out of the bed.  Cannot fully say when it actually started.  Patient was sleeping when I came into the room.  Denies any injury elsewhere.  Reportedly EMS noted that he was also having darker urine and was diagnosed with a UTI back in June.  He denies any urinary complaints, chest pain, shortness of breath, abdominal pain, nausea, vomiting.     Physical Exam   Triage Vital Signs: ED Triage Vitals  Encounter Vitals Group     BP 12/14/23 1224 114/60     Girls Systolic BP Percentile --      Girls Diastolic BP Percentile --      Boys Systolic BP Percentile --      Boys Diastolic BP Percentile --      Pulse Rate 12/14/23 1224 90     Resp 12/14/23 1224 16     Temp 12/14/23 1224 98.2 F (36.8 C)     Temp src --      SpO2 12/14/23 1224 98 %     Weight --      Height --      Head Circumference --      Peak Flow --      Pain Score 12/14/23 1225 5     Pain Loc --      Pain Education --      Exclude from Growth Chart --     Most recent vital signs: Vitals:   12/14/23 1630 12/14/23 1730  BP: (!) 110/59   Pulse: 87 91  Resp:    Temp:    SpO2: 97% 97%   I have reviewed the vital signs. General:  Awake, alert, no acute distress. Head:  Normocephalic, Atraumatic. EENT:  PERRL, EOMI, Oral mucosa pink and moist, Neck is supple. Cardiovascular: Regular rate, 2+ distal pulses. Respiratory:  Normal respiratory effort, symmetrical expansion, no distress.   Extremities: No specific tenderness palpation over left shoulder or left hip.  Nor with any range  of motion of those extremities.  No obvious deformity. Neuro:  Alert and oriented.  Right-sided hemiparesis from prior CVA.   Skin:  Warm, dry, no rash.   Psych: Appropriate affect.    ED Results / Procedures / Treatments   Labs (all labs ordered are listed, but only abnormal results are displayed) Labs Reviewed  COMPREHENSIVE METABOLIC PANEL WITH GFR - Abnormal; Notable for the following components:      Result Value   Potassium 3.2 (*)    CO2 19 (*)    Glucose, Bld 127 (*)    BUN 33 (*)    Creatinine, Ser 1.29 (*)    Total Protein 6.3 (*)    Albumin 2.9 (*)    Total Bilirubin 1.4 (*)    GFR, Estimated 51 (*)    All other components within normal limits  CBC - Abnormal; Notable for the following components:   WBC 12.7 (*)    RBC 3.99 (*)    Hemoglobin 12.0 (*)    HCT  35.4 (*)    All other components within normal limits  URINALYSIS, ROUTINE W REFLEX MICROSCOPIC - Abnormal; Notable for the following components:   Color, Urine YELLOW (*)    APPearance HAZY (*)    Hgb urine dipstick SMALL (*)    Protein, ur 30 (*)    Leukocytes,Ua TRACE (*)    Bacteria, UA RARE (*)    All other components within normal limits  CBG MONITORING, ED     EKG My EKG interpretation: Rate of 90, normal sinus rhythm, normal axis, normal intervals.  No acute ST elevations or depressions   RADIOLOGY Independently interpreted x-rays of chest, left shoulder, and left hip with no acute pathology outside of arthritis   PROCEDURES:  Critical Care performed: No  Procedures   MEDICATIONS ORDERED IN ED: Medications  oxyCODONE -acetaminophen  (PERCOCET/ROXICET) 5-325 MG per tablet 1 tablet (1 tablet Oral Given 12/14/23 1630)     IMPRESSION / MDM / ASSESSMENT AND PLAN / ED COURSE  I reviewed the triage vital signs and the nursing notes.                              Differential diagnosis includes, but is not limited to, arthritis, unwitnessed traumatic event to left shoulder left humerus  resulting in humeral fracture, distal clavicle fracture, proximal femur fracture  Patient's presentation is most consistent with acute complicated illness / injury requiring diagnostic workup.  Patient is a 89 year old male presenting today for pain in his left shoulder and left hip.  No obvious tenderness palpation in any of these regions although exam somewhat limited given patient's prior CVA history.  Will get x-rays to evaluate for any traumatic injuries although patient is bedbound and denies any falls out of the bed.  Vital signs otherwise stable.  Separately there was concern he may have ongoing UTI and so we will collect a UA for further evaluation.  Independently evaluated x-rays of left hip, left shoulder, and chest x-ray with no acute findings outside of arthritis.  UA does not show any signs of UTI.  Otherwise vital signs are stable and patient with no complaints on reassessment.  Suspect chronic arthritis as a source of his pain but otherwise stable for discharge with no other acute findings here today.  The patient is on the cardiac monitor to evaluate for evidence of arrhythmia and/or significant heart rate changes. Clinical Course as of 12/14/23 1811  Fri Dec 14, 2023  1805 Urinalysis, Routine w reflex microscopic -Urine, Clean Catch(!) No UTI [DW]    Clinical Course User Index [DW] Malvina Alm DASEN, MD     FINAL CLINICAL IMPRESSION(S) / ED DIAGNOSES   Final diagnoses:  Acute pain of left shoulder  Left hip pain     Rx / DC Orders   ED Discharge Orders     None        Note:  This document was prepared using Dragon voice recognition software and may include unintentional dictation errors.   Malvina Alm DASEN, MD 12/14/23 [phone]

## 2023-12-14 NOTE — ED Notes (Signed)
 Patient stated that he had to be cath today at home in order to urinate. Dr. Malvina aware.

## 2023-12-14 NOTE — ED Notes (Signed)
 This writer attempted to call emergency contact which is patient's wife and was unsuccessful. Will try again.

## 2023-12-14 NOTE — ED Triage Notes (Addendum)
 Pt to ED via [facility] from home. Pt reports left shoulder and left knee pain. Pt reports in June dx with UTI and reports urine is still dark. Pt is bed bound. Pt with hx of stroke with right sided deficits. Pt does not have hearing aides in and is very HOH   120/64 HR 91 96% RA

## 2024-03-05 DEATH — deceased
